# Patient Record
Sex: Female | Born: 1935 | Race: White | Hispanic: No | Smoking: Never smoker
Health system: Southern US, Community
[De-identification: ages and names within clinical notes are randomized; demographics above are authoritative.]

## PROBLEM LIST (undated history)

## (undated) DIAGNOSIS — I10 Essential (primary) hypertension: Secondary | ICD-10-CM

## (undated) DIAGNOSIS — Z87442 Personal history of urinary calculi: Secondary | ICD-10-CM

## (undated) DIAGNOSIS — K219 Gastro-esophageal reflux disease without esophagitis: Secondary | ICD-10-CM

## (undated) DIAGNOSIS — M1990 Unspecified osteoarthritis, unspecified site: Secondary | ICD-10-CM

## (undated) DIAGNOSIS — C801 Malignant (primary) neoplasm, unspecified: Secondary | ICD-10-CM

## (undated) HISTORY — PX: GALLBLADDER SURGERY: SHX652

## (undated) HISTORY — DX: Malignant (primary) neoplasm, unspecified: C80.1

## (undated) HISTORY — DX: Essential (primary) hypertension: I10

## (undated) HISTORY — PX: OTHER SURGICAL HISTORY: SHX169

## (undated) HISTORY — PX: ABDOMINAL HYSTERECTOMY: SHX81

## (undated) HISTORY — PX: HERNIA REPAIR: SHX51

## (undated) HISTORY — PX: CHOLECYSTECTOMY: SHX55

## (undated) HISTORY — DX: Gastro-esophageal reflux disease without esophagitis: K21.9

## (undated) HISTORY — DX: Unspecified osteoarthritis, unspecified site: M19.90

---

## 1988-01-19 HISTORY — PX: FRACTURE SURGERY: SHX138

## 1994-01-18 DIAGNOSIS — C801 Malignant (primary) neoplasm, unspecified: Secondary | ICD-10-CM

## 1994-01-18 HISTORY — DX: Malignant (primary) neoplasm, unspecified: C80.1

## 2004-09-24 ENCOUNTER — Ambulatory Visit: Payer: Self-pay | Admitting: Family Medicine

## 2004-09-30 ENCOUNTER — Ambulatory Visit: Payer: Self-pay | Admitting: Family Medicine

## 2004-10-14 ENCOUNTER — Ambulatory Visit: Payer: Self-pay | Admitting: Family Medicine

## 2005-01-01 ENCOUNTER — Ambulatory Visit: Payer: Self-pay | Admitting: Family Medicine

## 2005-01-04 ENCOUNTER — Ambulatory Visit: Payer: Self-pay | Admitting: Family Medicine

## 2005-03-08 ENCOUNTER — Ambulatory Visit: Payer: Self-pay | Admitting: Cardiology

## 2005-03-23 ENCOUNTER — Ambulatory Visit: Payer: Self-pay | Admitting: Family Medicine

## 2005-06-15 ENCOUNTER — Ambulatory Visit: Payer: Self-pay | Admitting: Family Medicine

## 2005-10-13 ENCOUNTER — Ambulatory Visit: Payer: Self-pay | Admitting: Family Medicine

## 2006-01-26 ENCOUNTER — Ambulatory Visit: Payer: Self-pay | Admitting: Family Medicine

## 2006-02-28 ENCOUNTER — Ambulatory Visit: Payer: Self-pay | Admitting: Family Medicine

## 2006-03-08 ENCOUNTER — Ambulatory Visit (HOSPITAL_COMMUNITY): Admission: RE | Admit: 2006-03-08 | Discharge: 2006-03-08 | Payer: Self-pay | Admitting: Family Medicine

## 2006-03-23 ENCOUNTER — Ambulatory Visit: Payer: Self-pay | Admitting: Family Medicine

## 2011-08-10 HISTORY — PX: OTHER SURGICAL HISTORY: SHX169

## 2011-08-11 ENCOUNTER — Encounter: Payer: Self-pay | Admitting: Cardiology

## 2011-08-26 ENCOUNTER — Encounter: Payer: Self-pay | Admitting: Cardiology

## 2011-08-27 ENCOUNTER — Ambulatory Visit: Payer: Self-pay | Admitting: Cardiology

## 2011-08-27 ENCOUNTER — Telehealth: Payer: Self-pay

## 2011-08-27 ENCOUNTER — Other Ambulatory Visit: Payer: Self-pay | Admitting: Cardiology

## 2011-08-27 ENCOUNTER — Encounter: Payer: Self-pay | Admitting: Cardiology

## 2011-08-27 ENCOUNTER — Encounter: Payer: Self-pay | Admitting: *Deleted

## 2011-08-27 ENCOUNTER — Ambulatory Visit (INDEPENDENT_AMBULATORY_CARE_PROVIDER_SITE_OTHER): Payer: Medicare Other | Admitting: Cardiology

## 2011-08-27 VITALS — BP 153/83 | HR 67 | Ht 65.0 in | Wt 220.0 lb

## 2011-08-27 DIAGNOSIS — R0602 Shortness of breath: Secondary | ICD-10-CM

## 2011-08-27 DIAGNOSIS — R079 Chest pain, unspecified: Secondary | ICD-10-CM

## 2011-08-27 DIAGNOSIS — R072 Precordial pain: Secondary | ICD-10-CM

## 2011-08-27 DIAGNOSIS — K219 Gastro-esophageal reflux disease without esophagitis: Secondary | ICD-10-CM

## 2011-08-27 DIAGNOSIS — I10 Essential (primary) hypertension: Secondary | ICD-10-CM

## 2011-08-27 NOTE — Assessment & Plan Note (Signed)
Blood pressure is elevated today. She reports compliance with her medications. Keep regular followup with Dr. Lysbeth Galas.

## 2011-08-27 NOTE — Telephone Encounter (Signed)
Message copied by Carmelina Paddock on Fri Aug 27, 2011  9:19 AM ------      Message from: Jonelle Sidle      Created: Fri Aug 27, 2011  9:16 AM      Regarding: RE: [name]       We can start with a plain treadmill and go from there.            ----- Message -----         From: Carmelina Paddock         Sent: 08/27/2011   9:11 AM           To: Jonelle Sidle, MD      Subject: [name]                                                  Needs MD review for myoview due to normal EKG.              They are suggesting a plain treadmill.            Case will close Monday.              [phone] option 4      Case # [id number]      Anna Bradford August 12, 2033            Thanks,            Morrie Sheldon

## 2011-08-27 NOTE — Assessment & Plan Note (Signed)
Described above, also associated with shortness of breath with activity. Symptoms have been present intermittently over the last month. There are both typical and atypical features, still concern for progressive angina. Her risk factors include a family history of premature CAD in her mother, also personal history of hypertension. She has not had any ischemic workup in the last 6 years. Her ECG today is normal. Plan will be to pursue an exercise Myoview and inform her of the results. No changes to medications at this point.

## 2011-08-27 NOTE — Telephone Encounter (Signed)
Message copied by Eustace Moore on Fri Aug 27, 2011 10:19 AM ------      Message from: Zachary George T      Created: Fri Aug 27, 2011  9:42 AM      Regarding: FWCeline Ahr       We need to change this order  And notify the patient.      ----- Message -----         From: Carmelina Paddock         Sent: 08/27/2011   9:18 AM           To: Megan Salon      Subject: FW: myoview                                              Can you change to a plain treadmill per Dr. Diona Browner?      I will update phone note as well            Thanks,            Morrie Sheldon            ----- Message -----         From: Jonelle Sidle, MD         Sent: 08/27/2011   9:16 AM           To: Carmelina Paddock      Subject: RE: [name]                                              We can start with a plain treadmill and go from there.            ----- Message -----         From: Carmelina Paddock         Sent: 08/27/2011   9:11 AM           To: Jonelle Sidle, MD      Subject: [name]                                                  Needs MD review for myoview due to normal EKG.              They are suggesting a plain treadmill.            Case will close Monday.              [phone] option 4      Case # [id number]      Monzerat Handler 05-16-2033            Thanks,            Morrie Sheldon

## 2011-08-27 NOTE — Telephone Encounter (Signed)
Rest Stress Myoview set for 08-31-2011 [facility] Checking percert

## 2011-08-27 NOTE — Progress Notes (Signed)
Clinical Summary Anna Bradford is a 78 y.o.female referred for cardiology consultation by Dr. Lysbeth Galas. She reports a history of recurring chest pain since July 15 when she woke up at night. She describes a feeling of tightness in her chest, radiation to the arms and neck, lasted for several minutes. Since then she has had recurrent symptoms to milder degree, also dyspnea on exertion. Still describes herself as being active, she babysits full time, also enjoys working out doors, doing light carpentry work, also plowing with a IT trainer.  Exercise Cardiolite from 2007 showed no diagnostic ST segment changes with inferior attenuation, no definite ischemia, LVEF 72%. She has had no ischemic testing over the last 6 years.  ECG today is normal. Main cardiac risk factors include family history of premature CAD in her mother, also hypertension. Some features of her symptoms seem suggestive of possible progressive reflux or esophageal spasm. She uses TUMS at home.   Allergies  Allergen Reactions  . Codeine Nausea And Vomiting  . Sulfa Antibiotics Nausea And Vomiting    Current Outpatient Prescriptions  Medication Sig Dispense Refill  . albuterol (PROAIR HFA) 108 (90 BASE) MCG/ACT inhaler Inhale 2 puffs into the lungs every 4 (four) hours as needed.      . Ascorbic Acid (VITAMIN C) 1000 MG tablet Take 1,000 mg by mouth daily.      Marland Kitchen Besifloxacin HCl (BESIVANCE) 0.6 % SUSP Place 1 drop into the left eye 2 (two) times daily.      . bimatoprost (LUMIGAN) 0.01 % SOLN Place 1 drop into the left eye at bedtime.      . brimonidine-timolol (COMBIGAN) 0.2-0.5 % ophthalmic solution Place 1 drop into the left eye every 12 (twelve) hours.      . Bromfenac Sodium (PROLENSA) 0.07 % SOLN Place 1 drop into the left eye daily.      . DiphenhydrAMINE HCl (BENADRYL ALLERGY PO) Take by mouth as needed.      Marland Kitchen ibuprofen (ADVIL,MOTRIN) 200 MG tablet Take 200 mg by mouth every 6 (six) hours as needed.      Marland Kitchen  losartan-hydrochlorothiazide (HYZAAR) 100-25 MG per tablet Take 1 tablet by mouth daily.      . Multiple Minerals-Vitamins (CALCIUM-MAGNESIUM-ZINC-D3 PO) Take 1 tablet by mouth daily.      . Multiple Vitamin (MULTIVITAMIN) tablet Take 1 tablet by mouth daily.      . potassium gluconate 595 MG TABS Take 595 mg by mouth daily.      . vitamin E 400 UNIT capsule Take 400 Units by mouth daily.        Past Medical History  Diagnosis Date  . Essential hypertension, benign   . GERD (gastroesophageal reflux disease)     Past Surgical History  Procedure Date  . Gallbladder surgery     Rupture Lower Stomach  . Hysterectomy ---- unknown   . Lapoma tumor   . Right ankle surgery   . Left breast masectomy   . Hernia repair   . Absess off intestine     Family History  Problem Relation Age of Onset  . Cancer Mother   . Coronary artery disease Mother   . Coronary artery disease Brother     Social History [name] reports that she has never smoked. She has never used smokeless tobacco. [name] reports that she does not drink alcohol.  Review of Systems No palpitations or syncope. No reported bleeding problems. Good appetite. Sometimes feels short of breath after eating. No orthopnea  or PND. Varicose veins. Had recent cataract surgery. Otherwise negative.  Physical Examination Filed Vitals:   08/27/11 0810  BP: 153/83  Pulse: 67   Overweight woman in no acute distress. HEENT: Conjunctiva and lids normal, oropharynx clear. Neck: Supple, increased girth, no elevated JVP or carotid bruits, no thyromegaly. Lungs: Clear to auscultation, nonlabored breathing at rest. Cardiac: Regular rate and rhythm, no S3, soft systolic murmur, no pericardial rub. Abdomen: Soft, nontender, protuberant, bowel sounds present, no guarding or rebound. Extremities: No pitting edema, varicose veins, distal pulses 2+. Skin: Warm and dry. Musculoskeletal: No kyphosis. Neuropsychiatric: Alert and oriented x3,  affect grossly appropriate.   ECG Reviewed in EMR.   Problem List and Plan   Precordial pain Described above, also associated with shortness of breath with activity. Symptoms have been present intermittently over the last month. There are both typical and atypical features, still concern for progressive angina. Her risk factors include a family history of premature CAD in her mother, also personal history of hypertension. She has not had any ischemic workup in the last 6 years. Her ECG today is normal. Plan will be to pursue an exercise Myoview and inform her of the results. No changes to medications at this point.  Essential hypertension, benign Blood pressure is elevated today. She reports compliance with her medications. Keep regular followup with Dr. Lysbeth Galas.  GERD (gastroesophageal reflux disease) History of reflux, uses TUMS. If stress testing is normal, may consider empiric trial of PPI for more aggressive acid suppression, in case some of her symptoms are related to esophagitis or spasm.    Jonelle Sidle, M.D., F.A.C.C.

## 2011-08-27 NOTE — Assessment & Plan Note (Signed)
History of reflux, uses TUMS. If stress testing is normal, may consider empiric trial of PPI for more aggressive acid suppression, in case some of her symptoms are related to esophagitis or spasm.

## 2011-08-27 NOTE — Patient Instructions (Addendum)
Your physician has requested that you have en exercise stress myoview. For further information please visit [email or web address]. Please follow instruction sheet, as given. Your physician recommends that you continue on your current medications as directed. Please refer to the Current Medication list given to you today. We will call you with your results. 

## 2011-08-27 NOTE — Telephone Encounter (Signed)
I sent a staff message to Dr. Diona Browner.  [employer] is suggesting a plain treadmill due to her normal EKG.   Waiting to hear back to see what he would like to do.

## 2011-08-27 NOTE — Telephone Encounter (Signed)
@  10:19 am message left on voicemail of patient cell phone that order changed due to insurance request.

## 2011-08-31 DIAGNOSIS — R079 Chest pain, unspecified: Secondary | ICD-10-CM

## 2011-09-03 ENCOUNTER — Telehealth: Payer: Self-pay | Admitting: *Deleted

## 2011-09-03 NOTE — Telephone Encounter (Signed)
Message copied by Eustace Moore on Fri Sep 03, 2011 11:25 AM ------      Message from: MCDOWELL, Illene Bolus      Created: Wed Sep 01, 2011  9:12 PM       Reviewed report. She had very limited exercise tolerance, but no definite ischemic ST change at 85% MPHR. Let's have her try a PPI such as Prilosec or Protonix emperically, but keep a followup visit with me in the next 2-4 weeks. I am concerned that if her symptoms continue, we may need to pursue further cardiac evaluation anyway.

## 2011-09-03 NOTE — Telephone Encounter (Signed)
Left message for patient to call office. Spoke with a female.

## 2011-09-03 NOTE — Telephone Encounter (Signed)
Patient informed and is going to start omeprazole 20 mg daily OTC and f/u on 09/28/11.

## 2011-09-28 ENCOUNTER — Ambulatory Visit: Payer: Medicare Other | Admitting: Cardiology

## 2011-11-01 HISTORY — PX: OTHER SURGICAL HISTORY: SHX169

## 2013-01-10 ENCOUNTER — Encounter: Payer: Self-pay | Admitting: Cardiology

## 2013-01-15 ENCOUNTER — Ambulatory Visit: Payer: Medicare Other | Admitting: Cardiology

## 2013-01-15 ENCOUNTER — Other Ambulatory Visit: Payer: Self-pay | Admitting: *Deleted

## 2013-01-15 ENCOUNTER — Ambulatory Visit (INDEPENDENT_AMBULATORY_CARE_PROVIDER_SITE_OTHER): Payer: Medicare Other | Admitting: Cardiology

## 2013-01-15 VITALS — BP 149/76 | HR 77 | Ht 65.0 in | Wt 221.8 lb

## 2013-01-15 DIAGNOSIS — I10 Essential (primary) hypertension: Secondary | ICD-10-CM

## 2013-01-15 DIAGNOSIS — R0789 Other chest pain: Secondary | ICD-10-CM

## 2013-01-15 DIAGNOSIS — I83893 Varicose veins of bilateral lower extremities with other complications: Secondary | ICD-10-CM

## 2013-01-15 NOTE — Progress Notes (Signed)
Clinical Summary Anna Bradford is a 79 y.o.female previously seen by Dr Diona Browner, this is our first visit together. She was seen for the following medical problems.  1. Chest pain - exercise cardiolite 2007 without definite ischemia, LVEF 72% - exercise stress test 08/2011 patient exercised 1 min 32 seconds, 4.6 METs, no ischemic EKG changes but did have decreased exercise tolerance per report. She states the test was stopped because she reached her target heart rate, not due to fatigue. - feeling of occasional pressure in abdomen, soreness in abdomen. No cardiac like chest pain. Describes DOE walking back and forth to mailbox which is approx 100 feet, stable for 6 months. No orthopnea, no LE edema.   2. HTN - does not check at home - compliant with hyzaar   Past Medical History  Diagnosis Date  . Essential hypertension, benign   . GERD (gastroesophageal reflux disease)      Allergies  Allergen Reactions  . Codeine Nausea And Vomiting  . Sulfa Antibiotics Nausea And Vomiting     Current Outpatient Prescriptions  Medication Sig Dispense Refill  . albuterol (PROAIR HFA) 108 (90 BASE) MCG/ACT inhaler Inhale 2 puffs into the lungs every 4 (four) hours as needed.      . Ascorbic Acid (VITAMIN C) 1000 MG tablet Take 1,000 mg by mouth daily.      Marland Kitchen Besifloxacin HCl (BESIVANCE) 0.6 % SUSP Place 1 drop into the left eye 2 (two) times daily.      . bimatoprost (LUMIGAN) 0.01 % SOLN Place 1 drop into the left eye at bedtime.      . brimonidine-timolol (COMBIGAN) 0.2-0.5 % ophthalmic solution Place 1 drop into the left eye every 12 (twelve) hours.      . Bromfenac Sodium (PROLENSA) 0.07 % SOLN Place 1 drop into the left eye daily.      . DiphenhydrAMINE HCl (BENADRYL ALLERGY PO) Take by mouth as needed.      Marland Kitchen ibuprofen (ADVIL,MOTRIN) 200 MG tablet Take 200 mg by mouth every 6 (six) hours as needed.      Marland Kitchen losartan-hydrochlorothiazide (HYZAAR) 100-25 MG per tablet Take 1 tablet by mouth  daily.      . Multiple Minerals-Vitamins (CALCIUM-MAGNESIUM-ZINC-D3 PO) Take 1 tablet by mouth daily.      . Multiple Vitamin (MULTIVITAMIN) tablet Take 1 tablet by mouth daily.      Marland Kitchen omeprazole (PRILOSEC) 20 MG capsule Take 20 mg by mouth daily.      . potassium gluconate 595 MG TABS Take 595 mg by mouth daily.      . vitamin E 400 UNIT capsule Take 400 Units by mouth daily.       No current facility-administered medications for this visit.     Past Surgical History  Procedure Laterality Date  . Gallbladder surgery      Rupture Lower Stomach  . Hysterectomy ---- unknown    . Lapoma tumor    . Right ankle surgery    . Left breast masectomy    . Hernia repair    . Absess off intestine       Allergies  Allergen Reactions  . Codeine Nausea And Vomiting  . Sulfa Antibiotics Nausea And Vomiting      Family History  Problem Relation Age of Onset  . Cancer Mother   . Coronary artery disease Mother   . Coronary artery disease Brother      Social History Ms. Camargo reports that she has never smoked.  She has never used smokeless tobacco. Ms. Parillo reports that she does not drink alcohol.   Review of Systems CONSTITUTIONAL: No weight loss, fever, chills, weakness or fatigue.  HEENT: Eyes: No visual loss, blurred vision, double vision or yellow sclerae.No hearing loss, sneezing, congestion, runny nose or sore throat.  SKIN: No rash or itching.  CARDIOVASCULAR: per HPI RESPIRATORY: No shortness of breath, cough or sputum.  GASTROINTESTINAL: occas abdominal pain GENITOURINARY: No burning on urination, no polyuria NEUROLOGICAL: No headache, dizziness, syncope, paralysis, ataxia, numbness or tingling in the extremities. No change in bowel or bladder control.  MUSCULOSKELETAL: No muscle, back pain, joint pain or stiffness.  LYMPHATICS: No enlarged nodes. No history of splenectomy.  PSYCHIATRIC: No history of depression or anxiety.  ENDOCRINOLOGIC: No reports of sweating, cold or  heat intolerance. No polyuria or polydipsia.  Marland Kitchen   Physical Examination p 77 bp 145/70 Wt 221 lbs BMI 37 Gen: resting comfortably, no acute distress HEENT: no scleral icterus, pupils equal round and reactive, no palptable cervical adenopathy,  CV: RRR, no m/r/g, no JVD, no carotid bruits Resp: Clear to auscultation bilaterally GI: abdomen is soft, non-tender, non-distended, normal bowel sounds, no hepatosplenomegaly MSK: extremities are warm, no edema.  Skin: warm, no rash Neuro:  no focal deficits Psych: appropriate affect   Diagnostic Studies 08/2011 GXT: 1 min 32 sec, stage 1, 4.6 METs, no ischemic changes    Assessment and Plan  1. Atypical chest pain - negative exercise stress test recently. Symptoms more consistent with GI etiology - continue cardiac risk factor modification  2. HTN - blood pressure at reasonable target today give her age and recent guidelines for bp control in the elderly, she was counseled on further lifestyle modification including sodium limitation and she was given information on the DASH diet. - follow up bp at future visits.    Follow up 1 year   Antoine Poche, M.D., F.A.C.C.

## 2013-01-15 NOTE — Patient Instructions (Signed)
Your physician recommends that you schedule a follow-up appointment in: 1 year with Dr. Wyline Mood. You should receive a letter in the mail in 10 months. If you do not receive this letter by October 2015 call our office to schedule this appointment.   Your physician recommends that you continue on your current medications as directed. Please refer to the Current Medication list given to you today.  Your physician has recommended you try the DASH Diet.

## 2013-03-06 ENCOUNTER — Encounter: Payer: Medicare Other | Admitting: Vascular Surgery

## 2013-03-06 ENCOUNTER — Encounter (HOSPITAL_COMMUNITY): Payer: Medicare Other

## 2013-03-16 ENCOUNTER — Encounter: Payer: Self-pay | Admitting: Vascular Surgery

## 2013-03-19 ENCOUNTER — Ambulatory Visit (INDEPENDENT_AMBULATORY_CARE_PROVIDER_SITE_OTHER): Payer: Medicare Other | Admitting: Vascular Surgery

## 2013-03-19 ENCOUNTER — Ambulatory Visit (HOSPITAL_COMMUNITY)
Admission: RE | Admit: 2013-03-19 | Discharge: 2013-03-19 | Disposition: A | Discharge status: Home / Self Care | Payer: Medicare Other | Source: Ambulatory Visit | Attending: Vascular Surgery | Admitting: Vascular Surgery

## 2013-03-19 ENCOUNTER — Encounter: Payer: Self-pay | Admitting: Vascular Surgery

## 2013-03-19 VITALS — BP 153/84 | HR 92 | Resp 18 | Ht 65.0 in | Wt 225.1 lb

## 2013-03-19 DIAGNOSIS — I83893 Varicose veins of bilateral lower extremities with other complications: Secondary | ICD-10-CM | POA: Insufficient documentation

## 2013-03-19 NOTE — Progress Notes (Signed)
Subjective:     Patient ID: Anna Bradford, female   DOB: [birth year], 80 y.o.   MRN: [id number]  HPI this 80 year old female who is evaluated for possible venous insufficiency of the right leg. She has been having increasing discomfort from the knee to the ankle particularly in the pretibial region over the last several months. She has some prominent veins. She has no history of DVT, thrombophlebitis, stasis ulcers, bleeding, or chronic swelling. She does wear "support hose.'s". She states that the pain is worse while lying or at night that is while ambulating or standing.  Past Medical History  Diagnosis Date  . Essential hypertension, benign   . GERD (gastroesophageal reflux disease)   . Cancer 1996    left breast    History  Substance Use Topics  . Smoking status: Never Smoker   . Smokeless tobacco: Never Used  . Alcohol Use: No    Family History  Problem Relation Age of Onset  . Cancer Mother   . Coronary artery disease Mother   . Coronary artery disease Brother     Allergies  Allergen Reactions  . Codeine Nausea And Vomiting  . Sulfa Antibiotics Nausea And Vomiting    Current outpatient prescriptions:acetaminophen (TYLENOL) 650 MG CR tablet, Take 650 mg by mouth every 8 (eight) hours as needed for pain., Disp: , Rfl: ;  albuterol (PROAIR HFA) 108 (90 BASE) MCG/ACT inhaler, Inhale 2 puffs into the lungs every 4 (four) hours as needed., Disp: , Rfl: ;  Ascorbic Acid (VITAMIN C) 1000 MG tablet, Take 1,000 mg by mouth daily., Disp: , Rfl: ;  aspirin 81 MG tablet, Take 81 mg by mouth daily., Disp: , Rfl:  Cholecalciferol (VITAMIN D-3) 1000 UNITS CAPS, Take 2,000 Units by mouth daily., Disp: , Rfl: ;  DiphenhydrAMINE HCl (BENADRYL ALLERGY PO), Take by mouth as needed., Disp: , Rfl: ;  Krill Oil 300 MG CAPS, Take 300 mg by mouth daily., Disp: , Rfl: ;  losartan-hydrochlorothiazide (HYZAAR) 100-25 MG per tablet, Take 1 tablet by mouth daily., Disp: , Rfl:  Multiple Minerals-Vitamins  (CALCIUM-MAGNESIUM-ZINC-D3 PO), Take 1 tablet by mouth daily., Disp: , Rfl: ;  potassium gluconate 595 MG TABS, Take 595 mg by mouth daily., Disp: , Rfl: ;  Multiple Vitamin (MULTIVITAMIN) tablet, Take 1 tablet by mouth daily., Disp: , Rfl: ;  zinc gluconate 50 MG tablet, Take 50 mg by mouth daily., Disp: , Rfl:   BP 153/84  Pulse 92  Resp 18  Ht 5\' 5"  (1.651 m)  Wt 225 lb 1.6 oz (102.105 kg)  BMI 37.46 kg/m2  Body mass index is 37.46 kg/(m^2).           Review of Systems denies chest pain, hemoptysis, PND, orthopnea, claudication. Does complain of dyspnea on exertion but denies wheezing. Does complain of bilateral neck discomfort. Other systems negative and a complete review of systems other than  reflux esophagitis    Objective:   Physical Exam BP 153/84  Pulse 92  Resp 18  Ht 5\' 5"  (1.651 m)  Wt 225 lb 1.6 oz (102.105 kg)  BMI 37.46 kg/m2  Gen.-alert and oriented x3 in no apparent distress HEENT normal for age Lungs no rhonchi or wheezing Cardiovascular regular rhythm no murmurs carotid pulses 3+ palpable no bruits audible Abdomen soft nontender no palpable masses-obese  Musculoskeletal free of  major deformities Skin clear -no rashes Neurologic normal Lower extremities 3+ femoral and dorsalis pedis pulses palpable bilaterally with no edema Right leg with prominent  veins in the pretibial region but no edema or hyperpigmentation or bulging varicosities noted. No spider findings same.  Today I ordered venous duplex exam of the right leg which are reviewed and interpreted. There is no DVT. There is reflux in the right great saphenous vein and there is an incompetent perforator in the mid thigh. The vein was visualized by me with the sono site and does not appear of large caliber at this point in time-despite the fact that there is reflux.       Assessment:     Right leg pain-etiology unknown   Mild reflux right great saphenous system with borderline-sized vein-no  DVT    Plan:     1 short-leg elastic compression stockings 20-30 mm gradient #2 do not think that venous insufficiency is severe enough to be causing her symptoms at the present time-recommend conservative management

## 2013-04-03 ENCOUNTER — Encounter (HOSPITAL_COMMUNITY): Payer: Medicare Other

## 2013-04-03 ENCOUNTER — Encounter: Payer: Medicare Other | Admitting: Vascular Surgery

## 2014-04-17 ENCOUNTER — Telehealth: Payer: Self-pay | Admitting: Cardiology

## 2014-04-17 NOTE — Telephone Encounter (Signed)
Numerous attempts to contact patient with recall letters with no success.   [name] [[id number]] 01/15/2013 9:21 AM New [10] [System] 09/18/2013 11:00 PM Notification Sent [20] [name] [[id number]] 02/15/2014 11:45 AM Notification Sent [20] [name] [[id number]] 02/20/2014 8:52 AM Notification Sent [20] [name] [[id number]] 03/20/2014 3:18 PM Notification Sent [20]

## 2014-09-17 ENCOUNTER — Other Ambulatory Visit (HOSPITAL_COMMUNITY): Payer: Self-pay | Admitting: Adult Health Nurse Practitioner

## 2014-09-17 ENCOUNTER — Ambulatory Visit (HOSPITAL_COMMUNITY)
Admission: RE | Admit: 2014-09-17 | Discharge: 2014-09-17 | Disposition: A | Discharge status: Home / Self Care | Payer: Medicare Other | Source: Ambulatory Visit | Attending: Adult Health Nurse Practitioner | Admitting: Adult Health Nurse Practitioner

## 2014-09-17 DIAGNOSIS — M4183 Other forms of scoliosis, cervicothoracic region: Secondary | ICD-10-CM | POA: Insufficient documentation

## 2014-09-17 DIAGNOSIS — M5031 Other cervical disc degeneration,  high cervical region: Secondary | ICD-10-CM | POA: Insufficient documentation

## 2014-09-17 DIAGNOSIS — M542 Cervicalgia: Secondary | ICD-10-CM

## 2014-12-17 ENCOUNTER — Encounter (INDEPENDENT_AMBULATORY_CARE_PROVIDER_SITE_OTHER): Payer: Self-pay | Admitting: *Deleted

## 2015-01-23 ENCOUNTER — Encounter (INDEPENDENT_AMBULATORY_CARE_PROVIDER_SITE_OTHER): Payer: Self-pay | Admitting: Internal Medicine

## 2015-01-23 ENCOUNTER — Ambulatory Visit (INDEPENDENT_AMBULATORY_CARE_PROVIDER_SITE_OTHER): Payer: Medicare Other | Admitting: Internal Medicine

## 2015-01-23 ENCOUNTER — Encounter (INDEPENDENT_AMBULATORY_CARE_PROVIDER_SITE_OTHER): Payer: Self-pay | Admitting: *Deleted

## 2015-01-23 ENCOUNTER — Other Ambulatory Visit (INDEPENDENT_AMBULATORY_CARE_PROVIDER_SITE_OTHER): Payer: Self-pay | Admitting: Internal Medicine

## 2015-01-23 VITALS — BP 156/90 | HR 76 | Temp 97.8°F | Ht 65.0 in | Wt 225.4 lb

## 2015-01-23 DIAGNOSIS — Z1211 Encounter for screening for malignant neoplasm of colon: Secondary | ICD-10-CM

## 2015-01-23 NOTE — Patient Instructions (Signed)
Screening colonoscopy 

## 2015-01-23 NOTE — Progress Notes (Signed)
   Subjective:    Patient ID: Anna Bradford, female    DOB: 12-13-1933, 82 y.o.   MRN: [id number]  HPI Referred by Dr. Edrick Oh for a colonoscopy. She had a colonoscopy years ago at [facility] greater than 10 years. She says she had polyps.  Her appetite is good. No weight loss. No dysphagia. There is no abdominal pain.  No acid reflux. She says a couple of days ago her back and lower abdomen/back hurt and she took a laxative and pain resolved. She usually has a BM daily,sometimes twice. Occasionally has seen blood over the years but rarely.    12/10/2014 WBC 6.7, H and H 14.1 and 40.2, MCV 90, Platelet ct 267, total bili 0.7, ALP 76, AST 36, ALT    Review of Systems Past Medical History  Diagnosis Date  . Essential hypertension, benign   . GERD (gastroesophageal reflux disease)   . Cancer [facility]) 1996    left breast    Past Surgical History  Procedure Laterality Date  . Gallbladder surgery      Rupture Lower Stomach  . Hysterectomy ---- unknown      1980  . Lapoma tumor    . Right ankle surgery    . Left breast masectomy      1986  . Hernia repair    . Absess off intestine    . Cataract surgery Left 08-10-2011  . Cataract surgery Right 11-01-2011  . Cholecystectomy      1972    Allergies  Allergen Reactions  . Macrolides And Ketolides     Bleeding thru bowels  . Codeine Nausea And Vomiting  . Sulfa Antibiotics Nausea And Vomiting    Current Outpatient Prescriptions on File Prior to Visit  Medication Sig Dispense Refill  . Ascorbic Acid (VITAMIN C) 1000 MG tablet Take 1,000 mg by mouth daily. Reported on 01/23/2015    . aspirin 81 MG tablet Take 81 mg by mouth daily.    . Cholecalciferol (VITAMIN D-3) 1000 UNITS CAPS Take 2,000 Units by mouth daily.    . DiphenhydrAMINE HCl (BENADRYL ALLERGY PO) Take by mouth as needed.    Javier Docker Oil 300 MG CAPS Take 300 mg by mouth daily.    Marland Kitchen losartan-hydrochlorothiazide (HYZAAR) 100-25 MG per tablet Take 1 tablet by mouth  daily.    . Multiple Minerals-Vitamins (CALCIUM-MAGNESIUM-ZINC-D3 PO) Take 1 tablet by mouth daily.    . Multiple Vitamin (MULTIVITAMIN) tablet Take 1 tablet by mouth daily.    . potassium gluconate 595 MG TABS Take 595 mg by mouth daily.    Marland Kitchen zinc gluconate 50 MG tablet Take 50 mg by mouth daily.     No current facility-administered medications on file prior to visit.        Objective:   Physical Exam Blood pressure 156/90, pulse 76, temperature 97.8 F (36.6 C), height 5\' 5"  (1.651 m), weight 225 lb 6.4 oz (102.241 kg). Alert and oriented. Skin warm and dry. Oral mucosa is moist.   . Sclera anicteric, conjunctivae is pink. Thyroid not enlarged. No cervical lymphadenopathy. Lungs clear. Heart regular rate and rhythm.  Abdomen is soft. Bowel sounds are positive. No hepatomegaly. No abdominal masses felt. No tenderness.  No edema to lower extremities.          Assessment & Plan:  Screening colonoscopy. Last colonoscopy greater than 10 years ago.  Colonic neoplasm, polyp needs to be ruled out.

## 2015-02-06 ENCOUNTER — Encounter (HOSPITAL_COMMUNITY): Payer: Self-pay

## 2015-02-06 ENCOUNTER — Encounter (HOSPITAL_COMMUNITY)
Admission: RE | Disposition: A | Discharge status: Home / Self Care | Payer: Self-pay | Source: Ambulatory Visit | Attending: Internal Medicine

## 2015-02-06 ENCOUNTER — Ambulatory Visit (HOSPITAL_COMMUNITY)
Admission: RE | Admit: 2015-02-06 | Discharge: 2015-02-06 | Disposition: A | Discharge status: Home / Self Care | Payer: Medicare Other | Source: Ambulatory Visit | Attending: Internal Medicine | Admitting: Internal Medicine

## 2015-02-06 DIAGNOSIS — I10 Essential (primary) hypertension: Secondary | ICD-10-CM | POA: Diagnosis not present

## 2015-02-06 DIAGNOSIS — Z79899 Other long term (current) drug therapy: Secondary | ICD-10-CM | POA: Insufficient documentation

## 2015-02-06 DIAGNOSIS — D123 Benign neoplasm of transverse colon: Secondary | ICD-10-CM | POA: Diagnosis not present

## 2015-02-06 DIAGNOSIS — K219 Gastro-esophageal reflux disease without esophagitis: Secondary | ICD-10-CM | POA: Diagnosis not present

## 2015-02-06 DIAGNOSIS — Z90710 Acquired absence of both cervix and uterus: Secondary | ICD-10-CM | POA: Insufficient documentation

## 2015-02-06 DIAGNOSIS — Z881 Allergy status to other antibiotic agents status: Secondary | ICD-10-CM | POA: Insufficient documentation

## 2015-02-06 DIAGNOSIS — Z885 Allergy status to narcotic agent status: Secondary | ICD-10-CM | POA: Insufficient documentation

## 2015-02-06 DIAGNOSIS — Z853 Personal history of malignant neoplasm of breast: Secondary | ICD-10-CM | POA: Insufficient documentation

## 2015-02-06 DIAGNOSIS — Z9012 Acquired absence of left breast and nipple: Secondary | ICD-10-CM | POA: Diagnosis not present

## 2015-02-06 DIAGNOSIS — Z882 Allergy status to sulfonamides status: Secondary | ICD-10-CM | POA: Diagnosis not present

## 2015-02-06 DIAGNOSIS — Z7982 Long term (current) use of aspirin: Secondary | ICD-10-CM | POA: Insufficient documentation

## 2015-02-06 DIAGNOSIS — Z1211 Encounter for screening for malignant neoplasm of colon: Secondary | ICD-10-CM | POA: Diagnosis not present

## 2015-02-06 DIAGNOSIS — Z9049 Acquired absence of other specified parts of digestive tract: Secondary | ICD-10-CM | POA: Insufficient documentation

## 2015-02-06 DIAGNOSIS — K5730 Diverticulosis of large intestine without perforation or abscess without bleeding: Secondary | ICD-10-CM | POA: Diagnosis not present

## 2015-02-06 DIAGNOSIS — K635 Polyp of colon: Secondary | ICD-10-CM | POA: Diagnosis not present

## 2015-02-06 DIAGNOSIS — D120 Benign neoplasm of cecum: Secondary | ICD-10-CM | POA: Diagnosis not present

## 2015-02-06 HISTORY — PX: COLONOSCOPY: SHX5424

## 2015-02-06 SURGERY — COLONOSCOPY
Anesthesia: Moderate Sedation

## 2015-02-06 MED ORDER — SODIUM CHLORIDE 0.9 % IV SOLN
INTRAVENOUS | Status: DC
Start: 2015-02-06 — End: 2015-02-06
  Administered 2015-02-06: 09:00:00 via INTRAVENOUS

## 2015-02-06 MED ORDER — MEPERIDINE HCL 50 MG/ML IJ SOLN
INTRAMUSCULAR | Status: DC | PRN
  Administered 2015-02-06 (×2): 25 mg via INTRAVENOUS

## 2015-02-06 MED ORDER — MIDAZOLAM HCL 5 MG/5ML IJ SOLN
INTRAMUSCULAR | Status: DC | PRN
  Administered 2015-02-06 (×3): 1 mg via INTRAVENOUS
  Administered 2015-02-06: 2 mg via INTRAVENOUS

## 2015-02-06 MED ORDER — MEPERIDINE HCL 50 MG/ML IJ SOLN
INTRAMUSCULAR | Status: AC
  Filled 2015-02-06: qty 1

## 2015-02-06 MED ORDER — STERILE WATER FOR IRRIGATION IR SOLN
Status: DC | PRN
  Administered 2015-02-06: 10:00:00

## 2015-02-06 MED ORDER — MIDAZOLAM HCL 5 MG/5ML IJ SOLN
INTRAMUSCULAR | Status: AC
  Filled 2015-02-06: qty 10

## 2015-02-06 NOTE — H&P (Signed)
Anna Bradford is an 82 y.o. female.   Chief Complaint: Patient is here for colonoscopy. HPI: Patient is 22 -year-old Caucasian female was history of colonic adenomas and is here for surveillance colonoscopy. Her last exam was over 10 years ago and she decided to wait until now. She has intermittent hematochezia usually once every 3 months. She denies diarrhea constipation or abdominal pain. Family history is negative for CRC.Marland Kitchen  Past Medical History  Diagnosis Date  . Essential hypertension, benign   . GERD (gastroesophageal reflux disease)   . Cancer [facility]) 1996    left breast    Past Surgical History  Procedure Laterality Date  . Gallbladder surgery      Rupture Lower Stomach  . Hysterectomy ---- unknown      1980  . Lapoma tumor    . Right ankle surgery    . Left breast masectomy      1986  . Hernia repair    . Absess off intestine    . Cataract surgery Left 08-10-2011  . Cataract surgery Right 11-01-2011  . Cholecystectomy      1972    Family History  Problem Relation Age of Onset  . Cancer Mother   . Coronary artery disease Mother    Social History:  reports that she has never smoked. She has never used smokeless tobacco. She reports that she does not drink alcohol or use illicit drugs.  Allergies:  Allergies  Allergen Reactions  . Macrolides And Ketolides     Bleeding thru bowels  . Codeine Nausea And Vomiting  . Sulfa Antibiotics Nausea And Vomiting    Medications Prior to Admission  Medication Sig Dispense Refill  . amLODipine (NORVASC) 5 MG tablet Take 5 mg by mouth.    . Ascorbic Acid (VITAMIN C) 1000 MG tablet Take 1,000 mg by mouth daily. Reported on 01/23/2015    . aspirin 81 MG tablet Take 81 mg by mouth daily.    . Cholecalciferol (VITAMIN D-3) 1000 UNITS CAPS Take 2,000 Units by mouth daily.    . DiphenhydrAMINE HCl (BENADRYL ALLERGY PO) Take by mouth as needed.    Marland Kitchen ibuprofen (ADVIL,MOTRIN) 200 MG tablet Take 200 mg by mouth. One in am and one in pm     . Krill Oil 300 MG CAPS Take 300 mg by mouth daily.    Marland Kitchen losartan-hydrochlorothiazide (HYZAAR) 100-25 MG per tablet Take 1 tablet by mouth daily.    . Multiple Minerals-Vitamins (CALCIUM-MAGNESIUM-ZINC-D3 PO) Take 1 tablet by mouth daily.    . Multiple Vitamin (MULTIVITAMIN) tablet Take 1 tablet by mouth daily.    . potassium gluconate 595 MG TABS Take 595 mg by mouth daily.    Marland Kitchen zinc gluconate 50 MG tablet Take 50 mg by mouth daily.      No results found for this or any previous visit (from the past 48 hour(s)). No results found.  ROS  Blood pressure 151/84, pulse 86, temperature 98.5 F (36.9 C), temperature source Oral, resp. rate 18, SpO2 96 %. Physical Exam  Constitutional: She appears well-developed and well-nourished.  HENT:  Mouth/Throat: Oropharynx is clear and moist.  Eyes: Conjunctivae are normal. No scleral icterus.  Neck: No thyromegaly present.  Cardiovascular: Normal rate, regular rhythm and normal heart sounds.   No murmur heard. Respiratory: Effort normal and breath sounds normal.  GI: Soft. She exhibits no distension and no mass. There is no tenderness.  Small subcutaneous lipoma at right mid abdomen lateral to cholecystectomy scar  Musculoskeletal: She exhibits no edema.  Lymphadenopathy:    She has no cervical adenopathy.  Neurological: She is alert.  Skin: Skin is warm and dry.     Assessment/Plan History of colonic adenomas. Surveillance colonoscopy.  REHMAN,NAJEEB U 02/06/2015, 10:06 AM

## 2015-02-06 NOTE — Discharge Instructions (Signed)
Resume usual medications and high fiber diet. No driving for 24 hours. Physician will call with biopsy results.    High-Fiber Diet Fiber, also called dietary fiber, is a type of carbohydrate found in fruits, vegetables, whole grains, and beans. A high-fiber diet can have many health benefits. Your health care provider may recommend a high-fiber diet to help:  Prevent constipation. Fiber can make your bowel movements more regular.  Lower your cholesterol.  Relieve hemorrhoids, uncomplicated diverticulosis, or irritable bowel syndrome.  Prevent overeating as part of a weight-loss plan.  Prevent heart disease, type 2 diabetes, and certain cancers. WHAT IS MY PLAN? The recommended daily intake of fiber includes:  38 grams for men under age 59.  62 grams for men over age 49.  36 grams for women under age 85.  41 grams for women over age 23. You can get the recommended daily intake of dietary fiber by eating a variety of fruits, vegetables, grains, and beans. Your health care provider may also recommend a fiber supplement if it is not possible to get enough fiber through your diet. WHAT DO I NEED TO KNOW ABOUT A HIGH-FIBER DIET?  Fiber supplements have not been widely studied for their effectiveness, so it is better to get fiber through food sources.  Always check the fiber content on thenutrition facts label of any prepackaged food. Look for foods that contain at least 5 grams of fiber per serving.  Ask your dietitian if you have questions about specific foods that are related to your condition, especially if those foods are not listed in the following section.  Increase your daily fiber consumption gradually. Increasing your intake of dietary fiber too quickly may cause bloating, cramping, or gas.  Drink plenty of water. Water helps you to digest fiber. WHAT FOODS CAN I EAT? Grains Whole-grain breads. Multigrain cereal. Oats and oatmeal. Brown rice. Barley. Bulgur wheat.  [place]. Bran muffins. Popcorn. Rye wafer crackers. Vegetables Sweet potatoes. Spinach. Kale. Artichokes. Cabbage. Broccoli. Green peas. Carrots. Squash. Fruits Berries. Pears. Apples. Oranges. Avocados. Prunes and raisins. Dried figs. Meats and Other Protein Sources Navy, kidney, pinto, and soy beans. Split peas. Lentils. Nuts and seeds. Dairy Fiber-fortified yogurt. Beverages Fiber-fortified soy milk. Fiber-fortified orange juice. Other Fiber bars. The items listed above may not be a complete list of recommended foods or beverages. Contact your dietitian for more options. WHAT FOODS ARE NOT RECOMMENDED? Grains White bread. Pasta made with refined flour. White rice. Vegetables Fried potatoes. Canned vegetables. Well-cooked vegetables.  Fruits Fruit juice. Cooked, strained fruit. Meats and Other Protein Sources Fatty cuts of meat. Fried Sales executive or fried fish. Dairy Milk. Yogurt. Cream cheese. Sour cream. Beverages Soft drinks. Other Cakes and pastries. Butter and oils. The items listed above may not be a complete list of foods and beverages to avoid. Contact your dietitian for more information. WHAT ARE SOME TIPS FOR INCLUDING HIGH-FIBER FOODS IN MY DIET?  Eat a wide variety of high-fiber foods.  Make sure that half of all grains consumed each day are whole grains.  Replace breads and cereals made from refined flour or white flour with whole-grain breads and cereals.  Replace white rice with brown rice, bulgur wheat, or millet.  Start the day with a breakfast that is high in fiber, such as a cereal that contains at least 5 grams of fiber per serving.  Use beans in place of meat in soups, salads, or pasta.  Eat high-fiber snacks, such as berries, raw vegetables, nuts, or popcorn.  This information is not intended to replace advice given to you by your health care provider. Make sure you discuss any questions you have with your health care provider.   Document Released:  01/04/2005 Document Revised: 01/25/2014 Document Reviewed: 06/19/2013 Elsevier Interactive Patient Education 2016 [employer]. Colonoscopy, Care After These instructions give you information on caring for yourself after your procedure. Your doctor may also give you more specific instructions. Call your doctor if you have any problems or questions after your procedure. HOME CARE  Do not drive for 24 hours.  Do not sign important papers or use machinery for 24 hours.  You may shower.  You may go back to your usual activities, but go slower for the first 24 hours.  Take rest breaks often during the first 24 hours.  Walk around or use warm packs on your belly (abdomen) if you have belly cramping or gas.  Drink enough fluids to keep your pee (urine) clear or pale yellow.  Resume your normal diet. Avoid heavy or fried foods.  Avoid drinking alcohol for 24 hours or as told by your doctor.  Only take medicines as told by your doctor. If a tissue sample (biopsy) was taken during the procedure:   Do not take aspirin or blood thinners for 7 days, or as told by your doctor.  Do not drink alcohol for 7 days, or as told by your doctor.  Eat soft foods for the first 24 hours. GET HELP IF: You still have a small amount of blood in your poop (stool) 2-3 days after the procedure. GET HELP RIGHT AWAY IF:  You have more than a small amount of blood in your poop.  You see clumps of tissue (blood clots) in your poop.  Your belly is puffy (swollen).  You feel sick to your stomach (nauseous) or throw up (vomit).  You have a fever.  You have belly pain that gets worse and medicine does not help. MAKE SURE YOU:  Understand these instructions.  Will watch your condition.  Will get help right away if you are not doing well or get worse.   This information is not intended to replace advice given to you by your health care provider. Make sure you discuss any questions you have with your  health care provider.   Document Released: 02/06/2010 Document Revised: 01/09/2013 Document Reviewed: 09/11/2012 Elsevier Interactive Patient Education [employer].

## 2015-02-06 NOTE — Op Note (Addendum)
COLONOSCOPY PROCEDURE REPORT  PATIENT:  Anna Bradford  MR#:  [id number] Birthdate:  1933-11-24, 82 y.o., female Endoscopist:  Dr. Rogene Houston, MD Referred By:  Dr. Sherrie Mustache, MD Procedure Date: 02/06/2015  Procedure:   Colonoscopy  Indications: Patient is 82 year old Caucasian female was history of colonic polyps and is here for surveillance colonoscopy.  Informed Consent:  The procedure and risks were reviewed with the patient and informed consent was obtained.  Medications:  Demerol 50 mg IV Versed 5 mg IV  First dose administered at 10:15 AM Last dose administered at 10:23 AM Scope out 10:51 AM  Description of procedure:  After a digital rectal exam was performed, that colonoscope was advanced from the anus through the rectum and colon to the area of the cecum, ileocecal valve and appendiceal orifice. The cecum was deeply intubated. These structures were well-seen and photographed for the record. From the level of the cecum and ileocecal valve, the scope was slowly and cautiously withdrawn. The mucosal surfaces were carefully surveyed utilizing scope tip to flexion to facilitate fold flattening as needed. The scope was pulled down into the rectum where a thorough exam including retroflexion was performed.  Findings:   Prep excellent. 6 mm polyp removed from hepatic flexure with combination of cold snare and biopsy. 5 mm polyp removed from distal sigmoid colon with combination of cold snare and biopsy. Small diverticula at sigmoid colon. Rectal mucosa and anorectal junction.    Therapeutic/Diagnostic Maneuvers Performed:  See above  Complications:  None  EBL:  Minimal  Cecal Withdrawal Time:  16 minutes  Impression:  Examination performed to cecum. 6 mm polyp removed from hepatic flexure as above. 5 mm polyp removed from distal sigmoid colon as above. Otha of these polyps were submitted together.  Recommendations:  Standard instructions given. High fiber  diet. I will contact patient with biopsy results and further recommendations.  REHMAN,NAJEEB U  02/06/2015 10:58 AM  CC: Dr. Sherrie Mustache, MD & Dr. Rayne Du ref. provider found

## 2015-02-11 ENCOUNTER — Encounter (HOSPITAL_COMMUNITY): Payer: Self-pay | Admitting: Internal Medicine

## 2015-10-01 DIAGNOSIS — M810 Age-related osteoporosis without current pathological fracture: Secondary | ICD-10-CM | POA: Insufficient documentation

## 2015-12-31 NOTE — Congregational Nurse Program (Unsigned)
Congregational Nurse Program Note  Date of Encounter: 12/31/2015  Past Medical History: Past Medical History:  Diagnosis Date  . Cancer [facility]) 1996   left breast  . Essential hypertension, benign   . GERD (gastroesophageal reflux disease)     Encounter Details:  BP 150/70  Non-Smoker.  Takes BP meds as ordered. Under dental care for broken tooth.  Theron Arista, RN [phone]

## 2016-03-10 NOTE — Congregational Nurse Program (Signed)
Congregational Nurse Program Note  Date of Encounter: 03/10/2016  Past Medical History: Past Medical History:  Diagnosis Date  . Cancer [facility]) 1996   left breast  . Essential hypertension, benign   . GERD (gastroesophageal reflux disease)     Encounter Details:     CNP Questionnaire - 03/05/16 1050      Patient Demographics   Is this a new or existing patient? Existing   Patient is considered a/an Not Applicable   Race Caucasian/White     Patient Assistance   Location of Patient Assistance [place]   Patient's financial/insurance status Medicare   Uninsured Patient ([place]/Care Connects) No   Patient referred to apply for the following financial assistance Not Applicable   Food insecurities addressed Provided food supplies   Transportation assistance No   Assistance securing medications No   Educational health offerings Medications;Nutrition;Safety     Encounter Details   Primary purpose of visit Education/Health Concerns   Was an Emergency Department visit averted? Not Applicable   Does patient have a medical provider? Yes   Patient referred to Not Applicable   Was a mental health screening completed? (GAINS tool) No   Does patient have dental issues? No   Does patient have vision issues? Yes   Was a vision referral made? No   Does your patient have an abnormal blood pressure today? No   Since previous encounter, have you referred patient for abnormal blood pressure that resulted in a new diagnosis or medication change? No   Does your patient have an abnormal blood glucose today? No   Since previous encounter, have you referred patient for abnormal blood glucose that resulted in a new diagnosis or medication change? No   Was there a life-saving intervention made? No    03/05/16 -10:55 C/O (L) leg pain  Went to Dr.received Medication. Got a good report on every thing else. Didn't want Vital signs taken, just wanted the nurse to know she went to the doctor.   Marko Plume  [phone]

## 2016-05-25 ENCOUNTER — Ambulatory Visit (INDEPENDENT_AMBULATORY_CARE_PROVIDER_SITE_OTHER): Payer: Medicare Other

## 2016-05-25 ENCOUNTER — Ambulatory Visit (INDEPENDENT_AMBULATORY_CARE_PROVIDER_SITE_OTHER): Payer: Medicare Other | Admitting: Orthopaedic Surgery

## 2016-05-25 ENCOUNTER — Encounter: Payer: Self-pay | Admitting: Orthopaedic Surgery

## 2016-05-25 VITALS — BP 190/89 | HR 74 | Temp 97.7°F | Ht 65.0 in | Wt 221.0 lb

## 2016-05-25 DIAGNOSIS — G8929 Other chronic pain: Secondary | ICD-10-CM

## 2016-05-25 DIAGNOSIS — M25562 Pain in left knee: Secondary | ICD-10-CM | POA: Diagnosis not present

## 2016-05-25 NOTE — Progress Notes (Signed)
Subjective:    Patient ID: Anna Bradford, female    DOB: 1933/04/30, 83 y.o.   MRN: [id number]  HPI She has had increasing knee pain on the left over the last three months.  She has begun using a walker because the pain has been so bad.  She has swelling and popping and giving way of the knee.  She has tried BioFreeze, Advil, heat, rest, ice, rubs with only slight help.  She has no trauma,no redness.  She has taken prednisone for allergies recently and noted that the knee was improved while she was on the medicine.     Review of Systems  HENT: Negative for congestion.   Respiratory: Negative for cough and shortness of breath.   Cardiovascular: Negative for chest pain and leg swelling.  Endocrine: Positive for cold intolerance.  Musculoskeletal: Positive for arthralgias, back pain, gait problem and joint swelling.  Allergic/Immunologic: Positive for environmental allergies.   Past Medical History:  Diagnosis Date  . Arthritis   . Cancer ([place]) 1996   left breast  . Complication of anesthesia   . Essential hypertension, benign   . GERD (gastroesophageal reflux disease)     Past Surgical History:  Procedure Laterality Date  . Absess Off Intestine    . cataract surgery Left 08-10-2011  . cataract surgery Right 11-01-2011  . CHOLECYSTECTOMY     1972  . COLONOSCOPY N/A 02/06/2015   Procedure: COLONOSCOPY;  Surgeon: Rogene Houston, MD;  Location: [facility];  Service: Endoscopy;  Laterality: N/A;  1015  . GALLBLADDER SURGERY     Rupture Lower Stomach  . HERNIA REPAIR    . Hysterectomy ---- unknown     1980  . [place] Tumor    . Left Breast Masectomy     1986  . Right Ankle Surgery      Current Outpatient Prescriptions on File Prior to Visit  Medication Sig Dispense Refill  . Ascorbic Acid (VITAMIN C) 1000 MG tablet Take 1,000 mg by mouth daily. Reported on 01/23/2015    . aspirin 81 MG tablet Take 81 mg by mouth daily.    . Cholecalciferol (VITAMIN D-3) 1000 UNITS CAPS Take  2,000 Units by mouth daily.    Javier Docker Oil 300 MG CAPS Take 300 mg by mouth daily.    Marland Kitchen losartan-hydrochlorothiazide (HYZAAR) 100-25 MG per tablet Take 1 tablet by mouth daily.    . Multiple Minerals-Vitamins (CALCIUM-MAGNESIUM-ZINC-D3 PO) Take 1 tablet by mouth daily.    . Multiple Vitamin (MULTIVITAMIN) tablet Take 1 tablet by mouth daily.    . potassium gluconate 595 MG TABS Take 595 mg by mouth daily.    Marland Kitchen zinc gluconate 50 MG tablet Take 50 mg by mouth daily.    Marland Kitchen amLODipine (NORVASC) 5 MG tablet Take 5 mg by mouth.    . DiphenhydrAMINE HCl (BENADRYL ALLERGY PO) Take by mouth as needed.    Marland Kitchen ibuprofen (ADVIL,MOTRIN) 200 MG tablet Take 200 mg by mouth. One in am and one in pm     No current facility-administered medications on file prior to visit.     Social History   Social History  . Marital status: Widowed    Spouse name: N/A  . Number of children: 2  . Years of education: N/A   Occupational History  . Not on file.   Social History Main Topics  . Smoking status: Never Smoker  . Smokeless tobacco: Never Used  . Alcohol use No  . Drug  use: No  . Sexual activity: Not on file   Other Topics Concern  . Not on file   Social History Narrative  . No narrative on file    Family History  Problem Relation Age of Onset  . Cancer Mother   . Coronary artery disease Mother   . Heart disease Sister     BP (!) 190/89   Pulse 74   Temp 97.7 F (36.5 C)   Ht 5\' 5"  (1.651 m)   Wt 221 lb (100.2 kg)   BMI 36.78 kg/m      Objective:   Physical Exam  Constitutional: She is oriented to person, place, and time. She appears well-developed and well-nourished.  HENT:  Head: Normocephalic and atraumatic.  Eyes: Conjunctivae and EOM are normal. Pupils are equal, round, and reactive to light.  Neck: Normal range of motion. Neck supple.  Cardiovascular: Normal rate, regular rhythm and intact distal pulses.   Pulmonary/Chest: Effort normal.  Abdominal: Soft.  Musculoskeletal:  She exhibits tenderness (Left knee with crepitus, ROM 0 to 95 with pain, effusion present, limps to the left, uses walker, knee stable, medial joint line pain, some varicosities of the lower leg, NV intact.  Right knee ROM 0 to 110 with crepitus and effusion.).  Neurological: She is alert and oriented to person, place, and time. She displays normal reflexes. No cranial nerve deficit. She exhibits normal muscle tone. Coordination normal.  Skin: Skin is warm and dry.  Psychiatric: She has a normal mood and affect. Her behavior is normal. Judgment and thought content normal.  Vitals reviewed.         Assessment & Plan:   Encounter Diagnosis  Name Primary?  . Chronic pain of left knee Yes   PROCEDURE NOTE:  The patient requests injections of the left knee , verbal consent was obtained.  The left knee was prepped appropriately after time out was performed.   Sterile technique was observed and injection of 1 cc of Depo-Medrol 40 mg with several cc's of plain xylocaine. Anesthesia was provided by ethyl chloride and a 20-gauge needle was used to inject the knee area. The injection was tolerated well.  A band aid dressing was applied.  The patient was advised to apply ice later today and tomorrow to the injection sight as needed.  I have told her to take Aleve one bid pc.  Samples given.  Return in three weeks.  She may need MRI done.  Call if any problem.  Precautions discussed.  Electronically Signed Sanjuana Kava, MD 5/8/20189:31 AM

## 2016-05-26 ENCOUNTER — Ambulatory Visit: Payer: Medicare Other | Admitting: Orthopaedic Surgery

## 2016-05-28 ENCOUNTER — Telehealth: Payer: Self-pay | Admitting: Orthopaedic Surgery

## 2016-05-28 NOTE — Telephone Encounter (Signed)
This varies and is on an individual basis, patient is aware and states switching back to ibuprofen has seemed to help her

## 2016-05-28 NOTE — Telephone Encounter (Signed)
Patient called asking how long does it take for the shots that she got on Tuesday to take effect. She stated that she got  Cortizone and Prednisone shots.  Please advise

## 2016-06-16 ENCOUNTER — Ambulatory Visit: Payer: Medicare Other | Admitting: Orthopaedic Surgery

## 2016-07-07 ENCOUNTER — Ambulatory Visit (INDEPENDENT_AMBULATORY_CARE_PROVIDER_SITE_OTHER): Payer: Medicare Other | Admitting: Orthopaedic Surgery

## 2016-07-07 ENCOUNTER — Ambulatory Visit: Payer: Medicare Other

## 2016-07-07 ENCOUNTER — Ambulatory Visit (INDEPENDENT_AMBULATORY_CARE_PROVIDER_SITE_OTHER): Payer: Medicare Other

## 2016-07-07 ENCOUNTER — Encounter: Payer: Self-pay | Admitting: Orthopaedic Surgery

## 2016-07-07 VITALS — BP 130/84 | HR 73 | Ht 65.0 in | Wt 220.0 lb

## 2016-07-07 DIAGNOSIS — M25512 Pain in left shoulder: Secondary | ICD-10-CM

## 2016-07-07 NOTE — Progress Notes (Signed)
Patient Anna Bradford, female DOB:02/18/1933, 83 y.o. YBO:[id number]  Chief Complaint  Patient presents with  . New Problem    Left shoulder pain    HPI  Anna Bradford is a 83 y.o. female who has developed painful left shoulder over the last three to four weeks.  She was seen at Dr. Murrell Redden office 06-25-16 for this.  She had injection of Toradol and given prednisone dose pack.  She is slightly better but still hurts.  She has also tried ice, heat and rubs with no help.  She has no numbness.  She has no trauma or redness.  She has pain with any motion over head and laying on it at night.  HPI  Body mass index is 36.61 kg/m.  ROS  Review of Systems  HENT: Negative for congestion.   Respiratory: Negative for cough and shortness of breath.   Cardiovascular: Negative for chest pain and leg swelling.  Endocrine: Positive for cold intolerance.  Musculoskeletal: Positive for arthralgias, back pain, gait problem and joint swelling.  Allergic/Immunologic: Positive for environmental allergies.    Past Medical History:  Diagnosis Date  . Arthritis   . Cancer ([place]) 1996   left breast  . Complication of anesthesia   . Essential hypertension, benign   . GERD (gastroesophageal reflux disease)     Past Surgical History:  Procedure Laterality Date  . Absess Off Intestine    . cataract surgery Left 08-10-2011  . cataract surgery Right 11-01-2011  . CHOLECYSTECTOMY     1972  . COLONOSCOPY N/A 02/06/2015   Procedure: COLONOSCOPY;  Surgeon: Rogene Houston, MD;  Location: [facility];  Service: Endoscopy;  Laterality: N/A;  1015  . GALLBLADDER SURGERY     Rupture Lower Stomach  . HERNIA REPAIR    . Hysterectomy ---- unknown     1980  . [place] Tumor    . Left Breast Masectomy     1986  . Right Ankle Surgery      Family History  Problem Relation Age of Onset  . Cancer Mother   . Coronary artery disease Mother   . Heart disease Sister     Social History Social History   Substance Use Topics  . Smoking status: Never Smoker  . Smokeless tobacco: Never Used  . Alcohol use No    Allergies  Allergen Reactions  . Macrolides And Ketolides     Bleeding thru bowels  . Codeine Nausea And Vomiting  . Sulfa Antibiotics Nausea And Vomiting    Current Outpatient Prescriptions  Medication Sig Dispense Refill  . amLODipine (NORVASC) 5 MG tablet Take 5 mg by mouth.    . Ascorbic Acid (VITAMIN C) 1000 MG tablet Take 1,000 mg by mouth daily. Reported on 01/23/2015    . aspirin 81 MG tablet Take 81 mg by mouth daily.    . Cholecalciferol (VITAMIN D-3) 1000 UNITS CAPS Take 2,000 Units by mouth daily.    . DiphenhydrAMINE HCl (BENADRYL ALLERGY PO) Take by mouth as needed.    Marland Kitchen ibuprofen (ADVIL,MOTRIN) 200 MG tablet Take 200 mg by mouth. One in am and one in pm    . Krill Oil 300 MG CAPS Take 300 mg by mouth daily.    Marland Kitchen losartan-hydrochlorothiazide (HYZAAR) 100-25 MG per tablet Take 1 tablet by mouth daily.    . Multiple Minerals-Vitamins (CALCIUM-MAGNESIUM-ZINC-D3 PO) Take 1 tablet by mouth daily.    . Multiple Vitamin (MULTIVITAMIN) tablet Take 1 tablet by mouth daily.    Marland Kitchen  potassium gluconate 595 MG TABS Take 595 mg by mouth daily.    . traMADol (ULTRAM) 50 MG tablet Take by mouth every 6 (six) hours as needed.    . zinc gluconate 50 MG tablet Take 50 mg by mouth daily.     No current facility-administered medications for this visit.      Physical Exam  Blood pressure 130/84, pulse 73, height 5\' 5"  (1.651 m), weight 220 lb (99.8 kg).  Constitutional: overall normal hygiene, normal nutrition, well developed, normal grooming, normal body habitus. Assistive device:none  Musculoskeletal: gait and station Limp none, muscle tone and strength are normal, no tremors or atrophy is present.  .  Neurological: coordination overall normal.  Deep tendon reflex/nerve stretch intact.  Sensation normal.  Cranial nerves II-XII intact.   Skin:   Normal overall no scars,  lesions, ulcers or rashes. No psoriasis.  Psychiatric: Alert and oriented x 3.  Recent memory intact, remote memory unclear.  Normal mood and affect. Well groomed.  Good eye contact.  Cardiovascular: overall no swelling, no varicosities, no edema bilaterally, normal temperatures of the legs and arms, no clubbing, cyanosis and good capillary refill.  Lymphatic: palpation is normal.  Examination of left Upper Extremity is done.  Inspection:   Overall:  Elbow non-tender without crepitus or defects, forearm non-tender without crepitus or defects, wrist non-tender without crepitus or defects, hand non-tender.    Shoulder: with glenohumeral joint tenderness, without effusion.   Upper arm: without swelling and tenderness   Range of motion:   Overall:  Full range of motion of the elbow, full range of motion of wrist and full range of motion in fingers.   Shoulder:  left  120 degrees forward flexion; 90 degrees abduction; 30 degrees internal rotation, 30 degrees external rotation, 15 degrees extension, 40 degrees adduction.   Stability:   Overall:  Shoulder, elbow and wrist stable   Strength and Tone:   Overall full shoulder muscles strength, full upper arm strength and normal upper arm bulk and tone.   The patient has been educated about the nature of the problem(s) and counseled on treatment options.  The patient appeared to understand what I have discussed and is in agreement with it.  Encounter Diagnosis  Name Primary?  . Pain in joint of left shoulder Yes   X-rays were done of the left shoulder, reported separately.  PROCEDURE NOTE:  The patient request injection, verbal consent was obtained.  The left shoulder was prepped appropriately after time out was performed.   Sterile technique was observed and injection of 1 cc of Depo-Medrol 40 mg with several cc's of plain xylocaine. Anesthesia was provided by ethyl chloride and a 20-gauge needle was used to inject the shoulder area. A  posterior approach was used.  The injection was tolerated well.  A band aid dressing was applied.  The patient was advised to apply ice later today and tomorrow to the injection sight as needed.    PLAN Call if any problems.  Precautions discussed.  Continue current medications.   Return to clinic 2 weeks   Shoulder exercises given for her to do at home.  She declined formal PT.  Electronically Signed Sanjuana Kava, MD 6/20/20189:44 AM

## 2016-07-07 NOTE — Patient Instructions (Signed)

## 2016-07-27 ENCOUNTER — Ambulatory Visit (INDEPENDENT_AMBULATORY_CARE_PROVIDER_SITE_OTHER): Payer: Medicare Other | Admitting: Orthopaedic Surgery

## 2016-07-27 ENCOUNTER — Encounter: Payer: Self-pay | Admitting: Orthopaedic Surgery

## 2016-07-27 ENCOUNTER — Ambulatory Visit (INDEPENDENT_AMBULATORY_CARE_PROVIDER_SITE_OTHER): Payer: Medicare Other

## 2016-07-27 VITALS — BP 180/77 | HR 77 | Temp 96.9°F | Ht 65.0 in | Wt 221.0 lb

## 2016-07-27 DIAGNOSIS — M25551 Pain in right hip: Secondary | ICD-10-CM

## 2016-07-27 DIAGNOSIS — M7061 Trochanteric bursitis, right hip: Secondary | ICD-10-CM | POA: Diagnosis not present

## 2016-07-27 DIAGNOSIS — M25562 Pain in left knee: Secondary | ICD-10-CM | POA: Diagnosis not present

## 2016-07-27 DIAGNOSIS — G8929 Other chronic pain: Secondary | ICD-10-CM | POA: Diagnosis not present

## 2016-07-27 NOTE — Progress Notes (Signed)
Patient Anna Bradford, female DOB:11/17/1933, 83 y.o. TKW:[id number]  Chief Complaint  Patient presents with  . Follow-up    Left shoulder   . New Problem    Right Hip    HPI  Anna Bradford is a 83 y.o. female who has left shoulder pain. She is much improved after the injection to the left shoulder last time.  She has no paresthesias and better motion.  Her right hip is hurting more laterally.  She has no trauma.  She has pain with no paresthesias.  She has no redness. HPI  Body mass index is 36.78 kg/m.  ROS  Review of Systems  HENT: Negative for congestion.   Respiratory: Negative for cough and shortness of breath.   Cardiovascular: Negative for chest pain and leg swelling.  Endocrine: Positive for cold intolerance.  Musculoskeletal: Positive for arthralgias, back pain, gait problem and joint swelling.  Allergic/Immunologic: Positive for environmental allergies.    Past Medical History:  Diagnosis Date  . Arthritis   . Cancer ([place]) 1996   left breast  . Complication of anesthesia   . Essential hypertension, benign   . GERD (gastroesophageal reflux disease)     Past Surgical History:  Procedure Laterality Date  . Absess Off Intestine    . cataract surgery Left 08-10-2011  . cataract surgery Right 11-01-2011  . CHOLECYSTECTOMY     1972  . COLONOSCOPY N/A 02/06/2015   Procedure: COLONOSCOPY;  Surgeon: Rogene Houston, MD;  [facility];  Service: Endoscopy;  Laterality: N/A;  1015  . GALLBLADDER SURGERY     Rupture Lower Stomach  . HERNIA REPAIR    . Hysterectomy ---- unknown     1980  . [place] Tumor    . Left Breast Masectomy     1986  . Right Ankle Surgery      Family History  Problem Relation Age of Onset  . Cancer Mother   . Coronary artery disease Mother   . Heart disease Sister     Social History Social History  Substance Use Topics  . Smoking status: Never Smoker  . Smokeless tobacco: Never Used  . Alcohol use No    Allergies   Allergen Reactions  . Macrolides And Ketolides     Bleeding thru bowels  . Codeine Nausea And Vomiting  . Sulfa Antibiotics Nausea And Vomiting    Current Outpatient Prescriptions  Medication Sig Dispense Refill  . amLODipine (NORVASC) 5 MG tablet Take 5 mg by mouth.    . Ascorbic Acid (VITAMIN C) 1000 MG tablet Take 1,000 mg by mouth daily. Reported on 01/23/2015    . aspirin 81 MG tablet Take 81 mg by mouth daily.    . Cholecalciferol (VITAMIN D-3) 1000 UNITS CAPS Take 2,000 Units by mouth daily.    . DiphenhydrAMINE HCl (BENADRYL ALLERGY PO) Take by mouth as needed.    Marland Kitchen ibuprofen (ADVIL,MOTRIN) 200 MG tablet Take 200 mg by mouth. One in am and one in pm    . Krill Oil 300 MG CAPS Take 300 mg by mouth daily.    Marland Kitchen losartan-hydrochlorothiazide (HYZAAR) 100-25 MG per tablet Take 1 tablet by mouth daily.    . Multiple Minerals-Vitamins (CALCIUM-MAGNESIUM-ZINC-D3 PO) Take 1 tablet by mouth daily.    . Multiple Vitamin (MULTIVITAMIN) tablet Take 1 tablet by mouth daily.    . potassium gluconate 595 MG TABS Take 595 mg by mouth daily.    . traMADol (ULTRAM) 50 MG tablet Take by  mouth every 6 (six) hours as needed.    . zinc gluconate 50 MG tablet Take 50 mg by mouth daily.     No current facility-administered medications for this visit.      Physical Exam  Blood pressure (!) 180/77, pulse 77, temperature (!) 96.9 F (36.1 C), height 5\' 5"  (1.651 m), weight 221 lb (100.2 kg).  Constitutional: overall normal hygiene, normal nutrition, well developed, normal grooming, normal body habitus. Assistive device:none  Musculoskeletal: gait and station Limp right, muscle tone and strength are normal, no tremors or atrophy is present.  .  Neurological: coordination overall normal.  Deep tendon reflex/nerve stretch intact.  Sensation normal.  Cranial nerves II-XII intact.   Skin:   Normal overall no scars, lesions, ulcers or rashes. No psoriasis.  Psychiatric: Alert and oriented x 3.  Recent  memory intact, remote memory unclear.  Normal mood and affect. Well groomed.  Good eye contact.  Cardiovascular: overall no swelling, no varicosities, no edema bilaterally, normal temperatures of the legs and arms, no clubbing, cyanosis and good capillary refill.  Lymphatic: palpation is normal.  The left shoulder has near full ROM and little pain.  NV intact.  Muscle tone and strength normal.  Grips normal.  The right hip has some slight decreased motion.  Limp right.  She has pain over the trochanteric area of the hip.  NV intact.  X-rays were done of the right hip, reported separately.  The patient has been educated about the nature of the problem(s) and counseled on treatment options.  The patient appeared to understand what I have discussed and is in agreement with it.  Encounter Diagnoses  Name Primary?  . Right hip pain Yes  . Chronic pain of left knee   . Greater trochanteric bursitis of right hip    PROCEDURE NOTE:  The patient request injection, verbal consent was obtained.  The right trochanteric area of the hip was prepped appropriately after time out was performed.   Sterile technique was observed and injection of 1 cc of Depo-Medrol 40 mg with several cc's of plain xylocaine. Anesthesia was provided by ethyl chloride and a 20-gauge needle was used to inject the hip area. The injection was tolerated well.  A band aid dressing was applied.  The patient was advised to apply ice later today and tomorrow to the injection sight as needed.  PLAN Call if any problems.  Precautions discussed.  Continue current medications.   Return to clinic 1 month   Electronically Signed Sanjuana Kava, MD 7/10/201810:00 AM

## 2016-08-24 ENCOUNTER — Ambulatory Visit: Payer: Medicare Other | Admitting: Orthopaedic Surgery

## 2016-10-22 LAB — BASIC METABOLIC PANEL: GLUCOSE: 119

## 2017-03-17 ENCOUNTER — Ambulatory Visit: Payer: Medicare Other | Admitting: Orthopaedic Surgery

## 2017-03-17 ENCOUNTER — Encounter: Payer: Self-pay | Admitting: Orthopaedic Surgery

## 2017-03-17 VITALS — BP 163/87 | HR 76 | Temp 97.5°F | Ht 65.0 in | Wt 221.0 lb

## 2017-03-17 DIAGNOSIS — M7061 Trochanteric bursitis, right hip: Secondary | ICD-10-CM

## 2017-03-17 NOTE — Progress Notes (Signed)
PROCEDURE NOTE:  The patient request injection, verbal consent was obtained.  The right trochanteric area of the hip was prepped appropriately after time out was performed.   Sterile technique was observed and injection of 1 cc of Depo-Medrol 40 mg with several cc's of plain xylocaine. Anesthesia was provided by ethyl chloride and a 20-gauge needle was used to inject the hip area. The injection was tolerated well.  A band aid dressing was applied.  The patient was advised to apply ice later today and tomorrow to the injection sight as needed.  She has had bursitis of the right hip last July and did very well from the injection.  Return in three weeks.  If better, call and cancel.  Call if any problem.  Precautions discussed.   Electronically Signed Sanjuana Kava, MD 2/28/20199:06 AM

## 2017-04-06 ENCOUNTER — Ambulatory Visit: Payer: Medicare Other | Admitting: Orthopedic Surgery

## 2017-05-02 ENCOUNTER — Other Ambulatory Visit (HOSPITAL_COMMUNITY): Payer: Self-pay | Admitting: Adult Health Nurse Practitioner

## 2017-05-02 DIAGNOSIS — R1011 Right upper quadrant pain: Secondary | ICD-10-CM

## 2017-05-06 ENCOUNTER — Ambulatory Visit (HOSPITAL_COMMUNITY)
Admission: RE | Admit: 2017-05-06 | Discharge: 2017-05-06 | Disposition: A | Discharge status: Home / Self Care | Payer: Medicare Other | Source: Ambulatory Visit | Attending: Adult Health Nurse Practitioner | Admitting: Adult Health Nurse Practitioner

## 2017-05-06 DIAGNOSIS — I77811 Abdominal aortic ectasia: Secondary | ICD-10-CM | POA: Diagnosis not present

## 2017-05-06 DIAGNOSIS — N132 Hydronephrosis with renal and ureteral calculous obstruction: Secondary | ICD-10-CM | POA: Diagnosis not present

## 2017-05-06 DIAGNOSIS — K869 Disease of pancreas, unspecified: Secondary | ICD-10-CM | POA: Insufficient documentation

## 2017-05-06 DIAGNOSIS — R1011 Right upper quadrant pain: Secondary | ICD-10-CM

## 2017-05-06 DIAGNOSIS — Z9049 Acquired absence of other specified parts of digestive tract: Secondary | ICD-10-CM | POA: Diagnosis not present

## 2017-05-09 ENCOUNTER — Other Ambulatory Visit (HOSPITAL_COMMUNITY): Payer: Self-pay | Admitting: Adult Health Nurse Practitioner

## 2017-05-09 DIAGNOSIS — N1330 Unspecified hydronephrosis: Secondary | ICD-10-CM

## 2017-05-13 ENCOUNTER — Ambulatory Visit (HOSPITAL_COMMUNITY)
Admission: RE | Admit: 2017-05-13 | Discharge: 2017-05-13 | Disposition: A | Discharge status: Home / Self Care | Payer: Medicare Other | Source: Ambulatory Visit | Attending: Adult Health Nurse Practitioner | Admitting: Adult Health Nurse Practitioner

## 2017-05-13 DIAGNOSIS — Z87442 Personal history of urinary calculi: Secondary | ICD-10-CM | POA: Diagnosis not present

## 2017-05-13 DIAGNOSIS — N1330 Unspecified hydronephrosis: Secondary | ICD-10-CM | POA: Diagnosis present

## 2017-05-13 DIAGNOSIS — Q6102 Congenital multiple renal cysts: Secondary | ICD-10-CM | POA: Insufficient documentation

## 2017-05-31 ENCOUNTER — Encounter: Payer: Self-pay | Admitting: General Surgery

## 2017-05-31 ENCOUNTER — Ambulatory Visit: Payer: Medicare Other | Admitting: General Surgery

## 2017-05-31 VITALS — BP 186/89 | HR 71 | Temp 97.5°F | Resp 22 | Wt 228.0 lb

## 2017-05-31 DIAGNOSIS — R109 Unspecified abdominal pain: Secondary | ICD-10-CM | POA: Diagnosis not present

## 2017-05-31 NOTE — Patient Instructions (Signed)
Abdominal Pain, Adult Many things can cause belly (abdominal) pain. Most times, belly pain is not dangerous. Many cases of belly pain can be watched and treated at home. Sometimes belly pain is serious, though. Your doctor will try to find the cause of your belly pain. Follow these instructions at home:  Take over-the-counter and prescription medicines only as told by your doctor. Do not take medicines that help you poop (laxatives) unless told to by your doctor.  Drink enough fluid to keep your pee (urine) clear or pale yellow.  Watch your belly pain for any changes.  Keep all follow-up visits as told by your doctor. This is important. Contact a doctor if:  Your belly pain changes or gets worse.  You are not hungry, or you lose weight without trying.  You are having trouble pooping (constipated) or have watery poop (diarrhea) for more than 2-3 days.  You have pain when you pee or poop.  Your belly pain wakes you up at night.  Your pain gets worse with meals, after eating, or with certain foods.  You are throwing up and cannot keep anything down.  You have a fever. Get help right away if:  Your pain does not go away as soon as your doctor says it should.  You cannot stop throwing up.  Your pain is only in areas of your belly, such as the right side or the left lower part of the belly.  You have bloody or black poop, or poop that looks like tar.  You have very bad pain, cramping, or bloating in your belly.  You have signs of not having enough fluid or water in your body (dehydration), such as: ? Dark pee, very little pee, or no pee. ? Cracked lips. ? Dry mouth. ? Sunken eyes. ? Sleepiness. ? Weakness. This information is not intended to replace advice given to you by your health care provider. Make sure you discuss any questions you have with your health care provider. Document Released: 06/23/2007 Document Revised: 07/25/2015 Document Reviewed: 06/18/2015 Elsevier  Interactive Patient Education  2018 [place]. Muscle Strain A muscle strain (pulled muscle) happens when a muscle is stretched beyond normal length. It happens when a sudden, violent force stretches your muscle too far. Usually, a few of the fibers in your muscle are torn. Muscle strain is common in athletes. Recovery usually takes 1-2 weeks. Complete healing takes 5-6 weeks. Follow these instructions at home:  Follow the PRICE method of treatment to help your injury get better. Do this the first 2-3 days after the injury: ? Protect. Protect the muscle to keep it from getting injured again. ? Rest. Limit your activity and rest the injured body part. ? Ice. Put ice in a plastic bag. Place a towel between your skin and the bag. Then, apply the ice and leave it on from 15-20 minutes each hour. After the third day, switch to moist heat packs. ? Compression. Use a splint or elastic bandage on the injured area for comfort. Do not put it on too tightly. ? Elevate. Keep the injured body part above the level of your heart.  Only take medicine as told by your doctor.  Warm up before doing exercise to prevent future muscle strains. Contact a doctor if:  You have more pain or puffiness (swelling) in the injured area.  You feel numbness, tingling, or notice a loss of strength in the injured area. This information is not intended to replace advice given to you by  your health care provider. Make sure you discuss any questions you have with your health care provider. Document Released: 10/14/2007 Document Revised: 06/12/2015 Document Reviewed: 08/03/2012 Elsevier Interactive Patient Education  2017 [employer].

## 2017-05-31 NOTE — Progress Notes (Signed)
[facility] History and Physical  Reason for Referral: Hernia?  Referring Physician:  Dr. Edrick Oh   Chief Complaint    Hernia      Anna Bradford is a 84 y.o. female.  HPI: Anna Bradford is an 84 yo who reports having chronic right sided abdominal pain, flank pain and right back pain. She has had prior surgeries including an open cholecystectomy and hernia repair in the past and notes hard areas around these incision sites. She also has had a lipoma removed from her back per her report, and has pain in an area overlying the rib in the right posterior back.    She saw Dr. Edrick Oh 03/2017 and felt that she might have a hernia based on her exam, and was sent to me.  In the mean time, the patient has had an [place] of her abdomen that demonstrated some concern for hydronephrosis, and then ultimately a CT of the a/p that demonstrated renal sinus cyst but no hydronephrosis, no signs of any herniation or defect of the abdominal wall or other abdominal pathology.    She continues to have this pain.  She denies any nausea/vomiting, and has regular BMs.   Past Medical History:  Diagnosis Date  . Arthritis   . Cancer ([place]) 1996   left breast  . Complication of anesthesia   . Essential hypertension, benign   . GERD (gastroesophageal reflux disease)     Past Surgical History:  Procedure Laterality Date  . Absess Off Intestine    . cataract surgery Left 08-10-2011  . cataract surgery Right 11-01-2011  . CHOLECYSTECTOMY     1972  . COLONOSCOPY N/A 02/06/2015   Procedure: COLONOSCOPY;  Surgeon: Rogene Houston, MD;  [facility];  Service: Endoscopy;  Laterality: N/A;  1015  . GALLBLADDER SURGERY     Rupture Lower Stomach  . HERNIA REPAIR    . Hysterectomy ---- unknown     1980  . [place] Tumor    . Left Breast Masectomy     1986  . Right Ankle Surgery      Family History  Problem Relation Age of Onset  . Cancer Mother   . Coronary artery disease Mother   . Heart  disease Sister     Social History   Tobacco Use  . Smoking status: Never Smoker  . Smokeless tobacco: Never Used  Substance Use Topics  . Alcohol use: No  . Drug use: No    Medications: I have reviewed the patient's current medications. Allergies as of 05/31/2017      Reactions   Macrolides And Ketolides    Bleeding thru bowels   Codeine Nausea And Vomiting   Sulfa Antibiotics Nausea And Vomiting      Medication List        Accurate as of 05/31/17 10:52 AM. Always use your most recent med list.          amLODipine 5 MG tablet Commonly known as:  NORVASC Take 5 mg by mouth.   aspirin 81 MG tablet Take 81 mg by mouth daily.   BENADRYL ALLERGY PO Take by mouth as needed.   CALCIUM-MAGNESIUM-ZINC-D3 PO Take 1 tablet by mouth daily.   ibuprofen 200 MG tablet Commonly known as:  ADVIL,MOTRIN Take 200 mg by mouth. One in am and one in pm   Krill Oil 300 MG Caps Take 300 mg by mouth daily.   losartan-hydrochlorothiazide 100-25 MG tablet Commonly known as:  HYZAAR Take 1  tablet by mouth daily.   multivitamin tablet Take 1 tablet by mouth daily.   potassium gluconate 595 (99 K) MG Tabs tablet Take 595 mg by mouth daily.   traMADol 50 MG tablet Commonly known as:  ULTRAM Take by mouth every 6 (six) hours as needed.   vitamin C 1000 MG tablet Take 1,000 mg by mouth daily. Reported on 01/23/2015   Vitamin D-3 1000 units Caps Take 2,000 Units by mouth daily.   zinc gluconate 50 MG tablet Take 50 mg by mouth daily.        ROS:  A comprehensive review of systems was negative except for: Respiratory: positive for wheezing Cardiovascular: positive for varicose veins Gastrointestinal: positive for abdominal pain Musculoskeletal: positive for back pain  Blood pressure (!) 186/89, pulse 71, temperature (!) 97.5 F (36.4 C), temperature source Temporal, resp. rate (!) 22, weight 228 lb (103.4 kg). Physical Exam  Constitutional: She is oriented to person,  place, and time. She appears well-developed and well-nourished.  HENT:  Head: Normocephalic.  Eyes: Pupils are equal, round, and reactive to light.  Neck: Normal range of motion.  Cardiovascular: Normal rate.  Pulmonary/Chest: Effort normal.  Right posterior back at bra line over rib, pain with palpation, no mass appreciated   Abdominal: Soft. She exhibits no distension and no mass. There is tenderness. No hernia.  RUQ incision and incision lower; induration and scarring, no hernia appreciated, no umbilical hernia appreciated  Musculoskeletal: Normal range of motion.  Neurological: She is alert and oriented to person, place, and time.  Skin: Skin is warm and dry.  Psychiatric: She has a normal mood and affect. Her behavior is normal. Judgment and thought content normal.  Vitals reviewed.   Results: personally reviewed imaging- no hernia or defect of abdominal wall noted, no lipoma seen in the subcutaneous tissue anterior or posteriorly overlying rib/ pain point in question   [place] 04/2017 IMPRESSION: 1. Mild to moderate bilateral hydronephrosis. 2. Nonobstructing LEFT UPPER pole calculus measuring approximately 9 millimeters. 3. Consider further evaluation with CT of the abdomen and pelvis without contrast to evaluate cause of hydronephrosis. 4. Mildly ectatic pancreatic duct which can be seen in the setting of chronic pancreatitis. 5. Cholecystectomy. 6. Mildly echogenic liver without other features of hepatic steatosis. 7. 2.8 centimeter abdominal aorta. Ectatic abdominal aorta at risk for aneurysm development. Recommend followup by ultrasound in 5 years. This recommendation follows ACR consensus guidelines: White Paper of the ACR Incidental Findings Committee II on Vascular Findings. J Am Coll Radiol 2013; 10:789-794.  CT a/p 04/2017 IMPRESSION: 1. No acute findings within the abdomen or pelvis. 2. Bilateral hydronephrosis was reported on the prior ultrasound. On CT, there are  bilateral renal sinus cysts, but no convincing hydronephrosis. No ureteral dilation or stones.  Assessment & Plan:  Anna Bradford is a 84 y.o. female with chronic right upper quadrant/ flank/ back pain.  She has prior scars with some induration but no clinical or imaging signs of hernia.  She was upset by this but understood that there was scar and no hernia.  I reassured her.  She does not need any surgical intervention for this pain and likely this is from activity and age.  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    -Information about muscle strain / abdominal pain given to the patient, recommended possible some  ice/ heat to the area versus rubs, she is already taking some ibuprofen   -No surgical intervention indicated   All questions were answered to the satisfaction of the patient.    Virl Cagey 05/31/2017, 10:52 AM

## 2017-08-03 ENCOUNTER — Ambulatory Visit (HOSPITAL_COMMUNITY)
Admission: RE | Admit: 2017-08-03 | Discharge: 2017-08-03 | Disposition: A | Discharge status: Home / Self Care | Payer: Medicare Other | Source: Ambulatory Visit | Attending: Family Medicine | Admitting: Family Medicine

## 2017-08-03 ENCOUNTER — Other Ambulatory Visit (HOSPITAL_COMMUNITY): Payer: Self-pay | Admitting: Family Medicine

## 2017-08-03 DIAGNOSIS — M79604 Pain in right leg: Secondary | ICD-10-CM | POA: Insufficient documentation

## 2017-08-24 ENCOUNTER — Encounter: Payer: Self-pay | Admitting: Orthopaedic Surgery

## 2017-08-24 ENCOUNTER — Ambulatory Visit: Payer: Medicare Other | Admitting: Orthopaedic Surgery

## 2017-08-24 DIAGNOSIS — M7061 Trochanteric bursitis, right hip: Secondary | ICD-10-CM

## 2017-08-24 DIAGNOSIS — M25551 Pain in right hip: Secondary | ICD-10-CM

## 2017-08-24 NOTE — Progress Notes (Signed)
PROCEDURE NOTE:  The patient request injection, verbal consent was obtained.  The right trochanteric area of the hip was prepped appropriately after time out was performed.   Sterile technique was observed and injection of 1 cc of Depo-Medrol 40 mg with several cc's of plain xylocaine. Anesthesia was provided by ethyl chloride and a 20-gauge needle was used to inject the hip area. The injection was tolerated well.  A band aid dressing was applied.  The patient was advised to apply ice later today and tomorrow to the injection sight as needed.  Call if any problem.  Precautions discussed.   Electronically Signed Sanjuana Kava, MD 8/7/20199:37 AM

## 2017-12-02 LAB — GLUCOSE, POCT (MANUAL RESULT ENTRY): POC GLUCOSE: 114 mg/dL — AB (ref 70–99)

## 2018-01-19 ENCOUNTER — Encounter: Payer: Self-pay | Admitting: General Surgery

## 2018-01-19 ENCOUNTER — Ambulatory Visit: Payer: Medicare Other | Admitting: General Surgery

## 2018-01-19 VITALS — BP 194/93 | HR 87 | Temp 97.7°F | Resp 22 | Wt 228.6 lb

## 2018-01-19 DIAGNOSIS — R222 Localized swelling, mass and lump, trunk: Secondary | ICD-10-CM

## 2018-01-19 DIAGNOSIS — R1900 Intra-abdominal and pelvic swelling, mass and lump, unspecified site: Secondary | ICD-10-CM | POA: Diagnosis not present

## 2018-01-19 NOTE — Patient Instructions (Signed)
Lipoma Removal Lipoma removal is a surgical procedure to remove a noncancerous (benign) tumor that is made up of fat cells (lipoma). Most lipomas are small and painless and do not require treatment. They can form in many areas of the body but are most common under the skin of the back, shoulders, arms, and thighs. You may need lipoma removal if you have a lipoma that is large, growing, or causing discomfort. Lipoma removal may also be done for cosmetic reasons. Tell a health care provider about:  Any allergies you have.  All medicines you are taking, including vitamins, herbs, eye drops, creams, and over-the-counter medicines.  Any problems you or family members have had with anesthetic medicines.  Any blood disorders you have.  Any surgeries you have had.  Any medical conditions you have.  Whether you are pregnant or may be pregnant. What are the risks? Generally, this is a safe procedure. However, problems may occur, including:  Infection.  Bleeding.  Allergic reactions to medicines.  Damage to nerves or blood vessels near the lipoma.  Scarring. Medicines  Ask your health care provider about: ? Changing or stopping your regular medicines. This is especially important if you are taking diabetes medicines or blood thinners. ? Taking medicines such as aspirin and ibuprofen. These medicines can thin your blood. Do not take these medicines before your procedure if your health care provider instructs you not to.  You may be given antibiotic medicine to help prevent infection. General instructions  Ask your health care provider how your surgical site will be marked or identified.  You will have a physical exam. Your health care provider will check the size of the lipoma and whether it can be moved easily.  You may have imaging tests, such as: ? X-rays. ? CT scan. ? MRI.  Plan to have someone take you home from the hospital or clinic. What happens during the  procedure?  To reduce your risk of infection: ? Your health care team will wash or sanitize their hands. ? Your skin will be washed with soap.  You will be given one or more of the following: ? A medicine to help you relax (sedative). ? A medicine to numb the area (local anesthetic). ? A medicine to make you fall asleep (general anesthetic). ? A medicine that is injected into an area of your body to numb everything below the injection site (regional anesthetic).  An incision will be made over the lipoma or very near the lipoma. The incision may be made in a natural skin line or crease.  Tissues, nerves, and blood vessels near the lipoma will be moved out of the way.  The lipoma and the capsule that surrounds it will be separated from the surrounding tissues.  The lipoma will be removed.  The incision may be closed with stitches (sutures).  A bandage (dressing) will be placed over the incision. What happens after the procedure?  Do not drive for 24 hours if you received a sedative.  Your blood pressure, heart rate, breathing rate, and blood oxygen level will be monitored until the medicines you were given have worn off. This information is not intended to replace advice given to you by your health care provider. Make sure you discuss any questions you have with your health care provider. Document Released: 03/20/2015 Document Revised: 06/12/2015 Document Reviewed: 03/20/2015 Elsevier Interactive Patient Education  2019 [employer].

## 2018-01-19 NOTE — Progress Notes (Signed)
[facility] History and Physical  Reason for Referral: ? Lipoma   CIELA MAHAJAN is a 85 y.o. female.  HPI: Ms. Frayne is an 85 yo who has a history of HTN, GERD, history of an open cholecystectomy, open appendectomy, and hernia repair and chronic pain on there right side who initially was sent to me due to concern for possible hernia versus lipoma on her abdomen. She had a CT scan prior to seeing me and no hernia was noted and there was no obvious lipoma on the CT scan. She comes back in due to continued point tenderness in these specific areas where she "feels knots." She says she is constantly in pain at these areas and says that it is constant and sharp in nature. It limits her mobility and her self care per her report.   She otherwise is well and has no other issues. She still cooks for the homeless. She says that she is to her breaking point and cannot live with this pain any more.    Past Medical History:  Diagnosis Date  . Arthritis   . Cancer ([place]) 1996   left breast  . Complication of anesthesia   . Essential hypertension, benign   . GERD (gastroesophageal reflux disease)     Past Surgical History:  Procedure Laterality Date  . Absess Off Intestine    . cataract surgery Left 08-10-2011  . cataract surgery Right 11-01-2011  . CHOLECYSTECTOMY     1972  . COLONOSCOPY N/A 02/06/2015   Procedure: COLONOSCOPY;  Surgeon: Rogene Houston, MD;  Location: [facility];  Service: Endoscopy;  Laterality: N/A;  1015  . GALLBLADDER SURGERY     Rupture Lower Stomach  . HERNIA REPAIR    . Hysterectomy ---- unknown     1980  . [place] Tumor    . Left Breast Masectomy     1986  . Right Ankle Surgery      Family History  Problem Relation Age of Onset  . Cancer Mother   . Coronary artery disease Mother   . Heart disease Sister     Social History   Tobacco Use  . Smoking status: Never Smoker  . Smokeless tobacco: Never Used  Substance Use Topics  . Alcohol use:  No  . Drug use: No    Medications: I have reviewed the patient's current medications. Allergies as of 01/19/2018      Reactions   Macrolides And Ketolides    Bleeding thru bowels   Codeine Nausea And Vomiting   Sulfa Antibiotics Nausea And Vomiting      Medication List       Accurate as of January 19, 2018  3:29 PM. Always use your most recent med list.        amLODipine 5 MG tablet Commonly known as:  NORVASC Take 5 mg by mouth.   aspirin 81 MG tablet Take 81 mg by mouth daily.   BENADRYL ALLERGY PO Take by mouth as needed.   CALCIUM-MAGNESIUM-ZINC-D3 PO Take 1 tablet by mouth daily.   ibuprofen 200 MG tablet Commonly known as:  ADVIL,MOTRIN Take 200 mg by mouth. One in am and one in pm   Krill Oil 300 MG Caps Take 300 mg by mouth daily.   losartan-hydrochlorothiazide 100-25 MG tablet Commonly known as:  HYZAAR Take 1 tablet by mouth daily.   multivitamin tablet Take 1 tablet by mouth daily.   potassium gluconate 595 (99 K) MG Tabs tablet Take 595  mg by mouth daily.   vitamin C 1000 MG tablet Take 1,000 mg by mouth daily. Reported on 01/23/2015   Vitamin D-3 25 MCG (1000 UT) Caps Take 2,000 Units by mouth daily.   zinc gluconate 50 MG tablet Take 50 mg by mouth daily.        ROS:  A comprehensive review of systems was negative except for: Musculoskeletal: positive for point pain at the knot on her right lower back, right back at the rib level and right abdomen at her prior cholecystectomy scar  Blood pressure (!) 194/93, pulse 87, temperature 97.7 F (36.5 C), resp. rate (!) 22, weight 228 lb 9.6 oz (103.7 kg). Physical Exam Vitals signs reviewed.  Constitutional:      Appearance: Normal appearance.  HENT:     Head: Normocephalic and atraumatic.     Nose: Nose normal.     Mouth/Throat:     Mouth: Mucous membranes are moist.  Eyes:     Extraocular Movements: Extraocular movements intact.     Pupils: Pupils are equal, round, and reactive to  light.  Neck:     Musculoskeletal: Normal range of motion.  Cardiovascular:     Rate and Rhythm: Normal rate and regular rhythm.  Pulmonary:     Effort: Pulmonary effort is normal.     Breath sounds: Normal breath sounds.  Abdominal:     General: There is no distension.     Palpations: Abdomen is soft. There is mass.     Tenderness: There is abdominal tenderness.     Hernia: No hernia is present.       Comments: Area of induration at her scar, no hernia   Musculoskeletal:       Arms:     Comments: Area at the right rib that is a mobile superficial mass about 2cm, area in the right lower abdomen, mobile mass about 2cm   Neurological:     Mental Status: She is alert.     Results: Personally reviewed CT again- 04/2017 The area at the right rib in correspondence with her complaints, there could be a small lipoma in that area.  I do not appreciate anything lower on the right back or the right abdomen that looks like a lipoma on the CT scan or a cyst   Assessment & Plan:  WINSLOW VERRILL is a 85 y.o. female with palpable mass area on her abdomen and back. She says these cause her pain. The one area at her right rib is possibly a lipoma after reviewing the CT for a second time. I still do not appreciate a lipoma on the abdomen, but this could be scar tissue causing the mass and pain from this scar.  We have discussed the option of repeating a CT scan with markers on the skin to look at this area better. We have also discussed the option of surgical exploration and possible removal of the lipoma/ scar if present. She wants to proceed with surgery. She understands that this may not find anything and that this may not help her pain. She also understands that this could cause her more pain from further scarring. At this time, she wants to try something to make her quality of life better.   -Excision of mass from abdomen and back (will mark areas the day of surgery and explore from there)    All  questions were answered to the satisfaction of the patient.  The risk and benefits of excision of the masses  were discussed including but not limited to bleeding, infection, recurrence, no change in pain/ symptoms or worsening pain.  After careful consideration, MARILLA BODDY has decided to proceed.    Virl Cagey 01/19/2018, 3:29 PM

## 2018-01-20 NOTE — H&P (Signed)
[facility] History and Physical  Reason for Referral: ? Lipoma  Anna Bradford is a 85 y.o. female.  HPI: Anna Bradford is an 85 yo who has a history of HTN, GERD, history of an open cholecystectomy, open appendectomy, and hernia repair and chronic pain on there right side who initially was sent to me due to concern for possible hernia versus lipoma on her abdomen. She had a CT scan prior to seeing me and no hernia was noted and there was no obvious lipoma on the CT scan. She comes back in due to continued point tenderness in these specific areas where she "feels knots." She says she is constantly in pain at these areas and says that it is constant and sharp in nature. It limits her mobility and her self care per her report.   She otherwise is well and has no other issues. She still cooks for the homeless. She says that she is to her breaking point and cannot live with this pain any more.        Past Medical History:  Diagnosis Date  . Arthritis   . Cancer ([place]) 1996   left breast  . Complication of anesthesia   . Essential hypertension, benign   . GERD (gastroesophageal reflux disease)          Past Surgical History:  Procedure Laterality Date  . Absess Off Intestine    . cataract surgery Left 08-10-2011  . cataract surgery Right 11-01-2011  . CHOLECYSTECTOMY     1972  . COLONOSCOPY N/A 02/06/2015   Procedure: COLONOSCOPY;  Surgeon: Rogene Houston, MD;  Location: [facility];  Service: Endoscopy;  Laterality: N/A;  1015  . GALLBLADDER SURGERY     Rupture Lower Stomach  . HERNIA REPAIR    . Hysterectomy ---- unknown     1980  . [place] Tumor    . Left Breast Masectomy     1986  . Right Ankle Surgery           Family History  Problem Relation Age of Onset  . Cancer Mother   . Coronary artery disease Mother   . Heart disease Sister     Social History       Tobacco Use  . Smoking status: Never Smoker  . Smokeless  tobacco: Never Used  Substance Use Topics  . Alcohol use: No  . Drug use: No    Medications: I have reviewed the patient's current medications.      Allergies as of 01/19/2018      Reactions   Macrolides And Ketolides    Bleeding thru bowels   Codeine Nausea And Vomiting   Sulfa Antibiotics Nausea And Vomiting         Medication List       Accurate as of January 19, 2018  3:29 PM. Always use your most recent med list.        amLODipine 5 MG tablet Commonly known as:  NORVASC Take 5 mg by mouth.   aspirin 81 MG tablet Take 81 mg by mouth daily.   BENADRYL ALLERGY PO Take by mouth as needed.   CALCIUM-MAGNESIUM-ZINC-D3 PO Take 1 tablet by mouth daily.   ibuprofen 200 MG tablet Commonly known as:  ADVIL,MOTRIN Take 200 mg by mouth. One in am and one in pm   Krill Oil 300 MG Caps Take 300 mg by mouth daily.   losartan-hydrochlorothiazide 100-25 MG tablet Commonly known as:  HYZAAR Take 1 tablet  by mouth daily.   multivitamin tablet Take 1 tablet by mouth daily.   potassium gluconate 595 (99 K) MG Tabs tablet Take 595 mg by mouth daily.   vitamin C 1000 MG tablet Take 1,000 mg by mouth daily. Reported on 01/23/2015   Vitamin D-3 25 MCG (1000 UT) Caps Take 2,000 Units by mouth daily.   zinc gluconate 50 MG tablet Take 50 mg by mouth daily.        ROS:  A comprehensive review of systems was negative except for: Musculoskeletal: positive for point pain at the knot on her right lower back, right back at the rib level and right abdomen at her prior cholecystectomy scar  Blood pressure (!) 194/93, pulse 87, temperature 97.7 F (36.5 C), resp. rate (!) 22, weight 228 lb 9.6 oz (103.7 kg). Physical Exam Vitals signs reviewed.  Constitutional:      Appearance: Normal appearance.  HENT:     Head: Normocephalic and atraumatic.     Nose: Nose normal.     Mouth/Throat:     Mouth: Mucous membranes are moist.  Eyes:      Extraocular Movements: Extraocular movements intact.     Pupils: Pupils are equal, round, and reactive to light.  Neck:     Musculoskeletal: Normal range of motion.  Cardiovascular:     Rate and Rhythm: Normal rate and regular rhythm.  Pulmonary:     Effort: Pulmonary effort is normal.     Breath sounds: Normal breath sounds.  Abdominal:     General: There is no distension.     Palpations: Abdomen is soft. There is mass.     Tenderness: There is abdominal tenderness.     Hernia: No hernia is present.       Comments: Area of induration at her scar, no hernia   Musculoskeletal:       Arms:     Comments: Area at the right rib that is a mobile superficial mass about 2cm, area in the right lower abdomen, mobile mass about 2cm   Neurological:     Mental Status: She is alert.     Results: Personally reviewed CT again- 04/2017 The area at the right rib in correspondence with her complaints, there could be a small lipoma in that area.  I do not appreciate anything lower on the right back or the right abdomen that looks like a lipoma on the CT scan or a cyst   Assessment & Plan:  Anna Bradford is a 85 y.o. female with palpable mass area on her abdomen and back. She says these cause her pain. The one area at her right rib is possibly a lipoma after reviewing the CT for a second time. I still do not appreciate a lipoma on the abdomen, but this could be scar tissue causing the mass and pain from this scar.  We have discussed the option of repeating a CT scan with markers on the skin to look at this area better. We have also discussed the option of surgical exploration and possible removal of the lipoma/ scar if present. She wants to proceed with surgery. She understands that this may not find anything and that this may not help her pain. She also understands that this could cause her more pain from further scarring. At this time, she wants to try something to make her quality of life better.    -Excision of mass from abdomen and back (will mark areas the day of surgery and explore  from there)    All questions were answered to the satisfaction of the patient.  The risk and benefits of excision of the masses were discussed including but not limited to bleeding, infection, recurrence, no change in pain/ symptoms or worsening pain.  After careful consideration, Anna Bradford has decided to proceed.    Virl Cagey 01/19/2018, 3:29 PM

## 2018-01-24 NOTE — Patient Instructions (Signed)
Anna Bradford  01/24/2018     @PREFPERIOPPHARMACY @   Your procedure is scheduled on  01/30/2018 .  Report to [facility] at  900   A.M.  Call this number if you have problems the morning of surgery:  [phone]   Remember:  Do not eat or drink after midnight.                      Take these medicines the morning of surgery with A SIP OF WATER  Amlodipine, robaxin.    Do not wear jewelry, make-up or nail polish.  Do not wear lotions, powders, or perfumes, or deodorant.  Do not shave 48 hours prior to surgery.  Men may shave face and neck.  Do not bring valuables to the hospital.  [facility] is not responsible for any belongings or valuables.  Contacts, dentures or bridgework may not be worn into surgery.  Leave your suitcase in the car.  After surgery it may be brought to your room.  For patients admitted to the hospital, discharge time will be determined by your treatment team.  Patients discharged the day of surgery will not be allowed to drive home.   Name and phone number of your driver:   family Special instructions:  None  Please read over the following fact sheets that you were given. Anesthesia Post-op Instructions and Care and Recovery After Surgery       Lipoma Removal Lipoma removal is a surgical procedure to remove a noncancerous (benign) tumor that is made up of fat cells (lipoma). Most lipomas are small and painless and do not require treatment. They can form in many areas of the body but are most common under the skin of the back, shoulders, arms, and thighs. You may need lipoma removal if you have a lipoma that is large, growing, or causing discomfort. Lipoma removal may also be done for cosmetic reasons. Tell a health care provider about:  Any allergies you have.  All medicines you are taking, including vitamins, herbs, eye drops, creams, and over-the-counter medicines.  Any problems you or family members have had with anesthetic  medicines.  Any blood disorders you have.  Any surgeries you have had.  Any medical conditions you have.  Whether you are pregnant or may be pregnant. What are the risks? Generally, this is a safe procedure. However, problems may occur, including:  Infection.  Bleeding.  Allergic reactions to medicines.  Damage to nerves or blood vessels near the lipoma.  Scarring. What happens before the procedure? Staying hydrated Follow instructions from your health care provider about hydration, which may include:  Up to 2 hours before the procedure - you may continue to drink clear liquids, such as water, clear fruit juice, black coffee, and plain tea. Eating and drinking restrictions Follow instructions from your health care provider about eating and drinking, which may include:  8 hours before the procedure - stop eating heavy meals or foods such as meat, fried foods, or fatty foods.  6 hours before the procedure - stop eating light meals or foods, such as toast or cereal.  6 hours before the procedure - stop drinking milk or drinks that contain milk.  2 hours before the procedure - stop drinking clear liquids. Medicines  Ask your health care provider about: ? Changing or stopping your regular medicines. This is especially important if you are taking diabetes medicines or blood thinners. ?  Taking medicines such as aspirin and ibuprofen. These medicines can thin your blood. Do not take these medicines before your procedure if your health care provider instructs you not to.  You may be given antibiotic medicine to help prevent infection. General instructions  Ask your health care provider how your surgical site will be marked or identified.  You will have a physical exam. Your health care provider will check the size of the lipoma and whether it can be moved easily.  You may have imaging tests, such as: ? X-rays. ? CT scan. ? MRI.  Plan to have someone take you home from the  hospital or clinic. What happens during the procedure?  To reduce your risk of infection: ? Your health care team will wash or sanitize their hands. ? Your skin will be washed with soap.  You will be given one or more of the following: ? A medicine to help you relax (sedative). ? A medicine to numb the area (local anesthetic). ? A medicine to make you fall asleep (general anesthetic). ? A medicine that is injected into an area of your body to numb everything below the injection site (regional anesthetic).  An incision will be made over the lipoma or very near the lipoma. The incision may be made in a natural skin line or crease.  Tissues, nerves, and blood vessels near the lipoma will be moved out of the way.  The lipoma and the capsule that surrounds it will be separated from the surrounding tissues.  The lipoma will be removed.  The incision may be closed with stitches (sutures).  A bandage (dressing) will be placed over the incision. What happens after the procedure?  Do not drive for 24 hours if you received a sedative.  Your blood pressure, heart rate, breathing rate, and blood oxygen level will be monitored until the medicines you were given have worn off. This information is not intended to replace advice given to you by your health care provider. Make sure you discuss any questions you have with your health care provider. Document Released: 03/20/2015 Document Revised: 06/12/2015 Document Reviewed: 03/20/2015 Elsevier Interactive Patient Education  2019 [employer]  Lipoma Removal, Care After Refer to this sheet in the next few weeks. These instructions provide you with information about caring for yourself after your procedure. Your health care provider may also give you more specific instructions. Your treatment has been planned according to current medical practices, but problems sometimes occur. Call your health care provider if you have any problems or questions  after your procedure. What can I expect after the procedure? After the procedure, it is common to have:  Mild pain.  Swelling.  Bruising. Follow these instructions at home:  Bathing  Do not take baths, swim, or use a hot tub until your health care provider approves. Ask your health care provider if you can take showers. You may only be allowed to take sponge baths for bathing.  Keep your bandage (dressing) dry until your health care provider says it can be removed. Incision care   Follow instructions from your health care provider about how to take care of your incision. Make sure you: ? Wash your hands with soap and water before you change your bandage (dressing). If soap and water are not available, use hand sanitizer. ? Change your dressing as told by your health care provider. ? Leave stitches (sutures), skin glue, or adhesive strips in place. These skin closures may need to stay in place  for 2 weeks or longer. If adhesive strip edges start to loosen and curl up, you may trim the loose edges. Do not remove adhesive strips completely unless your health care provider tells you to do that.  Check your incision area every day for signs of infection. Check for: ? More redness, swelling, or pain. ? Fluid or blood. ? Warmth. ? Pus or a bad smell. Driving  Do not drive or operate heavy machinery while taking prescription pain medicine.  Do not drive for 24 hours if you received a medicine to help you relax (sedative) during your procedure.  Ask your health care provider when it is safe for you to drive. General instructions  Take over-the-counter and prescription medicines only as told by your health care provider.  Do not use any tobacco products, such as cigarettes, chewing tobacco, and e-cigarettes. These can delay healing. If you need help quitting, ask your health care provider.  Return to your normal activities as told by your health care provider. Ask your health care  provider what activities are safe for you.  Keep all follow-up visits as told by your health care provider. This is important. Contact a health care provider if:  You have more redness, swelling, or pain around your incision.  You have fluid or blood coming from your incision.  Your incision feels warm to the touch.  You have pus or a bad smell coming from your incision.  You have pain that does not get better with medicine. Get help right away if:  You have chills or a fever.  You have severe pain. This information is not intended to replace advice given to you by your health care provider. Make sure you discuss any questions you have with your health care provider. Document Released: 03/20/2015 Document Revised: 06/17/2015 Document Reviewed: 03/20/2015 Elsevier Interactive Patient Education  2019 [place] Anesthesia, Adult General anesthesia is the use of medicines to make a person "go to sleep" (unconscious) for a medical procedure. General anesthesia must be used for certain procedures, and is often recommended for procedures that:  Last a long time.  Require you to be still or in an unusual position.  Are major and can cause blood loss. The medicines used for general anesthesia are called general anesthetics. As well as making you unconscious for a certain amount of time, these medicines:  Prevent pain.  Control your blood pressure.  Relax your muscles. Tell a health care provider about:  Any allergies you have.  All medicines you are taking, including vitamins, herbs, eye drops, creams, and over-the-counter medicines.  Any problems you or family members have had with anesthetic medicines.  Types of anesthetics you have had in the past.  Any blood disorders you have.  Any surgeries you have had.  Any medical conditions you have.  Any recent upper respiratory, chest, or ear infections.  Any history of: ? Heart or lung conditions, such as  heart failure, sleep apnea, asthma, or chronic obstructive pulmonary disease (COPD). ? Armed forces logistics/support/administrative officer. ? Depression or anxiety.  Any tobacco or drug use, including marijuana or alcohol use.  Whether you are pregnant or may be pregnant. What are the risks? Generally, this is a safe procedure. However, problems may occur, including:  Allergic reaction.  Lung and heart problems.  Inhaling food or liquid from the stomach into the lungs (aspiration).  Nerve injury.  Dental injury.  Air in the bloodstream, which can lead to stroke.  Extreme agitation or confusion (  delirium) when you wake up from the anesthetic.  Waking up during your procedure and being unable to move. This is rare. These problems are more likely to develop if you are having a major surgery or if you have an advanced or serious medical condition. You can prevent some of these complications by answering all of your health care provider's questions thoroughly and by following all instructions before your procedure. General anesthesia can cause side effects, including:  Nausea or vomiting.  A sore throat from the breathing tube.  Hoarseness.  Wheezing or coughing.  Shaking chills.  Tiredness.  Body aches.  Anxiety.  Sleepiness or drowsiness.  Confusion or agitation. What happens before the procedure? Staying hydrated Follow instructions from your health care provider about hydration, which may include:  Up to 2 hours before the procedure - you may continue to drink clear liquids, such as water, clear fruit juice, black coffee, and plain tea.  Eating and drinking restrictions Follow instructions from your health care provider about eating and drinking, which may include:  8 hours before the procedure - stop eating heavy meals or foods such as meat, fried foods, or fatty foods.  6 hours before the procedure - stop eating light meals or foods, such as toast or cereal.  6 hours before the procedure -  stop drinking milk or drinks that contain milk.  2 hours before the procedure - stop drinking clear liquids. Medicines Ask your health care provider about:  Changing or stopping your regular medicines. This is especially important if you are taking diabetes medicines or blood thinners.  Taking medicines such as aspirin and ibuprofen. These medicines can thin your blood. Do not take these medicines unless your health care provider tells you to take them.  Taking over-the-counter medicines, vitamins, herbs, and supplements. Do not take these during the week before your procedure unless your health care provider approves them. General instructions  Starting 3-6 weeks before the procedure, do not use any products that contain nicotine or tobacco, such as cigarettes and e-cigarettes. If you need help quitting, ask your health care provider.  If you brush your teeth on the morning of the procedure, make sure to spit out all of the toothpaste.  Tell your health care provider if you become ill or develop a cold, cough, or fever.  If instructed by your health care provider, bring your sleep apnea device with you on the day of your surgery (if applicable).  Ask your health care provider if you will be going home the same day, the following day, or after a longer hospital stay. ? Plan to have someone take you home from the hospital or clinic. ? Plan to have a responsible adult care for you for at least 24 hours after you leave the hospital or clinic. This is important. What happens during the procedure?   You will be given anesthetics through both of the following: ? A mask placed over your nose and mouth. ? An IV in one of your veins.  You may receive a medicine to help you relax (sedative).  After you are unconscious, a breathing tube may be inserted down your throat to help you breathe. This will be removed before you wake up.  An anesthesia specialist will stay with you throughout your  procedure. He or she will: ? Keep you comfortable and safe by continuing to give you medicines and adjusting the amount of medicine that you get. ? Monitor your blood pressure, pulse, and oxygen levels  to make sure that the anesthetics do not cause any problems. The procedure may vary among health care providers and hospitals. What happens after the procedure?  Your blood pressure, temperature, heart rate, breathing rate, and blood oxygen level will be monitored until the medicines you were given have worn off.  You will wake up in a recovery area. You may wake up slowly.  If you feel anxious or agitated, you may be given medicine to help you calm down.  If you will be going home the same day, your health care provider may check to make sure you can walk, drink, and urinate.  Your health care provider will treat any pain or side effects you have before you go home.  Do not drive for 24 hours if you were given a sedative. Summary  General anesthesia is used to keep you still and prevent pain during a procedure.  It is important to tell your health care provider about your medical history and any surgeries you have had, and previous experience with anesthesia.  Follow your health care provider's instructions about when to stop eating, drinking, or taking certain medicines before your procedure.  Plan to have someone take you home from the hospital or clinic. This information is not intended to replace advice given to you by your health care provider. Make sure you discuss any questions you have with your health care provider. Document Released: 04/13/2007 Document Revised: 05/24/2017 Document Reviewed: 08/20/2016 Elsevier Interactive Patient Education  2019 [place] Anesthesia, Adult, Care After This sheet gives you information about how to care for yourself after your procedure. Your health care provider may also give you more specific instructions. If you have problems or  questions, contact your health care provider. What can I expect after the procedure? After the procedure, the following side effects are common:  Pain or discomfort at the IV site.  Nausea.  Vomiting.  Sore throat.  Trouble concentrating.  Feeling cold or chills.  Weak or tired.  Sleepiness and fatigue.  Soreness and body aches. These side effects can affect parts of the body that were not involved in surgery. Follow these instructions at home:  For at least 24 hours after the procedure:  Have a responsible adult stay with you. It is important to have someone help care for you until you are awake and alert.  Rest as needed.  Do not: ? Participate in activities in which you could fall or become injured. ? Drive. ? Use heavy machinery. ? Drink alcohol. ? Take sleeping pills or medicines that cause drowsiness. ? Make important decisions or sign legal documents. ? Take care of children on your own. Eating and drinking  Follow any instructions from your health care provider about eating or drinking restrictions.  When you feel hungry, start by eating small amounts of foods that are soft and easy to digest (bland), such as toast. Gradually return to your regular diet.  Drink enough fluid to keep your urine pale yellow.  If you vomit, rehydrate by drinking water, juice, or clear broth. General instructions  If you have sleep apnea, surgery and certain medicines can increase your risk for breathing problems. Follow instructions from your health care provider about wearing your sleep device: ? Anytime you are sleeping, including during daytime naps. ? While taking prescription pain medicines, sleeping medicines, or medicines that make you drowsy.  Return to your normal activities as told by your health care provider. Ask your health care provider what activities  are safe for you.  Take over-the-counter and prescription medicines only as told by your health care  provider.  If you smoke, do not smoke without supervision.  Keep all follow-up visits as told by your health care provider. This is important. Contact a health care provider if:  You have nausea or vomiting that does not get better with medicine.  You cannot eat or drink without vomiting.  You have pain that does not get better with medicine.  You are unable to pass urine.  You develop a skin rash.  You have a fever.  You have redness around your IV site that gets worse. Get help right away if:  You have difficulty breathing.  You have chest pain.  You have blood in your urine or stool, or you vomit blood. Summary  After the procedure, it is common to have a sore throat or nausea. It is also common to feel tired.  Have a responsible adult stay with you for the first 24 hours after general anesthesia. It is important to have someone help care for you until you are awake and alert.  When you feel hungry, start by eating small amounts of foods that are soft and easy to digest (bland), such as toast. Gradually return to your regular diet.  Drink enough fluid to keep your urine pale yellow.  Return to your normal activities as told by your health care provider. Ask your health care provider what activities are safe for you. This information is not intended to replace advice given to you by your health care provider. Make sure you discuss any questions you have with your health care provider. Document Released: 04/12/2000 Document Revised: 08/20/2016 Document Reviewed: 08/20/2016 Elsevier Interactive Patient Education  2019 [employer].

## 2018-01-25 ENCOUNTER — Encounter (HOSPITAL_COMMUNITY)
Admission: RE | Admit: 2018-01-25 | Discharge: 2018-01-25 | Disposition: A | Discharge status: Home / Self Care | Payer: Medicare Other | Source: Ambulatory Visit | Attending: General Surgery | Admitting: General Surgery

## 2018-01-25 ENCOUNTER — Encounter (HOSPITAL_COMMUNITY): Payer: Self-pay

## 2018-01-25 ENCOUNTER — Other Ambulatory Visit: Payer: Self-pay

## 2018-01-25 DIAGNOSIS — Z01818 Encounter for other preprocedural examination: Secondary | ICD-10-CM | POA: Insufficient documentation

## 2018-01-25 LAB — CBC
HCT: 42.5 % (ref 36.0–46.0)
Hemoglobin: 13.8 g/dL (ref 12.0–15.0)
MCH: 30.1 pg (ref 26.0–34.0)
MCHC: 32.5 g/dL (ref 30.0–36.0)
MCV: 92.8 fL (ref 80.0–100.0)
NRBC: 0 % (ref 0.0–0.2)
Platelets: 244 10*3/uL (ref 150–400)
RBC: 4.58 MIL/uL (ref 3.87–5.11)
RDW: 13.2 % (ref 11.5–15.5)
WBC: 7.8 10*3/uL (ref 4.0–10.5)

## 2018-01-25 LAB — BASIC METABOLIC PANEL
Anion gap: 8 (ref 5–15)
BUN: 19 mg/dL (ref 8–23)
CO2: 29 mmol/L (ref 22–32)
Calcium: 8.7 mg/dL — ABNORMAL LOW (ref 8.9–10.3)
Chloride: 101 mmol/L (ref 98–111)
Creatinine, Ser: 0.74 mg/dL (ref 0.44–1.00)
GFR calc non Af Amer: >60 mL/min (ref >60)
Glucose, Bld: 94 mg/dL (ref 70–99)
Potassium: 3.4 mmol/L — ABNORMAL LOW (ref 3.5–5.1)
Sodium: 138 mmol/L (ref 135–145)

## 2018-01-30 ENCOUNTER — Ambulatory Visit (HOSPITAL_COMMUNITY): Payer: Medicare Other | Admitting: Anesthesiology

## 2018-01-30 ENCOUNTER — Other Ambulatory Visit: Payer: Self-pay

## 2018-01-30 ENCOUNTER — Encounter (HOSPITAL_COMMUNITY)
Admission: RE | Disposition: A | Discharge status: Home / Self Care | Payer: Self-pay | Source: Home / Self Care | Attending: General Surgery

## 2018-01-30 ENCOUNTER — Ambulatory Visit (HOSPITAL_COMMUNITY)
Admission: RE | Admit: 2018-01-30 | Discharge: 2018-01-30 | Disposition: A | Discharge status: Home / Self Care | Payer: Medicare Other | Attending: General Surgery | Admitting: General Surgery

## 2018-01-30 ENCOUNTER — Encounter (HOSPITAL_COMMUNITY): Payer: Self-pay | Admitting: *Deleted

## 2018-01-30 DIAGNOSIS — Z888 Allergy status to other drugs, medicaments and biological substances status: Secondary | ICD-10-CM | POA: Diagnosis not present

## 2018-01-30 DIAGNOSIS — K219 Gastro-esophageal reflux disease without esophagitis: Secondary | ICD-10-CM | POA: Insufficient documentation

## 2018-01-30 DIAGNOSIS — M1990 Unspecified osteoarthritis, unspecified site: Secondary | ICD-10-CM | POA: Insufficient documentation

## 2018-01-30 DIAGNOSIS — Z9841 Cataract extraction status, right eye: Secondary | ICD-10-CM | POA: Insufficient documentation

## 2018-01-30 DIAGNOSIS — I10 Essential (primary) hypertension: Secondary | ICD-10-CM | POA: Diagnosis not present

## 2018-01-30 DIAGNOSIS — Z809 Family history of malignant neoplasm, unspecified: Secondary | ICD-10-CM | POA: Insufficient documentation

## 2018-01-30 DIAGNOSIS — R222 Localized swelling, mass and lump, trunk: Secondary | ICD-10-CM | POA: Diagnosis present

## 2018-01-30 DIAGNOSIS — Z885 Allergy status to narcotic agent status: Secondary | ICD-10-CM | POA: Diagnosis not present

## 2018-01-30 DIAGNOSIS — Z9049 Acquired absence of other specified parts of digestive tract: Secondary | ICD-10-CM | POA: Insufficient documentation

## 2018-01-30 DIAGNOSIS — Z882 Allergy status to sulfonamides status: Secondary | ICD-10-CM | POA: Insufficient documentation

## 2018-01-30 DIAGNOSIS — Z8249 Family history of ischemic heart disease and other diseases of the circulatory system: Secondary | ICD-10-CM | POA: Insufficient documentation

## 2018-01-30 DIAGNOSIS — Z9842 Cataract extraction status, left eye: Secondary | ICD-10-CM | POA: Insufficient documentation

## 2018-01-30 DIAGNOSIS — Z7982 Long term (current) use of aspirin: Secondary | ICD-10-CM | POA: Insufficient documentation

## 2018-01-30 DIAGNOSIS — Z791 Long term (current) use of non-steroidal anti-inflammatories (NSAID): Secondary | ICD-10-CM | POA: Insufficient documentation

## 2018-01-30 DIAGNOSIS — D171 Benign lipomatous neoplasm of skin and subcutaneous tissue of trunk: Secondary | ICD-10-CM | POA: Diagnosis present

## 2018-01-30 DIAGNOSIS — Z79899 Other long term (current) drug therapy: Secondary | ICD-10-CM | POA: Diagnosis not present

## 2018-01-30 DIAGNOSIS — R1900 Intra-abdominal and pelvic swelling, mass and lump, unspecified site: Secondary | ICD-10-CM | POA: Diagnosis not present

## 2018-01-30 HISTORY — PX: MASS EXCISION: SHX2000

## 2018-01-30 SURGERY — EXCISION MASS
Anesthesia: General

## 2018-01-30 MED ORDER — LACTATED RINGERS IV SOLN
INTRAVENOUS | Status: DC
  Administered 2018-01-30: 10:00:00 via INTRAVENOUS

## 2018-01-30 MED ORDER — GLYCOPYRROLATE 0.2 MG/ML IJ SOLN
INTRAMUSCULAR | Status: DC | PRN
  Administered 2018-01-30: 0.2 mg via INTRAVENOUS

## 2018-01-30 MED ORDER — SUCCINYLCHOLINE CHLORIDE 200 MG/10ML IV SOSY
PREFILLED_SYRINGE | INTRAVENOUS | Status: AC
  Filled 2018-01-30: qty 10

## 2018-01-30 MED ORDER — KETOROLAC TROMETHAMINE 15 MG/ML IJ SOLN
INTRAMUSCULAR | Status: AC
  Filled 2018-01-30: qty 1

## 2018-01-30 MED ORDER — BUPIVACAINE HCL (PF) 0.5 % IJ SOLN
INTRAMUSCULAR | Status: AC
  Filled 2018-01-30: qty 30

## 2018-01-30 MED ORDER — TRAMADOL HCL 50 MG PO TABS: 50.0000 mg | ORAL_TABLET | Freq: Four times a day (QID) | ORAL | 0 refills | Status: DC | PRN

## 2018-01-30 MED ORDER — BUPIVACAINE HCL (PF) 0.5 % IJ SOLN
INTRAMUSCULAR | Status: DC | PRN
  Administered 2018-01-30: 20 mL

## 2018-01-30 MED ORDER — PHENYLEPHRINE 40 MCG/ML (10ML) SYRINGE FOR IV PUSH (FOR BLOOD PRESSURE SUPPORT)
PREFILLED_SYRINGE | INTRAVENOUS | Status: AC
  Filled 2018-01-30: qty 10

## 2018-01-30 MED ORDER — PROPOFOL 10 MG/ML IV BOLUS
INTRAVENOUS | Status: DC | PRN
  Administered 2018-01-30: 20 mg via INTRAVENOUS
  Administered 2018-01-30: 120 mg via INTRAVENOUS

## 2018-01-30 MED ORDER — ROCURONIUM BROMIDE 10 MG/ML (PF) SYRINGE
PREFILLED_SYRINGE | INTRAVENOUS | Status: AC
  Filled 2018-01-30: qty 10

## 2018-01-30 MED ORDER — PHENYLEPHRINE HCL 10 MG/ML IJ SOLN
INTRAMUSCULAR | Status: DC | PRN
  Administered 2018-01-30 (×3): 80 ug via INTRAVENOUS
  Administered 2018-01-30: 40 ug via INTRAVENOUS

## 2018-01-30 MED ORDER — PROPOFOL 10 MG/ML IV BOLUS
INTRAVENOUS | Status: AC
  Filled 2018-01-30: qty 20

## 2018-01-30 MED ORDER — GLYCOPYRROLATE PF 0.2 MG/ML IJ SOSY
PREFILLED_SYRINGE | INTRAMUSCULAR | Status: AC
  Filled 2018-01-30: qty 1

## 2018-01-30 MED ORDER — EPHEDRINE SULFATE 50 MG/ML IJ SOLN
INTRAMUSCULAR | Status: DC | PRN
  Administered 2018-01-30: 5 mg via INTRAVENOUS
  Administered 2018-01-30: 10 mg via INTRAVENOUS
  Administered 2018-01-30: 5 mg via INTRAVENOUS

## 2018-01-30 MED ORDER — DOCUSATE SODIUM 100 MG PO CAPS: 100.0000 mg | ORAL_CAPSULE | Freq: Two times a day (BID) | ORAL | 2 refills | Status: DC

## 2018-01-30 MED ORDER — EPHEDRINE 5 MG/ML INJ
INTRAVENOUS | Status: AC
  Filled 2018-01-30: qty 10

## 2018-01-30 MED ORDER — PROMETHAZINE HCL 25 MG/ML IJ SOLN: 6.2500 mg | INTRAMUSCULAR | Status: DC | PRN

## 2018-01-30 MED ORDER — CHLORHEXIDINE GLUCONATE CLOTH 2 % EX PADS: 6.0000 | MEDICATED_PAD | Freq: Once | CUTANEOUS | Status: DC

## 2018-01-30 MED ORDER — HYDROCODONE-ACETAMINOPHEN 7.5-325 MG PO TABS
ORAL_TABLET | ORAL | Status: AC
  Filled 2018-01-30: qty 1

## 2018-01-30 MED ORDER — ARTIFICIAL TEARS OPHTHALMIC OINT
TOPICAL_OINTMENT | OPHTHALMIC | Status: AC
  Filled 2018-01-30: qty 3.5

## 2018-01-30 MED ORDER — 0.9 % SODIUM CHLORIDE (POUR BTL) OPTIME
TOPICAL | Status: DC | PRN
  Administered 2018-01-30: 1000 mL

## 2018-01-30 MED ORDER — HYDROCODONE-ACETAMINOPHEN 7.5-325 MG PO TABS
1.0000 | ORAL_TABLET | Freq: Four times a day (QID) | ORAL | Status: AC | PRN
  Administered 2018-01-30: 1 via ORAL

## 2018-01-30 MED ORDER — CEFAZOLIN SODIUM-DEXTROSE 2-4 GM/100ML-% IV SOLN
INTRAVENOUS | Status: AC
  Filled 2018-01-30: qty 100

## 2018-01-30 MED ORDER — FENTANYL CITRATE (PF) 100 MCG/2ML IJ SOLN
INTRAMUSCULAR | Status: AC
  Filled 2018-01-30: qty 2

## 2018-01-30 MED ORDER — FLUCONAZOLE 150 MG PO TABS: 150.0000 mg | ORAL_TABLET | Freq: Every day | ORAL | 0 refills | Status: AC

## 2018-01-30 MED ORDER — KETOROLAC TROMETHAMINE 15 MG/ML IJ SOLN
15.0000 mg | Freq: Once | INTRAMUSCULAR | Status: AC
  Administered 2018-01-30: 15 mg via INTRAVENOUS

## 2018-01-30 MED ORDER — CEFAZOLIN SODIUM-DEXTROSE 2-4 GM/100ML-% IV SOLN
2.0000 g | INTRAVENOUS | Status: AC
  Administered 2018-01-30: 2 g via INTRAVENOUS

## 2018-01-30 MED ORDER — MIDAZOLAM HCL 2 MG/2ML IJ SOLN: 0.5000 mg | Freq: Once | INTRAMUSCULAR | Status: DC | PRN

## 2018-01-30 MED ORDER — FENTANYL CITRATE (PF) 100 MCG/2ML IJ SOLN
INTRAMUSCULAR | Status: DC | PRN
  Administered 2018-01-30: 25 ug via INTRAVENOUS

## 2018-01-30 SURGICAL SUPPLY — 46 items
ADH SKN CLS APL DERMABOND .7 (GAUZE/BANDAGES/DRESSINGS)
BLADE HEX COATED 2.75 (ELECTRODE) ×2 IMPLANT
CHLORAPREP W/TINT 10.5 ML (MISCELLANEOUS) ×1 IMPLANT
CHLORAPREP W/TINT 26ML (MISCELLANEOUS) ×2 IMPLANT
CLOTH BEACON ORANGE TIMEOUT ST (SAFETY) ×3 IMPLANT
COVER LIGHT HANDLE STERIS (MISCELLANEOUS) ×6 IMPLANT
COVER WAND RF STERILE (DRAPES) ×2 IMPLANT
DECANTER SPIKE VIAL GLASS SM (MISCELLANEOUS) ×3 IMPLANT
DERMABOND ADVANCED (GAUZE/BANDAGES/DRESSINGS)
DERMABOND ADVANCED .7 DNX12 (GAUZE/BANDAGES/DRESSINGS) IMPLANT
DRAPE EENT ADH APERT 31X51 STR (DRAPES) IMPLANT
DRAPE HALF SHEET 40X57 (DRAPES) ×4 IMPLANT
DRAPE UTILITY W/TAPE 26X15 (DRAPES) ×2 IMPLANT
DRSG TEGADERM 2-3/8X2-3/4 SM (GAUZE/BANDAGES/DRESSINGS) ×4 IMPLANT
DRSG TEGADERM 4X4.75 (GAUZE/BANDAGES/DRESSINGS) ×4 IMPLANT
ELECT NDL TIP 2.8 STRL (NEEDLE) IMPLANT
ELECT NEEDLE TIP 2.8 STRL (NEEDLE) IMPLANT
ELECT REM PT RETURN 9FT ADLT (ELECTROSURGICAL) ×3
ELECTRODE REM PT RTRN 9FT ADLT (ELECTROSURGICAL) ×1 IMPLANT
GLOVE BIO SURGEON STRL SZ 6.5 (GLOVE) ×2 IMPLANT
GLOVE BIO SURGEONS STRL SZ 6.5 (GLOVE) ×1
GLOVE BIOGEL M 7.0 STRL (GLOVE) ×2 IMPLANT
GLOVE BIOGEL PI IND STRL 6.5 (GLOVE) ×1 IMPLANT
GLOVE BIOGEL PI IND STRL 7.0 (GLOVE) ×1 IMPLANT
GLOVE BIOGEL PI INDICATOR 6.5 (GLOVE) ×2
GLOVE BIOGEL PI INDICATOR 7.0 (GLOVE) ×2
GOWN STRL REUS W/ TWL XL LVL3 (GOWN DISPOSABLE) ×1 IMPLANT
GOWN STRL REUS W/TWL LRG LVL3 (GOWN DISPOSABLE) ×3 IMPLANT
GOWN STRL REUS W/TWL XL LVL3 (GOWN DISPOSABLE) ×3
KIT TURNOVER KIT A (KITS) ×3 IMPLANT
MANIFOLD NEPTUNE II (INSTRUMENTS) ×3 IMPLANT
NDL HYPO 25X1 1.5 SAFETY (NEEDLE) ×1 IMPLANT
NEEDLE HYPO 25X1 1.5 SAFETY (NEEDLE) ×3 IMPLANT
NS IRRIG 1000ML POUR BTL (IV SOLUTION) ×3 IMPLANT
PACK MINOR (CUSTOM PROCEDURE TRAY) IMPLANT
PAD ARMBOARD 7.5X6 YLW CONV (MISCELLANEOUS) ×3 IMPLANT
SET BASIN LINEN APH (SET/KITS/TRAYS/PACK) ×3 IMPLANT
SPONGE GAUZE 2X2 8PLY STER LF (GAUZE/BANDAGES/DRESSINGS) ×6
SPONGE GAUZE 2X2 8PLY STRL LF (GAUZE/BANDAGES/DRESSINGS) ×6 IMPLANT
STAPLER VISISTAT (STAPLE) ×2 IMPLANT
SUT ETHILON 3 0 FSL (SUTURE) IMPLANT
SUT MNCRL AB 4-0 PS2 18 (SUTURE) ×3 IMPLANT
SUT PROLENE 4 0 PS 2 18 (SUTURE) IMPLANT
SUT VIC AB 3-0 SH 27 (SUTURE) ×6
SUT VIC AB 3-0 SH 27X BRD (SUTURE) IMPLANT
SYR CONTROL 10ML LL (SYRINGE) ×3 IMPLANT

## 2018-01-30 NOTE — Anesthesia Procedure Notes (Signed)
Procedure Name: Intubation Performed by: Vista Deck, CRNA Pre-anesthesia Checklist: Patient identified, Patient being monitored, Timeout performed, Emergency Drugs available and Suction available Patient Re-evaluated:Patient Re-evaluated prior to induction Oxygen Delivery Method: Circle System Utilized Preoxygenation: Pre-oxygenation with 100% oxygen Induction Type: IV induction Ventilation: Mask ventilation without difficulty Laryngoscope Size: Miller and 2 Grade View: Grade I Tube type: Oral Tube size: 7.0 mm Number of attempts: 1 Airway Equipment and Method: stylet Placement Confirmation: ETT inserted through vocal cords under direct vision,  positive ETCO2 and breath sounds checked- equal and bilateral Secured at: 22 cm Tube secured with: Tape Dental Injury: Teeth and Oropharynx as per pre-operative assessment

## 2018-01-30 NOTE — Anesthesia Postprocedure Evaluation (Signed)
Anesthesia Post Note  Patient: Anna Bradford  Procedure(s) Performed: EXCISION MASS 3CM ON BACK AND 3CM ON ABDOMEN (N/A )  Patient location during evaluation: PACU Anesthesia Type: General Level of consciousness: awake, awake and alert and oriented Pain management: pain level controlled Vital Signs Assessment: post-procedure vital signs reviewed and stable Respiratory status: spontaneous breathing, nonlabored ventilation and respiratory function stable Cardiovascular status: blood pressure returned to baseline Postop Assessment: no apparent nausea or vomiting Anesthetic complications: no     Last Vitals:  Vitals:   01/30/18 1230 01/30/18 1245  BP: 118/77 (!) 117/97  Pulse: 83 76  Resp: 12 16  Temp:    SpO2: 97%     Last Pain:  Vitals:   01/30/18 1230  TempSrc:   PainSc: 6                  Vandora Jaskulski J

## 2018-01-30 NOTE — Transfer of Care (Signed)
Immediate Anesthesia Transfer of Care Note  Patient: Anna Bradford  Procedure(s) Performed: EXCISION MASS 3CM ON BACK AND 3CM ON ABDOMEN (N/A )  Patient Location: PACU  Anesthesia Type:General  Level of Consciousness: awake and patient cooperative  Airway & Oxygen Therapy: Patient Spontanous Breathing and Patient connected to face mask oxygen  Post-op Assessment: Report given to RN, Post -op Vital signs reviewed and stable and Patient moving all extremities  Post vital signs: Reviewed and stable  Last Vitals:  Vitals Value Taken Time  BP    Temp    Pulse    Resp    SpO2      Last Pain:  Vitals:   01/30/18 0921  TempSrc: Oral  PainSc: 4       Patients Stated Pain Goal: 6 (16/10/96 0454)  Complications: No apparent anesthesia complications

## 2018-01-30 NOTE — Anesthesia Preprocedure Evaluation (Signed)
Anesthesia Evaluation  Patient identified by MRN, date of birth, ID band Patient awake    Reviewed: Allergy & Precautions, NPO status , Patient's Chart, lab work & pertinent test results  Airway Mallampati: II  TM Distance: >3 FB Neck ROM: Full    Dental no notable dental hx.    Pulmonary neg pulmonary ROS,    Pulmonary exam normal breath sounds clear to auscultation       Cardiovascular Exercise Tolerance: Poor hypertension, Pt. on medications negative cardio ROS Normal cardiovascular examII Rhythm:Regular Rate:Normal     Neuro/Psych negative neurological ROS  negative psych ROS   GI/Hepatic Neg liver ROS, GERD  Medicated and Controlled,  Endo/Other  negative endocrine ROS  Renal/GU negative Renal ROS  negative genitourinary   Musculoskeletal  (+) Arthritis ,   Abdominal   Peds negative pediatric ROS (+)  Hematology negative hematology ROS (+)   Anesthesia Other Findings   Reproductive/Obstetrics negative OB ROS                             Anesthesia Physical Anesthesia Plan  ASA: III  Anesthesia Plan: General   Post-op Pain Management:    Induction: Intravenous  PONV Risk Score and Plan:   Airway Management Planned: LMA  Additional Equipment:   Intra-op Plan:   Post-operative Plan:   Informed Consent: I have reviewed the patients History and Physical, chart, labs and discussed the procedure including the risks, benefits and alternatives for the proposed anesthesia with the patient or authorized representative who has indicated his/her understanding and acceptance.   Dental advisory given  Plan Discussed with: CRNA  Anesthesia Plan Comments:         Anesthesia Quick Evaluation

## 2018-01-30 NOTE — Discharge Instructions (Signed)
Discharge Instructions:  Diet/ Activity: Diet as tolerated.  Activity as tolerated. Do not do any moving/ stretching/ pulling/ reaching that pulls on your incisions.   Medication: Take tylenol and ibuprofen as needed for pain control, alternating every 4-6 hours.  Example:  Tylenol 1000mg  @ 6am, 12noon, 6pm, 63midnight (Do not exceed 4000mg  of tylenol a day). Ibuprofen 800mg  @ 9am, 3pm, 9pm, 3am (Do not exceed 3600mg  of ibuprofen a day).  Take Tramadol for breakthrough pain every 6 hours as needed.  Take Colace for constipation related to narcotic pain medication. If you do not have a bowel movement in 2 days, take Miralax over the counter.  Drink plenty of water to also prevent constipation.   Contact Information: If you have questions or concerns, please call our office, [phone], Monday- Thursday 8AM-5PM and Friday 8AM-12Noon. If it is after hours or on the weekend, please call [facility], [phone], and ask to speak to the surgeon on call for Dr. Constance Haw at [facility].   Lipoma Removal, Care After Refer to this sheet in the next few weeks. These instructions provide you with information about caring for yourself after your procedure. Your health care provider may also give you more specific instructions. Your treatment has been planned according to current medical practices, but problems sometimes occur. Call your health care provider if you have any problems or questions after your procedure. What can I expect after the procedure? After the procedure, it is common to have:  Mild pain.  Swelling.  Bruising. Follow these instructions at home: Bathing  Do not take baths, swim, or use a hot tub until your health care provider approves. You may shower.   Keep your bandage (dressing) dry until Wednesday, remove this.    You can replace with bandaids as desired.  Incision care   Follow instructions from your health care provider about how to take care of your  incision. Make sure you: ? Wash your hands with soap and water before you change your bandage (dressing). If soap and water are not available, use hand sanitizer. ? Remove the dressing on Wednesday and replace with bandaids as desired.  ? The staples will remain in place until seen in the office.   Check your incision area every day for signs of infection. Check for: ? More redness, swelling, or pain. ? Fluid or blood. ? Warmth. ? Pus or a bad smell. Driving  Do not drive or operate heavy machinery while taking prescription pain medicine.  Do not drive for 24 hours if you received a medicine to help you relax (sedative) during your procedure.  Ask your health care provider when it is safe for you to drive. General instructions  Take over-the-counter and prescription medicines only as told by your health care provider.  Do not use any tobacco products, such as cigarettes, chewing tobacco, and e-cigarettes. These can delay healing. If you need help quitting, ask your health care provider.  Return to your normal activities as told by your health care provider. Ask your health care provider what activities are safe for you.  Keep all follow-up visits as told by your health care provider. This is important. Contact a health care provider if:  You have more redness, swelling, or pain around your incision.  You have fluid or blood coming from your incision.  Your incision feels warm to the touch.  You have pus or a bad smell coming from your incision.  You have pain that does not get better  with medicine. Get help right away if:  You have chills or a fever.  You have severe pain. This information is not intended to replace advice given to you by your health care provider. Make sure you discuss any questions you have with your health care provider. Document Released: 03/20/2015 Document Revised: 06/17/2015 Document Reviewed: 03/20/2015 Elsevier Interactive Patient Education  2019  [employer]   Vaginal Yeast infection, Adult  Vaginal yeast infection is a condition that causes vaginal discharge as well as soreness, swelling, and redness (inflammation) of the vagina. This is a common condition. Some women get this infection frequently. What are the causes? This condition is caused by a change in the normal balance of the yeast (candida) and bacteria that live in the vagina. This change causes an overgrowth of yeast, which causes the inflammation. What increases the risk? The condition is more likely to develop in women who:  Take antibiotic medicines.  Have diabetes.  Take birth control pills.  Are pregnant.  Douche often.  Have a weak body defense system (immune system).  Have been taking steroid medicines for a long time.  Frequently wear tight clothing. What are the signs or symptoms? Symptoms of this condition include:  White, thick, creamy vaginal discharge.  Swelling, itching, redness, and irritation of the vagina. The lips of the vagina (vulva) may be affected as well.  Pain or a burning feeling while urinating.  Pain during sex. How is this diagnosed? This condition is diagnosed based on:  Your medical history.  A physical exam.  A pelvic exam. Your health care provider will examine a sample of your vaginal discharge under a microscope. Your health care provider may send this sample for testing to confirm the diagnosis. How is this treated? This condition is treated with medicine. Medicines may be over-the-counter or prescription. You may be told to use one or more of the following:  Medicine that is taken by mouth (orally).  Medicine that is applied as a cream (topically).  Medicine that is inserted directly into the vagina (suppository). Follow these instructions at home:  Lifestyle  Do not have sex until your health care provider approves. Tell your sex partner that you have a yeast infection. That person should go to his or  her health care provider and ask if they should also be treated.  Do not wear tight clothes, such as pantyhose or tight pants.  Wear breathable cotton underwear. General instructions  Take or apply over-the-counter and prescription medicines only as told by your health care provider.  Eat more yogurt. This may help to keep your yeast infection from returning.  Do not use tampons until your health care provider approves.  Try taking a sitz bath to help with discomfort. This is a warm water bath that is taken while you are sitting down. The water should only come up to your hips and should cover your buttocks. Do this 3-4 times per day or as told by your health care provider.  Do not douche.  If you have diabetes, keep your blood sugar levels under control.  Keep all follow-up visits as told by your health care provider. This is important. Contact a health care provider if:  You have a fever.  Your symptoms go away and then return.  Your symptoms do not get better with treatment.  Your symptoms get worse.  You have new symptoms.  You develop blisters in or around your vagina.  You have blood coming from your vagina and  it is not your menstrual period.  You develop pain in your abdomen. Summary  Vaginal yeast infection is a condition that causes discharge as well as soreness, swelling, and redness (inflammation) of the vagina.  This condition is treated with medicine. Medicines may be over-the-counter or prescription.  Take or apply over-the-counter and prescription medicines only as told by your health care provider.  Do not douche. Do not have sex or use tampons until your health care provider approves.  Contact a health care provider if your symptoms do not get better with treatment or your symptoms go away and then return. This information is not intended to replace advice given to you by your health care provider. Make sure you discuss any questions you have with  your health care provider. Document Released: 10/14/2004 Document Revised: 05/23/2017 Document Reviewed: 05/23/2017 Elsevier Interactive Patient Education  2019 [place] Anesthesia, Adult, Care After This sheet gives you information about how to care for yourself after your procedure. Your health care provider may also give you more specific instructions. If you have problems or questions, contact your health care provider. What can I expect after the procedure? After the procedure, the following side effects are common:  Pain or discomfort at the IV site.  Nausea.  Vomiting.  Sore throat.  Trouble concentrating.  Feeling cold or chills.  Weak or tired.  Sleepiness and fatigue.  Soreness and body aches. These side effects can affect parts of the body that were not involved in surgery. Follow these instructions at home:  For at least 24 hours after the procedure:  Have a responsible adult stay with you. It is important to have someone help care for you until you are awake and alert.  Rest as needed.  Do not: ? Participate in activities in which you could fall or become injured. ? Drive. ? Use heavy machinery. ? Drink alcohol. ? Take sleeping pills or medicines that cause drowsiness. ? Make important decisions or sign legal documents. ? Take care of children on your own. Eating and drinking  Follow any instructions from your health care provider about eating or drinking restrictions.  When you feel hungry, start by eating small amounts of foods that are soft and easy to digest (bland), such as toast. Gradually return to your regular diet.  Drink enough fluid to keep your urine pale yellow.  If you vomit, rehydrate by drinking water, juice, or clear broth. General instructions  If you have sleep apnea, surgery and certain medicines can increase your risk for breathing problems. Follow instructions from your health care provider about wearing your  sleep device: ? Anytime you are sleeping, including during daytime naps. ? While taking prescription pain medicines, sleeping medicines, or medicines that make you drowsy.  Return to your normal activities as told by your health care provider. Ask your health care provider what activities are safe for you.  Take over-the-counter and prescription medicines only as told by your health care provider.  If you smoke, do not smoke without supervision.  Keep all follow-up visits as told by your health care provider. This is important. Contact a health care provider if:  You have nausea or vomiting that does not get better with medicine.  You cannot eat or drink without vomiting.  You have pain that does not get better with medicine.  You are unable to pass urine.  You develop a skin rash.  You have a fever.  You have redness around  your IV site that gets worse. Get help right away if:  You have difficulty breathing.  You have chest pain.  You have blood in your urine or stool, or you vomit blood. Summary  After the procedure, it is common to have a sore throat or nausea. It is also common to feel tired.  Have a responsible adult stay with you for the first 24 hours after general anesthesia. It is important to have someone help care for you until you are awake and alert.  When you feel hungry, start by eating small amounts of foods that are soft and easy to digest (bland), such as toast. Gradually return to your regular diet.  Drink enough fluid to keep your urine pale yellow.  Return to your normal activities as told by your health care provider. Ask your health care provider what activities are safe for you. This information is not intended to replace advice given to you by your health care provider. Make sure you discuss any questions you have with your health care provider. Document Released: 04/12/2000 Document Revised: 08/20/2016 Document Reviewed: 08/20/2016 Elsevier  Interactive Patient Education  2019 [employer].

## 2018-01-30 NOTE — Progress Notes (Signed)
[facility]  Patient with yeast infection that has been treated with nystatin but not improving. Received antibiotics today. Will give diflucan for complicated yeast infection as outpatient given persistent symptoms and antibiotics for surgery.  Sent in to pharmacy, 150mg  X 3 days.   Curlene Labrum, MD [facility] [address] [place], [place] [address] [phone] (office)

## 2018-01-30 NOTE — Interval H&P Note (Signed)
History and Physical Interval Note:  01/30/2018 10:26 AM  Anna Bradford  has presented today for surgery, with the diagnosis of soft tissue mass on back and abdomen  The various methods of treatment have been discussed with the patient and family. After consideration of risks, benefits and other options for treatment, the patient has consented to  Procedure(s): EXCISION MASS 3CM ON BACK AND 3CM ON ABDOMEN (N/A) as a surgical intervention .  The patient's history has been reviewed, patient examined, no change in status, stable for surgery.  I have reviewed the patient's chart and labs.  Questions were answered to the patient's satisfaction.    Marked patient with her palpating the areas and me palpating the areas. Only marked areas where I felt a mass like lesion or induration.  She had other areas that we did not mark as no obviously abnormal mass was noted.  Discussed again with her that this is to possibly remove lipoma but I cannot guarantee that I will find a lipoma at every site we marked or that this will help her pain.  She understands and still wants to proceed.   She has some complaints of a yeast infection. She has been using creams without improvement. Will prescribe diflucan post op.   Virl Cagey

## 2018-01-30 NOTE — Op Note (Signed)
[facility] Operative Note  01/30/18   Preoperative Diagnosis:  Back and abdominal masses    Postoperative Diagnosis: Multiple lipomas removed from back, abdomen > 5cm in total and right hip 3 cm in total    Procedure(s) Performed: Excision of multiple lipomas from back and abdomen > 5cm total, and right hip 3 cm in total    Surgeon: Lanell Matar. Constance Haw, MD   Assistants: No qualified resident was available    Anesthesia: General endotracheal   Anesthesiologist: General anesthesia    Specimens:  Abdominal lipomas, Right hip lipomas, Right back lipomas    Estimated Blood Loss: Minimal   Blood Replacement: None    Complications: None   Wound Class: Clean    Operative Indications: Anna Bradford is a 85 yo with complaints of multiple mass like areas under her skin that cause her pain. She has been seen in my office twice, and I have reviewed imaging and have found possibly some lipomas in the areas she is complaining about. Her biggest complaint was pain and we discussed that this could not help her pain and that excision may not find anything given that nothing discrete is seen on the CT in most of the locations. We discussed that lipomas could be in the area, but that excision of these may not help her pain. She wanted to do something and was at the point where she was willing to take the risk of surgery to get any relief.  We discussed the risk including but not limited to bleeding, infection, recurrence of the lipoma, continued or worsening pain, and she opted to proceed. I marked areas for excision that she and I palpated prior to surgery.   Findings: Multiple lipomatous masses in the subcutaneous tissue removed    Procedure: The patient was taken to the operating room and placed supine. General endotracheal anesthesia was induced. Intravenous antibiotics were administered per protocol.  She was then transitioned to the left lateral decubitus position to expose the right  abdomen, right hip and right back.  All pressure points were padded. The area was prepped and draped in the standard fashion. The palpable areas had been marked in preoperative holding.  I made an incision over the areas on the back, abdomen, and hip and used a combination of blunt dissection and manual pressure to evacute multiple lipomatous masses.  Each area was sent separately to pathology (abdomen, back, and hip).  The cavities were infiltrated with local anesthetic, and hemostasis was confirmed. The deep dermal layer was closed with interrupted 3-0 Vicryl and the skin was closed with staples.  A 2x2 gauze and a tegaderm was placed over the incisions.   Final inspection revealed acceptable hemostasis. All counts were correct at the end of the case. The patient was awakened from anesthesia and extubate without complication.  The patient went to the PACU in stable condition.   Anna Labrum, MD [facility] [address] [place],  [address] [phone] (office)

## 2018-01-31 ENCOUNTER — Encounter (HOSPITAL_COMMUNITY): Payer: Self-pay | Admitting: General Surgery

## 2018-01-31 NOTE — Addendum Note (Signed)
Addendum  created 01/31/18 0814 by Vista Deck, CRNA   Intraprocedure Flowsheets edited

## 2018-02-07 ENCOUNTER — Telehealth: Payer: Self-pay | Admitting: Emergency Medicine

## 2018-02-07 NOTE — Telephone Encounter (Signed)
Patient called and stated she is broke out from the tape that was placed around the surgical site. Patient wanted to know if she can put neosporn on the area. I notified the patient to keep the area dry and clean and not to place anything on it. Patient stated she understood and that she wouldn't. That she would keep the area dry and clean.

## 2018-02-14 ENCOUNTER — Ambulatory Visit (INDEPENDENT_AMBULATORY_CARE_PROVIDER_SITE_OTHER): Payer: Self-pay | Admitting: General Surgery

## 2018-02-14 ENCOUNTER — Encounter: Payer: Self-pay | Admitting: General Surgery

## 2018-02-14 VITALS — BP 192/77 | HR 81 | Temp 98.0°F | Resp 20 | Wt 223.6 lb

## 2018-02-14 DIAGNOSIS — R222 Localized swelling, mass and lump, trunk: Secondary | ICD-10-CM

## 2018-02-14 DIAGNOSIS — R1900 Intra-abdominal and pelvic swelling, mass and lump, unspecified site: Secondary | ICD-10-CM

## 2018-02-14 NOTE — Progress Notes (Signed)
[facility] Note   HPI:  85 y.o. Female presents to clinic for post-op follow-up evaluation after lipoma excision. Patient reports she is doing well.  Review of Systems:  No fever or chills All other review of systems: otherwise negative   Pathology: Diagnosis 1. Soft tissue, lipoma, right hip - MATURE FIBROADIPOSE TISSUE, CLINICALLY LIPOMA. 2. Soft tissue, lipoma, right back - MATURE FIBROADIPOSE TISSUE, CLINICALLY LIPOMA. 3. Soft tissue, lipoma, abdomen - MATURE FIBROADIPOSE TISSUE, CLINICALLY LIPOMA.  Vital Signs:  BP (!) 192/77 (BP Location: Left Arm, Patient Position: Sitting, Cuff Size: Large)   Pulse 81   Temp 98 F (36.7 C) (Temporal)   Resp 20   Wt 223 lb 9.6 oz (101.4 kg)   BMI 37.21 kg/m    Physical Exam:  Physical Exam Vitals signs reviewed.  Cardiovascular:     Rate and Rhythm: Normal rate.  Pulmonary:     Effort: Pulmonary effort is normal.  Abdominal:     General: There is no distension.     Palpations: Abdomen is soft.     Tenderness: There is no abdominal tenderness.  Musculoskeletal:     Comments: Healing sites with staples removed  Skin:    General: Skin is warm and dry.     Assessment:  85 y.o. yo Female s/p excision of lipomas. Doing well.  Plan:  Diet and activity as tolerated. Shower per routine. Strips will fall off in a few days.  Follow up PRN   All of the above recommendations were discussed with the patient and patient's family, and all of patient's and family's questions were answered to her expressed satisfaction.  Curlene Labrum, MD [facility] [address] [place], [place] [address] [phone] (office)

## 2018-02-14 NOTE — Patient Instructions (Signed)
Diet and activity as tolerated. Shower per routine. Strips will fall off in a few days.

## 2018-02-23 ENCOUNTER — Ambulatory Visit: Payer: Medicare Other | Admitting: Orthopaedic Surgery

## 2018-02-28 ENCOUNTER — Ambulatory Visit (INDEPENDENT_AMBULATORY_CARE_PROVIDER_SITE_OTHER): Payer: Medicare Other

## 2018-02-28 ENCOUNTER — Ambulatory Visit: Payer: Medicare Other | Admitting: Orthopaedic Surgery

## 2018-02-28 ENCOUNTER — Encounter: Payer: Self-pay | Admitting: Orthopaedic Surgery

## 2018-02-28 VITALS — BP 192/86 | HR 69 | Ht 65.0 in | Wt 223.0 lb

## 2018-02-28 DIAGNOSIS — G8929 Other chronic pain: Secondary | ICD-10-CM | POA: Diagnosis not present

## 2018-02-28 DIAGNOSIS — M7061 Trochanteric bursitis, right hip: Secondary | ICD-10-CM

## 2018-02-28 DIAGNOSIS — M25562 Pain in left knee: Secondary | ICD-10-CM

## 2018-02-28 NOTE — Progress Notes (Signed)
Patient UX:Anna Bradford, female DOB:[birth year], 85 y.o. DDU:[id number]  Chief Complaint  Patient presents with  . Hip Pain    right / wants injection   . Knee Pain    left    HPI  Anna Bradford is a 85 y.o. female who has recurrence of pain in the right hip trochanteric area. She says it began about a month ago and has gotten worse.  She has another problem:  Left knee pain with swelling, popping and giving way.  This has been going on about six weeks and not getting any better. She has tried advil with some help. She has used heat also.   Body mass index is 37.11 kg/m.  ROS  Review of Systems  HENT: Negative for congestion.   Respiratory: Negative for cough and shortness of breath.   Cardiovascular: Negative for chest pain and leg swelling.  Endocrine: Positive for cold intolerance.  Musculoskeletal: Positive for arthralgias, back pain, gait problem and joint swelling.  Allergic/Immunologic: Positive for environmental allergies.    All other systems reviewed and are negative.  The following is a summary of the past history medically, past history surgically, known current medicines, social history and family history.  This information is gathered electronically by the computer from prior information and documentation.  I review this each visit and have found including this information at this point in the chart is beneficial and informative.    Past Medical History:  Diagnosis Date  . Arthritis   . Cancer [facility]) 1996   left breast  . Essential hypertension, benign   . GERD (gastroesophageal reflux disease)     Past Surgical History:  Procedure Laterality Date  . Absess Off Intestine    . cataract surgery Left 08-10-2011  . cataract surgery Right 11-01-2011  . CHOLECYSTECTOMY     1972  . COLONOSCOPY N/A 02/06/2015   Procedure: COLONOSCOPY;  Surgeon: Rogene Houston, MD;  Location: [facility];  Service: Endoscopy;  Laterality: N/A;  1015  . GALLBLADDER SURGERY      Rupture Lower Stomach  . HERNIA REPAIR    . Hysterectomy ---- unknown     1980  . [place] Tumor    . Left Breast Masectomy     1986  . MASS EXCISION N/A 01/30/2018   Procedure: EXCISION MASS 3CM ON BACK AND 3CM ON ABDOMEN;  Surgeon: Virl Cagey, MD;  Location: [facility] ORS;  Service: General;  Laterality: N/A;  . Right Ankle Surgery      Family History  Problem Relation Age of Onset  . Cancer Mother   . Coronary artery disease Mother   . Heart disease Sister     Social History Social History   Tobacco Use  . Smoking status: Never Smoker  . Smokeless tobacco: Never Used  Substance Use Topics  . Alcohol use: No  . Drug use: No    Allergies  Allergen Reactions  . Macrolides And Ketolides     Bleeding thru bowels  . Codeine Nausea And Vomiting  . Sulfa Antibiotics Nausea And Vomiting    Current Outpatient Medications  Medication Sig Dispense Refill  . Ascorbic Acid (VITAMIN C) 1000 MG tablet Take 1,000 mg by mouth daily.     Marland Kitchen aspirin 81 MG tablet Take 81 mg by mouth daily.    . Cholecalciferol (VITAMIN D-3) 1000 UNITS CAPS Take 2,000 Units by mouth daily.    . diphenhydrAMINE (BENADRYL ALLERGY) 25 MG tablet Take 25 mg by mouth daily  as needed (allergies).     . docusate sodium (COLACE) 100 MG capsule Take 1 capsule (100 mg total) by mouth 2 (two) times daily. 60 capsule 2  . ibuprofen (ADVIL,MOTRIN) 200 MG tablet Take 800 mg by mouth 2 (two) times daily.     Marland Kitchen losartan-hydrochlorothiazide (HYZAAR) 100-25 MG per tablet Take 1 tablet by mouth daily.    . methocarbamol (ROBAXIN) 500 MG tablet Take 500 mg by mouth every 8 (eight) hours as needed for muscle spasms.    . Multiple Minerals-Vitamins (CALCIUM-MAGNESIUM-ZINC-D3 PO) Take 1 tablet by mouth daily.    . Multiple Vitamin (MULTIVITAMIN) tablet Take 1 tablet by mouth daily.    Marland Kitchen nystatin (MYCOSTATIN/NYSTOP) powder Apply 1 g topically daily.    Marland Kitchen nystatin cream (MYCOSTATIN) Apply 1 application topically daily.    Vladimir Faster Glycol-Propyl Glycol (SYSTANE OP) Place 1 drop into both eyes daily as needed (dry eyes).    . potassium gluconate 595 MG TABS Take 595 mg by mouth daily.    . traMADol (ULTRAM) 50 MG tablet Take 1 tablet (50 mg total) by mouth every 6 (six) hours as needed for severe pain. 20 tablet 0  . zinc gluconate 50 MG tablet Take 50 mg by mouth daily.    Marland Kitchen amLODipine (NORVASC) 5 MG tablet Take 5 mg by mouth daily.      No current facility-administered medications for this visit.      Physical Exam  Blood pressure (!) 192/86, pulse 69, height 5\' 5"  (1.651 m), weight 223 lb (101.2 kg).  Constitutional: overall normal hygiene, normal nutrition, well developed, normal grooming, normal body habitus. Assistive device:none  Musculoskeletal: gait and station Limp left, muscle tone and strength are normal, no tremors or atrophy is present.  .  Neurological: coordination overall normal.  Deep tendon reflex/nerve stretch intact.  Sensation normal.  Cranial nerves II-XII intact.   Skin:   Normal overall no scars, lesions, ulcers or rashes. No psoriasis.  Psychiatric: Alert and oriented x 3.  Recent memory intact, remote memory unclear.  Normal mood and affect. Well groomed.  Good eye contact.  Cardiovascular: overall no swelling, no varicosities, no edema bilaterally, normal temperatures of the legs and arms, no clubbing, cyanosis and good capillary refill.  Lymphatic: palpation is normal.  Right hip is tender laterally with no redness or swelling. ROM of the hip is full.  The left lower extremity is examined:  Inspection:  Thigh:  Non-tender and no defects  Knee has swelling 1+ effusion.                        Joint tenderness is present                        Patient is tender over the medial joint line  Lower Leg:  Has normal appearance and no tenderness or defects  Ankle:  Non-tender and no defects  Foot:  Non-tender and no defects Range of Motion:  Knee:  Range of motion is:  0-105                        Crepitus is  present  Ankle:  Range of motion is normal. Strength and Tone:  The left lower extremity has normal strength and tone. Stability:  Knee:  The knee is stable.  Ankle:  The ankle is stable.   All other systems reviewed and are negative  X-rays were done of the left knee, reported separately. DJD is present.  The patient has been educated about the nature of the problem(s) and counseled on treatment options.  The patient appeared to understand what I have discussed and is in agreement with it.  Encounter Diagnoses  Name Primary?  . Chronic pain of left knee Yes  . Greater trochanteric bursitis of right hip    PROCEDURE NOTE:  The patient requests injections of the left knee , verbal consent was obtained.  The left knee was prepped appropriately after time out was performed.   Sterile technique was observed and injection of 1 cc of Depo-Medrol 40 mg with several cc's of plain xylocaine. Anesthesia was provided by ethyl chloride and a 20-gauge needle was used to inject the knee area. The injection was tolerated well.  A band aid dressing was applied.  The patient was advised to apply ice later today and tomorrow to the injection sight as needed.  PROCEDURE NOTE:  The patient request injection, verbal consent was obtained.  The right trochanteric area of the hip was prepped appropriately after time out was performed.   Sterile technique was observed and injection of 1 cc of Depo-Medrol 40 mg with several cc's of plain xylocaine. Anesthesia was provided by ethyl chloride and a 20-gauge needle was used to inject the hip area. The injection was tolerated well.  A band aid dressing was applied.  The patient was advised to apply ice later today and tomorrow to the injection sight as needed.   PLAN Call if any problems.  Precautions discussed.  Continue current medications.   Return to clinic 3 weeks   Electronically Signed Sanjuana Kava, MD 2/11/202011:01 AM

## 2018-03-21 ENCOUNTER — Ambulatory Visit: Payer: Medicare Other | Admitting: Orthopaedic Surgery

## 2018-07-28 ENCOUNTER — Ambulatory Visit: Payer: Medicare Other | Admitting: Family Medicine

## 2018-09-07 ENCOUNTER — Other Ambulatory Visit: Payer: Self-pay

## 2018-09-11 ENCOUNTER — Other Ambulatory Visit: Payer: Self-pay

## 2018-09-11 ENCOUNTER — Ambulatory Visit (INDEPENDENT_AMBULATORY_CARE_PROVIDER_SITE_OTHER): Payer: Medicare Other | Admitting: Family Medicine

## 2018-09-11 ENCOUNTER — Encounter: Payer: Self-pay | Admitting: Family Medicine

## 2018-09-11 VITALS — BP 166/83 | HR 81 | Temp 98.2°F | Ht 65.0 in | Wt 223.0 lb

## 2018-09-11 DIAGNOSIS — M159 Polyosteoarthritis, unspecified: Secondary | ICD-10-CM

## 2018-09-11 DIAGNOSIS — H00015 Hordeolum externum left lower eyelid: Secondary | ICD-10-CM | POA: Diagnosis not present

## 2018-09-11 DIAGNOSIS — B372 Candidiasis of skin and nail: Secondary | ICD-10-CM

## 2018-09-11 DIAGNOSIS — M150 Primary generalized (osteo)arthritis: Secondary | ICD-10-CM | POA: Diagnosis not present

## 2018-09-11 DIAGNOSIS — M1990 Unspecified osteoarthritis, unspecified site: Secondary | ICD-10-CM | POA: Insufficient documentation

## 2018-09-11 DIAGNOSIS — K219 Gastro-esophageal reflux disease without esophagitis: Secondary | ICD-10-CM | POA: Diagnosis not present

## 2018-09-11 DIAGNOSIS — I10 Essential (primary) hypertension: Secondary | ICD-10-CM | POA: Diagnosis not present

## 2018-09-11 MED ORDER — CIPROFLOXACIN HCL 0.3 % OP SOLN: 1.0000 [drp] | OPHTHALMIC | 0 refills | Status: AC

## 2018-09-11 MED ORDER — AMLODIPINE BESYLATE 5 MG PO TABS: 5.0000 mg | ORAL_TABLET | Freq: Every day | ORAL | 3 refills | Status: DC

## 2018-09-11 MED ORDER — FLUCONAZOLE 150 MG PO TABS: 150.0000 mg | ORAL_TABLET | ORAL | 0 refills | Status: DC

## 2018-09-11 MED ORDER — LOSARTAN POTASSIUM-HCTZ 100-25 MG PO TABS: 1.0000 | ORAL_TABLET | Freq: Every day | ORAL | 3 refills | Status: DC

## 2018-09-11 NOTE — Progress Notes (Signed)
BP (!) 166/83    Pulse 81    Temp 98.2 F (36.8 C) (Temporal)    Ht 5\' 5"  (1.651 m)    Wt 223 lb (101.2 kg)    BMI 37.11 kg/m    Subjective:    Patient ID: Anna Bradford, female    DOB: [birth year], 85 y.o.   MRN: [id number]  HPI: Anna Bradford is a 85 y.o. female presenting on 09/11/2018 for New Patient (Initial Visit) ([place]), Establish Care, and bump on eye (Left eye- bottom eyelid. Patient states it looked like a pimple and popped it x 2 months ago and its still there)   HPI Patient is coming to establish care with our office.  She says the biggest thing that is been bothering her she has a bump on her eyelid, one on the left lower eyelid and then one on the left upper eyelid.  She says the one on the upper eyelid started out like a pimple and she popped it.  She said she started noticing the first one that popped 2 months ago and the other one has been more recently over the past couple weeks.  Hypertension Patient is currently on losartan hydrochlorothiazide and amlodipine, and their blood pressure today is 166/83. Patient denies any lightheadedness or dizziness. Patient denies headaches, blurred vision, chest pains, shortness of breath, or weakness. Denies any side effects from medication and is content with current medication.   GERD Patient is currently on no medication currently but has been diagnosed with the previously and sometimes takes over-the-counter medicines.  She denies any major symptoms or abdominal pain or belching or burping. She denies any blood in her stool or lightheadedness or dizziness.   Patient has been diagnosed with osteoarthritis she takes over-the-counter medications for it and says is worse in her knees and her hands  Patient has a rash in her groin that is yeast and she would like something to help treat with this.  Relevant past medical, surgical, family and social history reviewed and updated as indicated. Interim medical history since our last visit  reviewed. Allergies and medications reviewed and updated.  Review of Systems  Constitutional: Negative for chills and fever.  HENT: Negative for congestion, ear discharge, ear pain and tinnitus.   Eyes: Negative for pain, redness and visual disturbance.  Respiratory: Negative for cough, chest tightness, shortness of breath and wheezing.   Cardiovascular: Negative for chest pain, palpitations and leg swelling.  Gastrointestinal: Negative for abdominal pain, blood in stool, constipation and diarrhea.  Genitourinary: Negative for difficulty urinating, dysuria and hematuria.  Musculoskeletal: Positive for arthralgias. Negative for back pain, gait problem and myalgias.  Skin: Positive for rash.  Neurological: Negative for dizziness, weakness, light-headedness and headaches.  Psychiatric/Behavioral: Negative for agitation, behavioral problems and suicidal ideas.  All other systems reviewed and are negative.   Per HPI unless specifically indicated above  Social History   Socioeconomic History   Marital status: Widowed    Spouse name: Not on file   Number of children: 2   Years of education: Not on file   Highest education level: Not on file  Occupational History   Not on file  Social Needs   Financial resource strain: Not on file   Food insecurity    Worry: Not on file    Inability: Not on file   Transportation needs    Medical: Not on file    Non-medical: Not on file  Tobacco Use   Smoking status:  Never Smoker   Smokeless tobacco: Never Used  Substance and Sexual Activity   Alcohol use: No   Drug use: No   Sexual activity: Not on file  Lifestyle   Physical activity    Days per week: Not on file    Minutes per session: Not on file   Stress: Not on file  Relationships   Social connections    Talks on phone: Not on file    Gets together: Not on file    Attends religious service: Not on file    Active member of club or organization: Not on file    Attends  meetings of clubs or organizations: Not on file    Relationship status: Not on file   Intimate partner violence    Fear of current or ex partner: Not on file    Emotionally abused: Not on file    Physically abused: Not on file    Forced sexual activity: Not on file  Other Topics Concern   Not on file  Social History Narrative   Not on file    Past Surgical History:  Procedure Laterality Date   Absess Off Intestine     cataract surgery Left 08-10-2011   cataract surgery Right 11-01-2011   CHOLECYSTECTOMY     1972   COLONOSCOPY N/A 02/06/2015   Procedure: COLONOSCOPY;  Surgeon: Rogene Houston, MD;  Location: [facility];  Service: Endoscopy;  Laterality: N/A;  1015   GALLBLADDER SURGERY     Rupture Lower Stomach   HERNIA REPAIR     Hysterectomy ---- unknown     1980   [place] Tumor     Left Breast Masectomy     1986   MASS EXCISION N/A 01/30/2018   Procedure: EXCISION MASS 3CM ON BACK AND 3CM ON ABDOMEN;  Surgeon: Virl Cagey, MD;  Location: [facility];  Service: General;  Laterality: N/A;   Right Ankle Surgery      Family History  Problem Relation Age of Onset   Cancer Mother    Coronary artery disease Mother    Heart disease Sister     Allergies as of 09/11/2018      Reactions   Macrolides And Ketolides    Bleeding thru bowels   Codeine Nausea And Vomiting   Sulfa Antibiotics Nausea And Vomiting      Medication List       Accurate as of September 11, 2018  2:36 PM. If you have any questions, ask your nurse or doctor.        STOP taking these medications   docusate sodium 100 MG capsule Commonly known as: Colace Stopped by: Worthy Rancher, MD   methocarbamol 500 MG tablet Commonly known as: ROBAXIN Stopped by: Fransisca Kaufmann Sharda Keddy, MD   potassium gluconate 595 (99 K) MG Tabs tablet Stopped by: Fransisca Kaufmann Khadijah Mastrianni, MD   traMADol 50 MG tablet Commonly known as: Ultram Stopped by: Worthy Rancher, MD     TAKE these medications    amLODipine 5 MG tablet Commonly known as: NORVASC Take 1 tablet by mouth daily. What changed: Another medication with the same name was removed. Continue taking this medication, and follow the directions you see here. Changed by: Fransisca Kaufmann Kalob Bergen, MD   aspirin 81 MG tablet Take 81 mg by mouth daily.   Benadryl Allergy 25 MG tablet Generic drug: diphenhydrAMINE Take 25 mg by mouth daily as needed (allergies).   CALCIUM-MAGNESIUM-ZINC-D3 PO Take 1 tablet by mouth daily.  ibuprofen 200 MG tablet Commonly known as: ADVIL Take 800 mg by mouth 2 (two) times daily.   losartan-hydrochlorothiazide 100-25 MG tablet Commonly known as: HYZAAR Take 1 tablet by mouth daily.   multivitamin tablet Take 1 tablet by mouth daily.   nystatin cream Commonly known as: MYCOSTATIN Apply 1 application topically daily. What changed: Another medication with the same name was removed. Continue taking this medication, and follow the directions you see here. Changed by: Fransisca Kaufmann Raahil Ong, MD   Potassium 99 MG Tabs Take by mouth.   SYSTANE OP Place 1 drop into both eyes daily as needed (dry eyes).   vitamin C 1000 MG tablet Take 1,000 mg by mouth daily.   Vitamin D-3 25 MCG (1000 UT) Caps Take 2,000 Units by mouth daily.   zinc gluconate 50 MG tablet Take 50 mg by mouth daily.          Objective:    BP (!) 166/83    Pulse 81    Temp 98.2 F (36.8 C) (Temporal)    Ht 5\' 5"  (1.651 m)    Wt 223 lb (101.2 kg)    BMI 37.11 kg/m   Wt Readings from Last 3 Encounters:  09/11/18 223 lb (101.2 kg)  02/28/18 223 lb (101.2 kg)  02/14/18 223 lb 9.6 oz (101.4 kg)    Physical Exam Vitals signs and nursing note reviewed.  Constitutional:      General: She is not in acute distress.    Appearance: She is well-developed. She is not diaphoretic.  Eyes:     Conjunctiva/sclera: Conjunctivae normal.  Cardiovascular:     Rate and Rhythm: Normal rate and regular rhythm.     Heart sounds: Normal  heart sounds. No murmur.  Pulmonary:     Effort: Pulmonary effort is normal. No respiratory distress.     Breath sounds: Normal breath sounds. No wheezing.  Musculoskeletal: Normal range of motion.        General: No tenderness.  Skin:    General: Skin is warm and dry.     Findings: Rash (Patient has a small raised papule that is mostly skin colored on her lower eyelid and then a smaller raised papule on her left upper eyelid as well.) present.  Neurological:     Mental Status: She is alert and oriented to person, place, and time.     Coordination: Coordination normal.  Psychiatric:        Behavior: Behavior normal.        Assessment & Plan:   Problem List Items Addressed This Visit      Cardiovascular and Mediastinum   Essential hypertension, benign - Primary   Relevant Medications   Potassium 99 MG TABS   amLODipine (NORVASC) 5 MG tablet   losartan-hydrochlorothiazide (HYZAAR) 100-25 MG tablet     Digestive   GERD (gastroesophageal reflux disease)     Musculoskeletal and Integument   Osteoarthritis     Other   Severe obesity (BMI 35.0-39.9) with comorbidity ([place])    Other Visit Diagnoses    Hordeolum externum left lower eyelid       Relevant Medications   ciprofloxacin (CILOXAN) 0.3 % ophthalmic solution   Yeast dermatitis       Relevant Medications   fluconazole (DIFLUCAN) 150 MG tablet     Continue losartan hydrochlorothiazide and amlodipine and her potassium, will give Cipro drops for her eyes  Will give a dose of Diflucan to help with the rash of yeast dermatitis  Follow  up plan: Return in about 3 months (around 12/12/2018), or if symptoms worsen or fail to improve, for physical and bloodwork.  Caryl Pina, MD [facility] 09/11/2018, 2:36 PM

## 2018-10-03 ENCOUNTER — Encounter: Payer: Self-pay | Admitting: Orthopaedic Surgery

## 2018-10-03 ENCOUNTER — Other Ambulatory Visit: Payer: Self-pay

## 2018-10-03 ENCOUNTER — Ambulatory Visit (INDEPENDENT_AMBULATORY_CARE_PROVIDER_SITE_OTHER): Payer: Medicare Other | Admitting: Orthopaedic Surgery

## 2018-10-03 DIAGNOSIS — M7061 Trochanteric bursitis, right hip: Secondary | ICD-10-CM | POA: Diagnosis not present

## 2018-10-03 NOTE — Progress Notes (Signed)
PROCEDURE NOTE:  The patient request injection, verbal consent was obtained.  The right trochanteric area of the hip was prepped appropriately after time out was performed.   Sterile technique was observed and injection of 1 cc of Depo-Medrol 40 mg with several cc's of plain xylocaine. Anesthesia was provided by ethyl chloride and a 20-gauge needle was used to inject the hip area. The injection was tolerated well.  A band aid dressing was applied.  The patient was advised to apply ice later today and tomorrow to the injection sight as needed.  Call if any problem.  Precautions discussed.   Electronically Signed Sanjuana Kava, MD 9/15/202010:59 AM

## 2018-10-31 ENCOUNTER — Other Ambulatory Visit: Payer: Self-pay

## 2018-10-31 ENCOUNTER — Ambulatory Visit (INDEPENDENT_AMBULATORY_CARE_PROVIDER_SITE_OTHER): Payer: Medicare Other | Admitting: Family

## 2018-10-31 ENCOUNTER — Encounter: Payer: Self-pay | Admitting: Orthopaedic Surgery

## 2018-10-31 ENCOUNTER — Encounter: Payer: Self-pay | Admitting: Family

## 2018-10-31 ENCOUNTER — Ambulatory Visit: Payer: Medicare Other | Admitting: Orthopaedic Surgery

## 2018-10-31 VITALS — BP 162/85 | HR 70 | Temp 98.2°F | Ht 65.0 in | Wt 228.2 lb

## 2018-10-31 VITALS — BP 172/74 | HR 70 | Ht 65.0 in | Wt 223.0 lb

## 2018-10-31 DIAGNOSIS — L900 Lichen sclerosus et atrophicus: Secondary | ICD-10-CM | POA: Diagnosis not present

## 2018-10-31 DIAGNOSIS — M7061 Trochanteric bursitis, right hip: Secondary | ICD-10-CM | POA: Diagnosis not present

## 2018-10-31 DIAGNOSIS — B373 Candidiasis of vulva and vagina: Secondary | ICD-10-CM | POA: Diagnosis not present

## 2018-10-31 DIAGNOSIS — N898 Other specified noninflammatory disorders of vagina: Secondary | ICD-10-CM | POA: Diagnosis not present

## 2018-10-31 DIAGNOSIS — B3731 Acute candidiasis of vulva and vagina: Secondary | ICD-10-CM

## 2018-10-31 LAB — WET PREP FOR TRICH, YEAST, CLUE
Clue Cell Exam: NEGATIVE
Trichomonas Exam: NEGATIVE
Yeast Exam: POSITIVE — AB

## 2018-10-31 MED ORDER — FLUCONAZOLE 150 MG PO TABS: 150.0000 mg | ORAL_TABLET | ORAL | 0 refills | Status: DC | PRN

## 2018-10-31 MED ORDER — CLOBETASOL PROPIONATE 0.05 % EX CREA: 1.0000 "application " | TOPICAL_CREAM | Freq: Two times a day (BID) | CUTANEOUS | 0 refills | Status: DC

## 2018-10-31 MED ORDER — PREDNISONE 5 MG (21) PO TBPK: ORAL_TABLET | ORAL | 0 refills | Status: DC

## 2018-10-31 NOTE — Patient Instructions (Signed)
Lichen Sclerosus Lichen sclerosus is a skin problem. It can happen on any part of the body, but it commonly involves the anal or genital areas. It can cause itching and discomfort in these areas. Treatment can help to control symptoms. When the genital area is affected, getting treatment is important because the condition can cause scarring that may lead to other problems. What are the causes? The cause of this condition is not known. It may be related to an overactive immune system or a lack of certain hormones. Lichen sclerosus is not an infection or a fungus, and it is not passed from one person to another (not contagious). What increases the risk? This condition is more likely to develop in women, usually after menopause. What are the signs or symptoms? Symptoms of this condition include:  Thin, wrinkled, white areas on the skin.  Thickened white areas on the skin.  Red and swollen patches (lesions) on the skin.  Tears or cracks in the skin.  Bruising.  Blood blisters.  Severe itching.  Pain, itching, or burning when urinating. Constipation is also common in people with lichen sclerosus. How is this diagnosed? This condition may be diagnosed with a physical exam. In some cases, a tissue sample (biopsy sample) may be removed to be looked at under a microscope. How is this treated? This condition is usually treated with medicated creams or ointments (topical steroids) that are applied over the affected areas. In some cases, treatment may also include medicines that are taken by mouth. Surgery may be needed in more severe cases that are causing problems such as scarring. Follow these instructions at home:  Take or use over-the-counter and prescription medicines only as told by your health care provider.  Use creams or ointments as told by your health care provider.  Do not scratch the affected areas of skin.  If you are a woman, be sure to keep the vaginal area as clean and dry  as possible.  Clean the affected area of skin gently with water. Avoid using rough towels or toilet paper.  Keep all follow-up visits as told by your health care provider. This is important. Contact a health care provider if:  You have increasing redness, swelling, or pain in the affected area.  You have fluid, blood, or pus coming from the affected area.  You have new lesions on your skin.  You have a fever.  You have pain during sex. Summary  Lichen sclerosus is a skin problem. When the genital area is affected, getting treatment is important because the condition can cause scarring that may lead to other problems.  This condition is usually treated with medicated creams or ointments (topical steroids) that are applied over the affected areas.  Take or use over-the-counter and prescription medicines only as told by your health care provider.  Contact a health care provider if you have new lesions on your skin, have pain during sex, or have increasing redness, swelling, or pain in the affected area.  Keep all follow-up visits as told by your health care provider. This is important. This information is not intended to replace advice given to you by your health care provider. Make sure you discuss any questions you have with your health care provider. Document Released: 05/27/2010 Document Revised: 05/19/2017 Document Reviewed: 05/19/2017 Elsevier Patient Education  2020 [employer]  

## 2018-10-31 NOTE — Progress Notes (Signed)
Patient [id number] Anna Bradford, female DOB:January 01, 1934, 85 y.o. [id number]  Chief Complaint  Patient presents with  . Hip Pain    states injection only lasted two days, then pain moved into low back, and is in buttock     HPI  Anna Bradford is a 85 y.o. female who has right hip bursitis.  The injection helped only about three days last time.  I will give prednisone by mouth now.  She has tenderness of the right hip but less than last time. She has no redness or limp today.     Body mass index is 37.11 kg/m.  ROS  Review of Systems  HENT: Negative for congestion.   Respiratory: Negative for cough and shortness of breath.   Cardiovascular: Negative for chest pain and leg swelling.  Endocrine: Positive for cold intolerance.  Musculoskeletal: Positive for arthralgias, back pain, gait problem and joint swelling.  Allergic/Immunologic: Positive for environmental allergies.    All other systems reviewed and are negative.  The following is a summary of the past history medically, past history surgically, known current medicines, social history and family history.  This information is gathered electronically by the computer from prior information and documentation.  I review this each visit and have found including this information at this point in the chart is beneficial and informative.    Past Medical History:  Diagnosis Date  . Arthritis   . Cancer [facility]) 1996   left breast  . Essential hypertension, benign   . GERD (gastroesophageal reflux disease)     Past Surgical History:  Procedure Laterality Date  . Absess Off Intestine    . cataract surgery Left 08-10-2011  . cataract surgery Right 11-01-2011  . CHOLECYSTECTOMY     1972  . COLONOSCOPY Anna/A 02/06/2015   Procedure: COLONOSCOPY;  Surgeon: Rogene Houston, MD;  Location: [facility];  Service: Endoscopy;  Laterality: Anna/A;  1015  . GALLBLADDER SURGERY     Rupture Lower Stomach  . HERNIA REPAIR    . Hysterectomy ---- unknown      1980  . [place] Tumor    . Left Breast Masectomy     1986  . MASS EXCISION Anna/A 01/30/2018   Procedure: EXCISION MASS 3CM ON BACK AND 3CM ON ABDOMEN;  Surgeon: Virl Cagey, MD;  Location: [facility] ORS;  Service: General;  Laterality: Anna/A;  . Right Ankle Surgery      Family History  Problem Relation Age of Onset  . Cancer Mother   . Coronary artery disease Mother   . Heart disease Sister     Social History Social History   Tobacco Use  . Smoking status: Never Smoker  . Smokeless tobacco: Never Used  Substance Use Topics  . Alcohol use: No  . Drug use: No    Allergies  Allergen Reactions  . Macrolides And Ketolides     Bleeding thru bowels  . Codeine Nausea And Vomiting  . Sulfa Antibiotics Nausea And Vomiting    Current Outpatient Medications  Medication Sig Dispense Refill  . amLODipine (NORVASC) 5 MG tablet Take 1 tablet (5 mg total) by mouth daily. 90 tablet 3  . Ascorbic Acid (VITAMIN C) 1000 MG tablet Take 1,000 mg by mouth daily.     Marland Kitchen aspirin 81 MG tablet Take 81 mg by mouth daily.    . Cholecalciferol (VITAMIN D-3) 1000 UNITS CAPS Take 2,000 Units by mouth daily.    . diphenhydrAMINE (BENADRYL ALLERGY) 25 MG tablet Take 25  mg by mouth daily as needed (allergies).     . fluconazole (DIFLUCAN) 150 MG tablet Take 1 tablet (150 mg total) by mouth once a week. 2 tablet 0  . ibuprofen (ADVIL,MOTRIN) 200 MG tablet Take 800 mg by mouth 2 (two) times daily.     Marland Kitchen losartan-hydrochlorothiazide (HYZAAR) 100-25 MG tablet Take 1 tablet by mouth daily. 90 tablet 3  . Multiple Minerals-Vitamins (CALCIUM-MAGNESIUM-ZINC-D3 PO) Take 1 tablet by mouth daily.    . Multiple Vitamin (MULTIVITAMIN) tablet Take 1 tablet by mouth daily.    Marland Kitchen nystatin cream (MYCOSTATIN) Apply 1 application topically daily.    Vladimir Faster Glycol-Propyl Glycol (SYSTANE OP) Place 1 drop into both eyes daily as needed (dry eyes).    . Potassium 99 MG TABS Take by mouth.    . zinc gluconate 50 MG tablet  Take 50 mg by mouth daily.    . predniSONE (STERAPRED UNI-PAK 21 TAB) 5 MG (21) TBPK tablet Take 6 pills first day; 5 pills second day; 4 pills third day; 3 pills fourth day; 2 pills next day and 1 pill last day. 21 tablet 0   No current facility-administered medications for this visit.      Physical Exam  Blood pressure (!) 172/74, pulse 70, height 5\' 5"  (1.651 m), weight 223 lb (101.2 kg).  Constitutional: overall normal hygiene, normal nutrition, well developed, normal grooming, normal body habitus. Assistive device:none  Musculoskeletal: gait and station Limp none, muscle tone and strength are normal, no tremors or atrophy is present.  .  Neurological: coordination overall normal.  Deep tendon reflex/nerve stretch intact.  Sensation normal.  Cranial nerves II-XII intact.   Skin:   Normal overall no scars, lesions, ulcers or rashes. No psoriasis.  Psychiatric: Alert and oriented x 3.  Recent memory intact, remote memory unclear.  Normal mood and affect. Well groomed.  Good eye contact.  Cardiovascular: overall no swelling, no varicosities, no edema bilaterally, normal temperatures of the legs and arms, no clubbing, cyanosis and good capillary refill.  Lymphatic: palpation is normal.  She is tender over the right lateral hip area, ROM is full.  NV intact.  All other systems reviewed and are negative   The patient has been educated about the nature of the problem(s) and counseled on treatment options.  The patient appeared to understand what I have discussed and is in agreement with it.  Encounter Diagnosis  Name Primary?  . Greater trochanteric bursitis of right hip Yes    PLAN Call if any problems.  Precautions discussed.  Continue current medications. Begin prednisone dose pack.   Return to clinic 2 weeks   Electronically Signed Sanjuana Kava, MD 10/13/202010:14 AM

## 2018-10-31 NOTE — Progress Notes (Signed)
Subjective:    Patient ID: Anna Bradford, female    DOB: 10-19-1933, 85 y.o.   MRN: [id number]  Chief Complaint  Patient presents with  . Vaginal Itching   Pt presents to the office today with intense vaginal itching that started 12/2017. She has tried diflucan and fungal creams with no relief.  Vaginal Itching The patient's primary symptoms include genital itching and a genital rash. The patient's pertinent negatives include no genital odor, vaginal bleeding or vaginal discharge. This is a new problem. The current episode started more than 1 month ago. The problem occurs intermittently. The problem has been unchanged. The patient is experiencing no pain. Pertinent negatives include no constipation, diarrhea, flank pain, frequency, rash or sore throat. She has tried antifungals (difluican) for the symptoms. The treatment provided mild relief.      Review of Systems  HENT: Negative for sore throat.   Gastrointestinal: Negative for constipation and diarrhea.  Genitourinary: Negative for flank pain, frequency and vaginal discharge.  Skin: Negative for rash.  All other systems reviewed and are negative.      Objective:   Physical Exam Vitals signs reviewed.  Constitutional:      General: She is not in acute distress.    Appearance: She is well-developed.  HENT:     Head: Normocephalic and atraumatic.  Eyes:     Pupils: Pupils are equal, round, and reactive to light.  Neck:     Musculoskeletal: Normal range of motion and neck supple.     Thyroid: No thyromegaly.  Cardiovascular:     Rate and Rhythm: Normal rate and regular rhythm.     Heart sounds: Normal heart sounds. No murmur.  Pulmonary:     Effort: Pulmonary effort is normal. No respiratory distress.     Breath sounds: Normal breath sounds. No wheezing.  Abdominal:     General: Bowel sounds are normal. There is no distension.     Palpations: Abdomen is soft.     Tenderness: There is no abdominal tenderness.   Genitourinary:    Comments: Labia fussed together, labia erythemas  Musculoskeletal: Normal range of motion.        General: No tenderness.  Skin:    General: Skin is warm and dry.  Neurological:     Mental Status: She is alert and oriented to person, place, and time.     Cranial Nerves: No cranial nerve deficit.     Deep Tendon Reflexes: Reflexes are normal and symmetric.  Psychiatric:        Behavior: Behavior normal.        Thought Content: Thought content normal.        Judgment: Judgment normal.       BP (!) 162/85   Pulse 70   Temp 98.2 F (36.8 C) (Temporal)   Ht 5\' 5"  (1.651 m)   Wt 228 lb 3.2 oz (103.5 kg)   SpO2 97%   BMI 37.97 kg/m      Assessment & Plan:  Anna Bradford comes in today with chief complaint of Vaginal Itching   Diagnosis and orders addressed:  1. Vaginal itching - WET PREP FOR TRICH, YEAST, CLUE - clobetasol cream (TEMOVATE) 0.05 %; Apply 1 application topically 2 (two) times daily.  Dispense: 30 g; Refill: 0  2. Lichen sclerosus Do not scratch Apply Clobetasol BID until itching improves, then decrease to TID prn - clobetasol cream (TEMOVATE) 0.05 %; Apply 1 application topically 2 (two) times daily.  Dispense: 30  g; Refill: 0  3. Vagina, candidiasis Keep clean and dry Cotton underwear - fluconazole (DIFLUCAN) 150 MG tablet; Take 1 tablet (150 mg total) by mouth every three (3) days as needed.  Dispense: 4 tablet; Refill: 0     Anna Dun, FNP

## 2018-11-07 ENCOUNTER — Ambulatory Visit (INDEPENDENT_AMBULATORY_CARE_PROVIDER_SITE_OTHER): Payer: Medicare Other | Admitting: *Deleted

## 2018-11-07 DIAGNOSIS — Z0000 Encounter for general adult medical examination without abnormal findings: Secondary | ICD-10-CM

## 2018-11-07 NOTE — Patient Instructions (Signed)
Preventive Care 83 Years and Older, Female Preventive care refers to lifestyle choices and visits with your health care provider that can promote health and wellness. This includes:  A yearly physical exam. This is also called an annual well check.  Regular dental and eye exams.  Immunizations.  Screening for certain conditions.  Healthy lifestyle choices, such as diet and exercise. What can I expect for my preventive care visit? Physical exam Your health care provider will check:  Height and weight. These may be used to calculate body mass index (BMI), which is a measurement that tells if you are at a healthy weight.  Heart rate and blood pressure.  Your skin for abnormal spots. Counseling Your health care provider may ask you questions about:  Alcohol, tobacco, and drug use.  Emotional well-being.  Home and relationship well-being.  Sexual activity.  Eating habits.  History of falls.  Memory and ability to understand (cognition).  Work and work Statistician.  Pregnancy and menstrual history. What immunizations do I need?  Influenza (flu) vaccine  This is recommended every year. Tetanus, diphtheria, and pertussis (Tdap) vaccine  You may need a Td booster every 10 years. Varicella (chickenpox) vaccine  You may need this vaccine if you have not already been vaccinated. Zoster (shingles) vaccine  You may need this after age 33. Pneumococcal conjugate (PCV13) vaccine  One dose is recommended after age 33. Pneumococcal polysaccharide (PPSV23) vaccine  One dose is recommended after age 72. Measles, mumps, and rubella (MMR) vaccine  You may need at least one dose of MMR if you were born in 1957 or later. You may also need a second dose. Meningococcal conjugate (MenACWY) vaccine  You may need this if you have certain conditions. Hepatitis A vaccine  You may need this if you have certain conditions or if you travel or work in places where you may be exposed  to hepatitis A. Hepatitis B vaccine  You may need this if you have certain conditions or if you travel or work in places where you may be exposed to hepatitis B. Haemophilus influenzae type b (Hib) vaccine  You may need this if you have certain conditions. You may receive vaccines as individual doses or as more than one vaccine together in one shot (combination vaccines). Talk with your health care provider about the risks and benefits of combination vaccines. What tests do I need? Blood tests  Lipid and cholesterol levels. These may be checked every 5 years, or more frequently depending on your overall health.  Hepatitis C test.  Hepatitis B test. Screening  Lung cancer screening. You may have this screening every year starting at age 39 if you have a 30-pack-year history of smoking and currently smoke or have quit within the past 15 years.  Colorectal cancer screening. All adults should have this screening starting at age 36 and continuing until age 15. Your health care provider may recommend screening at age 23 if you are at increased risk. You will have tests every 1-10 years, depending on your results and the type of screening test.  Diabetes screening. This is done by checking your blood sugar (glucose) after you have not eaten for a while (fasting). You may have this done every 1-3 years.  Mammogram. This may be done every 1-2 years. Talk with your health care provider about how often you should have regular mammograms.  BRCA-related cancer screening. This may be done if you have a family history of breast, ovarian, tubal, or peritoneal cancers.  Other tests  Sexually transmitted disease (STD) testing.  Bone density scan. This is done to screen for osteoporosis. You may have this done starting at age 76. Follow these instructions at home: Eating and drinking  Eat a diet that includes fresh fruits and vegetables, whole grains, lean protein, and low-fat dairy products. Limit  your intake of foods with high amounts of sugar, saturated fats, and salt.  Take vitamin and mineral supplements as recommended by your health care provider.  Do not drink alcohol if your health care provider tells you not to drink.  If you drink alcohol: ? Limit how much you have to 0-1 drink a day. ? Be aware of how much alcohol is in your drink. In the [place] one drink equals one 12 oz bottle of beer (355 mL), one 5 oz glass of wine (148 mL), or one 1 oz glass of hard liquor (44 mL). Lifestyle  Take daily care of your teeth and gums.  Stay active. Exercise for at least 30 minutes on 5 or more days each week.  Do not use any products that contain nicotine or tobacco, such as cigarettes, e-cigarettes, and chewing tobacco. If you need help quitting, ask your health care provider.  If you are sexually active, practice safe sex. Use a condom or other form of protection in order to prevent STIs (sexually transmitted infections).  Talk with your health care provider about taking a low-dose aspirin or statin. What's next?  Go to your health care provider once a year for a well check visit.  Ask your health care provider how often you should have your eyes and teeth checked.  Stay up to date on all vaccines. This information is not intended to replace advice given to you by your health care provider. Make sure you discuss any questions you have with your health care provider. Document Released: 01/31/2015 Document Revised: 12/29/2017 Document Reviewed: 12/29/2017 Elsevier Patient Education  2020 [employer].

## 2018-11-07 NOTE — Progress Notes (Addendum)
MEDICARE ANNUAL WELLNESS VISIT  11/07/2018  Telephone Visit Disclaimer This Medicare AWV was conducted by telephone due to national recommendations for restrictions regarding the COVID-19 Pandemic (e.g. social distancing).  I verified, using two identifiers, that I am speaking with Anna Bradford or their authorized healthcare agent. I discussed the limitations, risks, security, and privacy concerns of performing an evaluation and management service by telephone and the potential availability of an in-person appointment in the future. The patient expressed understanding and agreed to proceed.   Subjective:  Anna Bradford is a 85 y.o. female patient of Dettinger, Fransisca Kaufmann, MD who had a Medicare Annual Wellness Visit today via telephone. Anna Bradford is Retired and lives alone. she has 2 children. 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 she is minimally physically active and enjoys painting, gardening, canning, knitting and cooking.  Patient Care Team: Dettinger, Fransisca Kaufmann, MD as PCP - General (Family Medicine)  Advanced Directives 11/07/2018 01/30/2018 01/25/2018 02/06/2015  Does Patient Have a Medical Advance Directive? No No No No  Would patient like information on creating a medical advance directive? No - Patient declined No - Patient declined No - Patient declined No - patient declined information    Hospital Utilization Over the Past 12 Months: # of hospitalizations or ER visits: 0 # of surgeries: 0  Review of Systems    Patient reports that her overall health is better compared to last year.  History obtained from chart review  Patient Reported Readings (BP, Pulse, CBG, Weight, etc) none  Pain Assessment Pain : No/denies pain     Current Medications & Allergies (verified) Allergies as of 11/07/2018      Reactions   Macrolides And Ketolides    Bleeding thru bowels   Codeine Nausea And Vomiting   Sulfa Antibiotics Nausea And Vomiting      Medication List       Accurate as of November 07, 2018  1:58 PM. If you have any questions, ask your nurse or doctor.        amLODipine 5 MG tablet Commonly known as: NORVASC Take 1 tablet (5 mg total) by mouth daily.   aspirin 81 MG tablet Take 81 mg by mouth daily.   Benadryl Allergy 25 MG tablet Generic drug: diphenhydrAMINE Take 25 mg by mouth daily as needed (allergies).   CALCIUM-MAGNESIUM-ZINC-D3 PO Take 1 tablet by mouth daily.   clobetasol cream 0.05 % Commonly known as: TEMOVATE Apply 1 application topically 2 (two) times daily.   fluconazole 150 MG tablet Commonly known as: DIFLUCAN Take 1 tablet (150 mg total) by mouth every three (3) days as needed.   ibuprofen 200 MG tablet Commonly known as: ADVIL Take 800 mg by mouth 2 (two) times daily.   losartan-hydrochlorothiazide 100-25 MG tablet Commonly known as: HYZAAR Take 1 tablet by mouth daily.   magnesium hydroxide 400 MG/5ML suspension Commonly known as: MILK OF MAGNESIA Take by mouth as needed for mild constipation.   multivitamin tablet Take 1 tablet by mouth daily.   nystatin cream Commonly known as: MYCOSTATIN Apply 1 application topically daily.   Potassium 99 MG Tabs Take by mouth.   SYSTANE OP Place 1 drop into both eyes daily as needed (dry eyes).   vitamin C 1000 MG tablet Take 1,000 mg by mouth daily.   Vitamin D-3  25 MCG (1000 UT) Caps Take 2,000 Units by mouth daily.   zinc gluconate 50 MG tablet Take 50 mg by mouth daily.       History (reviewed): Past Medical History:  Diagnosis Date  . Arthritis   . Cancer [facility]) 1996   left breast  . Essential hypertension, benign   . GERD (gastroesophageal reflux disease)    Past Surgical History:  Procedure Laterality Date  . ABDOMINAL HYSTERECTOMY    . Absess Off Intestine    . cataract surgery  Left 08-10-2011  . cataract surgery Right 11-01-2011  . CHOLECYSTECTOMY     1972  . COLONOSCOPY N/A 02/06/2015   Procedure: COLONOSCOPY;  Surgeon: Rogene Houston, MD;  Location: [facility] ENDO SUITE;  Service: Endoscopy;  Laterality: N/A;  1015  . GALLBLADDER SURGERY     Rupture Lower Stomach  . HERNIA REPAIR    . Hysterectomy ---- unknown     1980  . [place] Tumor    . Left Breast Masectomy     1986  . MASS EXCISION N/A 01/30/2018   Procedure: EXCISION MASS 3CM ON BACK AND 3CM ON ABDOMEN;  Surgeon: Virl Cagey, MD;  Location: [facility] ORS;  Service: General;  Laterality: N/A;  . Right Ankle Surgery     Family History  Problem Relation Age of Onset  . Cancer Mother   . Coronary artery disease Mother   . Heart disease Sister    Social History   Socioeconomic History  . Marital status: Widowed    Spouse name: Not on file  . Number of children: 2  . Years of education: 12-failed the 4th grade and had to repeat  . Highest education level: 11th grade  Occupational History  . Occupation: retired  Scientific laboratory technician  . Financial resource strain: Not very hard  . Food insecurity    Worry: Never true    Inability: Never true  . Transportation needs    Medical: No    Non-medical: No  Tobacco Use  . Smoking status: Never Smoker  . Smokeless tobacco: Never Used  Substance and Sexual Activity  . Alcohol use: No  . Drug use: No  . Sexual activity: Not Currently    Birth control/protection: Surgical  Lifestyle  . Physical activity    Days per week: 0 days    Minutes per session: 0 min  . Stress: Only a little  Relationships  . Social connections    Talks on phone: More than three times a week    Gets together: More than three times a week    Attends religious service: More than 4 times per year    Active member of club or organization: Yes    Attends meetings of clubs or organizations: More than 4 times per year    Relationship status: Widowed  Other Topics Concern  . Not on file   Social History Narrative  . Not on file    Activities of Daily Living In your present state of health, do you have any difficulty performing the following activities: 11/07/2018 01/30/2018  Hearing? N N  Vision? Y N  Comment some blurred vision-wears glasses-usually only goes every 3 years to the eye doctor but did go this year -  Difficulty concentrating or making decisions? N N  Walking or climbing stairs? N N  Dressing or bathing? N N  Doing errands, shopping? N -  Preparing Food and eating ? N -  Using the Toilet? N -  In the  past six months, have you accidently leaked urine? Y -  Comment wears a pad if she is going to be out of the house for an extended amount of time -  Do you have problems with loss of bowel control? N -  Managing your Medications? N -  Managing your Finances? N -  Housekeeping or managing your Housekeeping? N -  Some recent data might be hidden    Patient Education/ Literacy How often do you need to have someone help you when you read instructions, pamphlets, or other written materials from your doctor or pharmacy?: 1 - Never What is the last grade level you completed in school?: 11th grade  Exercise Current Exercise Habits: The patient does not participate in regular exercise at present, Exercise limited by: orthopedic condition(s)  Diet Patient reports consuming 3 meals a day and 2 snack(s) a day Patient reports that her primary diet is: Regular Patient reports that she does have regular access to food.   Depression Screen PHQ 2/9 Scores 11/07/2018 10/31/2018 09/11/2018  PHQ - 2 Score 0 0 0     Fall Risk Fall Risk  11/07/2018 10/31/2018 10/31/2018 09/11/2018  Falls in the past year? 1 0 0 0  Number falls in past yr: 0 - - -  Injury with Fall? 0 - - -  Follow up Falls prevention discussed - - -  Comment Get rid of all throw rugs in the house, adequate lighting in the walkways and grab bars in the bathroom - - -     Objective:  Anna Bradford  seemed alert and oriented and she participated appropriately during our telephone visit.  Blood Pressure Weight BMI  BP Readings from Last 3 Encounters:  10/31/18 (!) 162/85  10/31/18 (!) 172/74  09/11/18 (!) 166/83   Wt Readings from Last 3 Encounters:  10/31/18 228 lb 3.2 oz (103.5 kg)  10/31/18 223 lb (101.2 kg)  09/11/18 223 lb (101.2 kg)   BMI Readings from Last 1 Encounters:  10/31/18 37.97 kg/m    *Unable to obtain current vital signs, weight, and BMI due to telephone visit type  Hearing/Vision  . Dasiah did not seem to have difficulty with hearing/understanding during the telephone conversation . Reports that she has had a formal eye exam by an eye care professional within the past year . Reports that she has not had a formal hearing evaluation within the past year *Unable to fully assess hearing and vision during telephone visit type  Cognitive Function: 6CIT Screen 11/07/2018  What Year? 0 points  What month? 0 points  What time? 0 points  Count back from 20 0 points  Months in reverse 0 points  Repeat phrase 0 points  Total Score 0   (Normal:0-7, Significant for Dysfunction: >8)  Normal Cognitive Function Screening: Yes   Immunization & Health Maintenance Record Immunization History  Administered Date(s) Administered  . Influenza Split 11/09/2016  . Influenza, High Dose Seasonal PF 12/03/2015, 11/28/2017  . Influenza, Seasonal, Injecte, Preservative Fre 10/23/2014  . Influenza,trivalent, recombinat, inj, PF 10/22/2016  . Pneumococcal Conjugate-13 12/16/2014  . Pneumococcal Polysaccharide-23 11/14/2004  . Tdap 07/03/2012    Health Maintenance  Topic Date Due  . DEXA SCAN  08/14/2000  . INFLUENZA VACCINE  06/08/2019 (Originally 08/19/2018)  . TETANUS/TDAP  07/04/2022  . PNA vac Low Risk Adult  Completed       Assessment  This is a routine wellness examination for Anna Bradford.  Health Maintenance: Due or [place]  Maintenance Due  Topic Date  Due  . DEXA SCAN  08/14/2000    Anna Bradford does not need a referral for Community Assistance: Care Management:   no Social Work:    no Prescription Assistance:  no Nutrition/Diabetes Education:  no   Plan:  Personalized Goals Goals Addressed            This Visit's Progress   . DIET - INCREASE WATER INTAKE       Try to drink 6-8 glasses of water daily.      Personalized Health Maintenance & Screening Recommendations  Influenza vaccine Bone densitometry screening Shingles vaccine  Lung Cancer Screening Recommended: no (Low Dose CT Chest recommended if Age 85-80 years, 30 pack-year currently smoking OR have quit w/in past 15 years) Hepatitis C Screening recommended: no HIV Screening recommended: no  Advanced Directives: Written information was not prepared per patient's request.  Referrals & Orders No orders of the defined types were placed in this encounter.   Follow-up Plan . Follow-up with Dettinger, Fransisca Kaufmann, MD as planned . Schedule your DEXA scan as discussed . Consider Flu and Shingles vaccines at your next visit with your PCP   I have personally reviewed and noted the following in the patient's chart:   . Medical and social history . Use of alcohol, tobacco or illicit drugs  . Current medications and supplements . Functional ability and status . Nutritional status . Physical activity . Advanced directives . List of other physicians . Hospitalizations, surgeries, and ER visits in previous 12 months . Vitals . Screenings to include cognitive, depression, and falls . Referrals and appointments  In addition, I have reviewed and discussed with Anna Bradford certain preventive protocols, quality metrics, and best practice recommendations. A written personalized care plan for preventive services as well as general preventive health recommendations is available and can be mailed to the patient at her request.      [place], Donny Pique, LPN  [id number]     I have reviewed and agree with the above AWV documentation.   [name], FNP

## 2018-11-14 ENCOUNTER — Ambulatory Visit: Payer: Medicare Other | Admitting: Orthopaedic Surgery

## 2018-11-28 ENCOUNTER — Telehealth: Payer: Self-pay | Admitting: *Deleted

## 2018-11-28 DIAGNOSIS — I10 Essential (primary) hypertension: Secondary | ICD-10-CM

## 2018-11-28 MED ORDER — LOSARTAN POTASSIUM-HCTZ 100-25 MG PO TABS: 1.0000 | ORAL_TABLET | Freq: Every day | ORAL | 3 refills | Status: DC

## 2018-11-28 MED ORDER — AMLODIPINE BESYLATE 5 MG PO TABS: 5.0000 mg | ORAL_TABLET | Freq: Every day | ORAL | 3 refills | Status: DC

## 2018-11-28 NOTE — Telephone Encounter (Signed)
Pt left VM needing medications sent to mail order pharmacy Aware these have been sent

## 2018-12-13 ENCOUNTER — Encounter: Payer: Self-pay | Admitting: Family Medicine

## 2018-12-13 ENCOUNTER — Other Ambulatory Visit: Payer: Self-pay

## 2018-12-13 ENCOUNTER — Ambulatory Visit (INDEPENDENT_AMBULATORY_CARE_PROVIDER_SITE_OTHER): Payer: Medicare Other | Admitting: Family Medicine

## 2018-12-13 VITALS — BP 179/93 | HR 74 | Temp 98.0°F | Ht 65.0 in | Wt 224.2 lb

## 2018-12-13 DIAGNOSIS — K219 Gastro-esophageal reflux disease without esophagitis: Secondary | ICD-10-CM

## 2018-12-13 DIAGNOSIS — I10 Essential (primary) hypertension: Secondary | ICD-10-CM

## 2018-12-13 NOTE — Progress Notes (Signed)
BP (!) 179/93   Pulse 74   Temp 98 F (36.7 C)   Ht _0  (1.651 m)   Wt 224 lb 3.2 oz (101.7 kg)   SpO2 97%   BMI 37.31 kg/m    Subjective:   Patient ID: Anna Bradford, female    DOB: [birth year], 85 y.o.   MRN: [id number]  HPI: Anna Bradford is a 85 y.o. female presenting on 12/13/2018 for Hypertension (3 month follow up)   HPI Hypertension Patient is currently on lisinopril hydrochlorothiazide and amlodipine, and their blood pressure today is 179/93, she thinks it is running better at home, she says she gets checked once a week on Thursdays.. Patient denies any lightheadedness or dizziness. Patient denies headaches, blurred vision, chest pains, shortness of breath, or weakness. Denies any side effects from medication and is content with current medication.  Patient does say she is having some cramping and she is on a fluid pill, recommend to stay hydrated, she is taking magnesium but if it continues to worsen we may have to stop the hydrochlorothiazide.  GERD Patient is currently on no medication.  She denies any major symptoms or abdominal pain or belching or burping. She denies any blood in her stool or lightheadedness or dizziness.   Relevant past medical, surgical, family and social history reviewed and updated as indicated. Interim medical history since our last visit reviewed. Allergies and medications reviewed and updated.  Review of Systems  Constitutional: Negative for chills and fever.  Eyes: Negative for visual disturbance.  Respiratory: Negative for chest tightness and shortness of breath.   Cardiovascular: Negative for chest pain and leg swelling.  Musculoskeletal: Positive for myalgias. Negative for back pain and gait problem.  Skin: Negative for rash.  Neurological: Negative for light-headedness and headaches.  Psychiatric/Behavioral: Negative for agitation and behavioral problems.  All other systems reviewed and are negative.   Per HPI unless specifically  indicated above   Allergies as of 12/13/2018      Reactions   Macrolides And Ketolides    Bleeding thru bowels   Codeine Nausea And Vomiting   Sulfa Antibiotics Nausea And Vomiting      Medication List       Accurate as of December 13, 2018  3:24 PM. If you have any questions, ask your nurse or doctor.        STOP taking these medications   fluconazole 150 MG tablet Commonly known as: DIFLUCAN Stopped by: Fransisca Kaufmann Dettinger, MD     TAKE these medications   amLODipine 5 MG tablet Commonly known as: NORVASC Take 1 tablet (5 mg total) by mouth daily.   aspirin 81 MG tablet Take 81 mg by mouth daily.   Benadryl Allergy 25 MG tablet Generic drug: diphenhydrAMINE Take 25 mg by mouth daily as needed (allergies).   CALCIUM-MAGNESIUM-ZINC-D3 PO Take 1 tablet by mouth daily.   clobetasol cream 0.05 % Commonly known as: TEMOVATE Apply 1 application topically 2 (two) times daily.   ibuprofen 200 MG tablet Commonly known as: ADVIL Take 800 mg by mouth 2 (two) times daily.   losartan-hydrochlorothiazide 100-25 MG tablet Commonly known as: HYZAAR Take 1 tablet by mouth daily.   magnesium hydroxide 400 MG/5ML suspension Commonly known as: MILK OF MAGNESIA Take by mouth as needed for mild constipation.   multivitamin tablet Take 1 tablet by mouth daily.   nystatin cream Commonly known as: MYCOSTATIN Apply 1 application topically daily.   Potassium 99 MG Tabs Take by  mouth.   SYSTANE OP Place 1 drop into both eyes daily as needed (dry eyes).   vitamin C 1000 MG tablet Take 1,000 mg by mouth daily.   Vitamin D-3 25 MCG (1000 UT) Caps Take 2,000 Units by mouth daily.   zinc gluconate 50 MG tablet Take 50 mg by mouth daily.        Objective:   Pulse 74   Temp 98 F (36.7 C)   Ht _0  (1.651 m)   Wt 224 lb 3.2 oz (101.7 kg)   SpO2 97%   BMI 37.31 kg/m   Wt Readings from Last 3 Encounters:  12/13/18 224 lb 3.2 oz (101.7 kg)  10/31/18 228 lb 3.2  oz (103.5 kg)  10/31/18 223 lb (101.2 kg)    Physical Exam Vitals signs and nursing note reviewed.  Constitutional:      General: She is not in acute distress.    Appearance: She is well-developed. She is not diaphoretic.  Eyes:     Conjunctiva/sclera: Conjunctivae normal.  Cardiovascular:     Rate and Rhythm: Normal rate and regular rhythm.     Heart sounds: Normal heart sounds. No murmur.  Pulmonary:     Effort: Pulmonary effort is normal. No respiratory distress.     Breath sounds: Normal breath sounds. No wheezing.  Musculoskeletal: Normal range of motion.        General: No tenderness.  Skin:    General: Skin is warm and dry.     Findings: No rash.  Neurological:     Mental Status: She is alert and oriented to person, place, and time.     Coordination: Coordination normal.  Psychiatric:        Behavior: Behavior normal.       Assessment & Plan:   Problem List Items Addressed This Visit      Cardiovascular and Mediastinum   Essential hypertension, benign - Primary   Relevant Orders   CBC with Differential/Platelet   CMP14+EGFR   Lipid panel     Digestive   GERD (gastroesophageal reflux disease)   Relevant Orders   CBC with Differential/Platelet   CMP14+EGFR   Lipid panel     Other   Severe obesity (BMI 35.0-39.9) with comorbidity ([place])   Relevant Orders   CBC with Differential/Platelet   CMP14+EGFR   Lipid panel      Will continue current medication even though blood pressure is up at patient's request, discussed increasing the amlodipine and she was hesitant towards this at this point so we will hold off and she will keep a closer eye on her blood pressures and write down the numbers of bring them with her next time.  She should call me earlier if it still running up and we can increase the amlodipine to 10 mg. Follow up plan: Return in about 3 months (around 03/15/2019), or if symptoms worsen or fail to improve, for Hypertension recheck.  Counseling  provided for all of the vaccine components No orders of the defined types were placed in this encounter.   Caryl Pina, MD [facility] 12/13/2018, 3:24 PM

## 2018-12-14 LAB — CMP14+EGFR
ALT: 21 IU/L (ref 0–32)
AST: 17 IU/L (ref 0–40)
Albumin/Globulin Ratio: 1.7 (ref 1.2–2.2)
Albumin: 4 g/dL (ref 3.6–4.6)
Alkaline Phosphatase: 87 IU/L (ref 39–117)
BUN/Creatinine Ratio: 22 (ref 12–28)
BUN: 16 mg/dL (ref 8–27)
Bilirubin Total: 0.3 mg/dL (ref 0.0–1.2)
CO2: 28 mmol/L (ref 20–29)
Calcium: 9.4 mg/dL (ref 8.7–10.3)
Chloride: 102 mmol/L (ref 96–106)
Creatinine, Ser: 0.73 mg/dL (ref 0.57–1.00)
GFR calc Af Amer: 88 mL/min/{1.73_m2} (ref >59)
GFR calc non Af Amer: 76 mL/min/{1.73_m2} (ref >59)
Globulin, Total: 2.4 g/dL (ref 1.5–4.5)
Glucose: 101 mg/dL — ABNORMAL HIGH (ref 65–99)
Potassium: 3.8 mmol/L (ref 3.5–5.2)
Sodium: 144 mmol/L (ref 134–144)
Total Protein: 6.4 g/dL (ref 6.0–8.5)

## 2018-12-14 LAB — CBC WITH DIFFERENTIAL/PLATELET
Basophils Absolute: 0 10*3/uL (ref 0.0–0.2)
Basos: 1 %
EOS (ABSOLUTE): 0.3 10*3/uL (ref 0.0–0.4)
Eos: 4 %
Hematocrit: 41.9 % (ref 34.0–46.6)
Hemoglobin: 14.2 g/dL (ref 11.1–15.9)
Immature Grans (Abs): 0 10*3/uL (ref 0.0–0.1)
Immature Granulocytes: 1 %
Lymphocytes Absolute: 2 10*3/uL (ref 0.7–3.1)
Lymphs: 27 %
MCH: 30.9 pg (ref 26.6–33.0)
MCHC: 33.9 g/dL (ref 31.5–35.7)
MCV: 91 fL (ref 79–97)
Monocytes Absolute: 0.6 10*3/uL (ref 0.1–0.9)
Monocytes: 9 %
Neutrophils Absolute: 4.4 10*3/uL (ref 1.4–7.0)
Neutrophils: 58 %
Platelets: 242 10*3/uL (ref 150–450)
RBC: 4.6 x10E6/uL (ref 3.77–5.28)
RDW: 13.3 % (ref 11.7–15.4)
WBC: 7.4 10*3/uL (ref 3.4–10.8)

## 2018-12-14 LAB — LIPID PANEL
Chol/HDL Ratio: 5.2 ratio — ABNORMAL HIGH (ref 0.0–4.4)
Cholesterol, Total: 177 mg/dL (ref 100–199)
HDL: 34 mg/dL — ABNORMAL LOW (ref >39)
LDL Chol Calc (NIH): 95 mg/dL (ref 0–99)
Triglycerides: 282 mg/dL — ABNORMAL HIGH (ref 0–149)
VLDL Cholesterol Cal: 48 mg/dL — ABNORMAL HIGH (ref 5–40)

## 2019-02-06 ENCOUNTER — Ambulatory Visit: Payer: Medicare Other | Admitting: Family Medicine

## 2019-02-06 ENCOUNTER — Other Ambulatory Visit: Payer: Self-pay

## 2019-02-06 ENCOUNTER — Encounter: Payer: Self-pay | Admitting: Orthopaedic Surgery

## 2019-02-06 ENCOUNTER — Ambulatory Visit (INDEPENDENT_AMBULATORY_CARE_PROVIDER_SITE_OTHER): Payer: Medicare Other | Admitting: Orthopaedic Surgery

## 2019-02-06 ENCOUNTER — Ambulatory Visit (INDEPENDENT_AMBULATORY_CARE_PROVIDER_SITE_OTHER): Payer: Medicare Other | Admitting: Family Medicine

## 2019-02-06 VITALS — Ht 65.0 in | Wt 224.0 lb

## 2019-02-06 DIAGNOSIS — M7061 Trochanteric bursitis, right hip: Secondary | ICD-10-CM | POA: Diagnosis not present

## 2019-02-06 DIAGNOSIS — J9801 Acute bronchospasm: Secondary | ICD-10-CM | POA: Diagnosis not present

## 2019-02-06 MED ORDER — ALBUTEROL SULFATE HFA 108 (90 BASE) MCG/ACT IN AERS: 2.0000 | INHALATION_SPRAY | Freq: Four times a day (QID) | RESPIRATORY_TRACT | 0 refills | Status: DC | PRN

## 2019-02-06 MED ORDER — PREDNISONE 20 MG PO TABS: 40.0000 mg | ORAL_TABLET | Freq: Every day | ORAL | 0 refills | Status: AC

## 2019-02-06 NOTE — Progress Notes (Signed)
Telephone visit  Subjective: CC: chest congestion PCP: Dettinger, Fransisca Kaufmann, MD Anna Bradford is a 86 y.o. female calls for telephone consult today. Patient provides verbal consent for consult held via phone.  Due to COVID-19 pandemic this visit was conducted virtually. This visit type was conducted due to national recommendations for restrictions regarding the COVID-19 Pandemic (e.g. social distancing, sheltering in place) in an effort to limit this patient's exposure and mitigate transmission in our community. All issues noted in this document were discussed and addressed.  A physical exam was not performed with this format.   Location of patient: drug store Location of provider: Working remotely from home Others present for call: none  1. Chest congestion Patient reports onset of chest congestion 1-2 weeks.  She reports productive cough and wheezing at night time.  No fevers, myalgia, headache, diarrhea, nausea, vomiting.  She has taken Mucinex DM x2 boxes.  She gets bronchitis yearly.  No progression just not getting better.  She has nasal drainage in the morning.  She takes OTC allergy pill.  No Asthma, COPD. Nonsmoker.   ROS: Per HPI  Allergies  Allergen Reactions  . Macrolides And Ketolides     Bleeding thru bowels  . Codeine Nausea And Vomiting  . Sulfa Antibiotics Nausea And Vomiting   Past Medical History:  Diagnosis Date  . Arthritis   . Cancer [facility]) 1996   left breast  . Essential hypertension, benign   . GERD (gastroesophageal reflux disease)     Current Outpatient Medications:  .  amLODipine (NORVASC) 5 MG tablet, Take 1 tablet (5 mg total) by mouth daily., Disp: 90 tablet, Rfl: 3 .  Ascorbic Acid (VITAMIN C) 1000 MG tablet, Take 1,000 mg by mouth daily. , Disp: , Rfl:  .  aspirin 81 MG tablet, Take 81 mg by mouth daily., Disp: , Rfl:  .  Cholecalciferol (VITAMIN D-3) 1000 UNITS CAPS, Take 2,000 Units by mouth daily., Disp: , Rfl:  .  clobetasol cream  (TEMOVATE) [id number] %, Apply 1 application topically 2 (two) times daily., Disp: 30 g, Rfl: 0 .  diphenhydrAMINE (BENADRYL ALLERGY) 25 MG tablet, Take 25 mg by mouth daily as needed (allergies). , Disp: , Rfl:  .  ibuprofen (ADVIL,MOTRIN) 200 MG tablet, Take 800 mg by mouth 2 (two) times daily. , Disp: , Rfl:  .  losartan-hydrochlorothiazide (HYZAAR) 100-25 MG tablet, Take 1 tablet by mouth daily., Disp: 90 tablet, Rfl: 3 .  magnesium hydroxide (MILK OF MAGNESIA) 400 MG/5ML suspension, Take by mouth as needed for mild constipation., Disp: , Rfl:  .  Multiple Minerals-Vitamins (CALCIUM-MAGNESIUM-ZINC-D3 PO), Take 1 tablet by mouth daily., Disp: , Rfl:  .  Multiple Vitamin (MULTIVITAMIN) tablet, Take 1 tablet by mouth daily., Disp: , Rfl:  .  nystatin cream (MYCOSTATIN), Apply 1 application topically daily., Disp: , Rfl:  .  Polyethyl Glycol-Propyl Glycol (SYSTANE OP), Place 1 drop into both eyes daily as needed (dry eyes)., Disp: , Rfl:  .  Potassium 99 MG TABS, Take by mouth., Disp: , Rfl:  .  zinc gluconate 50 MG tablet, Take 50 mg by mouth daily., Disp: , Rfl:   Assessment/ Plan: 86 y.o. female   1. Bronchospasm Does not sound infectious at this point.  We will treat with albuterol and a little bit of steroid to see if we can get her airway opened up some.  Okay to continue the Mucinex, allergy pill.  Encourage fluids.  We discussed reasons for reevaluation and signs and  symptoms of infection.  She voiced good understanding will contact me if she needs an antibiotic going forward. - predniSONE (DELTASONE) 20 MG tablet; Take 2 tablets (40 mg total) by mouth daily with breakfast for 5 days.  Dispense: 10 tablet; Refill: 0 - albuterol (VENTOLIN HFA) 108 (90 Base) MCG/ACT inhaler; Inhale 2 puffs into the lungs every 6 (six) hours as needed for wheezing or shortness of breath.  Dispense: 8 g; Refill: 0   Start time: 3:59pm End time: 4:08pm  Total time spent on patient care (including telephone call/  virtual visit): 15 minutes  [place], [place] [phone]

## 2019-02-06 NOTE — Progress Notes (Signed)
PROCEDURE NOTE:  The patient request injection, verbal consent was obtained.  The right trochanteric area of the hip was prepped appropriately after time out was performed.   Sterile technique was observed and injection of 1 cc of Depo-Medrol 40 mg with several cc's of plain xylocaine. Anesthesia was provided by ethyl chloride and a 20-gauge needle was used to inject the hip area. The injection was tolerated well.  A band aid dressing was applied.  The patient was advised to apply ice later today and tomorrow to the injection sight as needed.  See in one month.  Call if any problem.  Precautions discussed.   Electronically Signed Sanjuana Kava, MD 1/19/20211:58 PM

## 2019-03-06 ENCOUNTER — Ambulatory Visit: Payer: Medicare Other | Admitting: Orthopaedic Surgery

## 2019-03-14 ENCOUNTER — Emergency Department (HOSPITAL_COMMUNITY)
Admission: EM | Admit: 2019-03-14 | Discharge: 2019-03-14 | Disposition: A | Discharge status: Home / Self Care | Payer: Medicare Other | Attending: Emergency Medicine | Admitting: Emergency Medicine

## 2019-03-14 ENCOUNTER — Encounter: Payer: Medicare Other | Admitting: Nurse Practitioner

## 2019-03-14 ENCOUNTER — Encounter (HOSPITAL_COMMUNITY): Payer: Self-pay | Admitting: Emergency Medicine

## 2019-03-14 ENCOUNTER — Emergency Department (HOSPITAL_COMMUNITY): Payer: Medicare Other

## 2019-03-14 ENCOUNTER — Other Ambulatory Visit: Payer: Self-pay

## 2019-03-14 ENCOUNTER — Encounter: Payer: Self-pay | Admitting: Nurse Practitioner

## 2019-03-14 DIAGNOSIS — Z79899 Other long term (current) drug therapy: Secondary | ICD-10-CM | POA: Diagnosis not present

## 2019-03-14 DIAGNOSIS — K5732 Diverticulitis of large intestine without perforation or abscess without bleeding: Secondary | ICD-10-CM | POA: Diagnosis not present

## 2019-03-14 DIAGNOSIS — K5700 Diverticulitis of small intestine with perforation and abscess without bleeding: Secondary | ICD-10-CM

## 2019-03-14 DIAGNOSIS — Z853 Personal history of malignant neoplasm of breast: Secondary | ICD-10-CM | POA: Insufficient documentation

## 2019-03-14 DIAGNOSIS — R1032 Left lower quadrant pain: Secondary | ICD-10-CM | POA: Diagnosis present

## 2019-03-14 DIAGNOSIS — Z7982 Long term (current) use of aspirin: Secondary | ICD-10-CM | POA: Diagnosis not present

## 2019-03-14 DIAGNOSIS — I10 Essential (primary) hypertension: Secondary | ICD-10-CM | POA: Insufficient documentation

## 2019-03-14 DIAGNOSIS — R109 Unspecified abdominal pain: Secondary | ICD-10-CM | POA: Diagnosis not present

## 2019-03-14 LAB — CBC
HCT: 41.5 % (ref 36.0–46.0)
Hemoglobin: 13.4 g/dL (ref 12.0–15.0)
MCH: 30.4 pg (ref 26.0–34.0)
MCHC: 32.3 g/dL (ref 30.0–36.0)
MCV: 94.1 fL (ref 80.0–100.0)
Platelets: 219 10*3/uL (ref 150–400)
RBC: 4.41 MIL/uL (ref 3.87–5.11)
RDW: 13.2 % (ref 11.5–15.5)
WBC: 12.2 10*3/uL — ABNORMAL HIGH (ref 4.0–10.5)
nRBC: 0 % (ref 0.0–0.2)

## 2019-03-14 LAB — URINALYSIS, ROUTINE W REFLEX MICROSCOPIC
Bilirubin Urine: NEGATIVE
Glucose, UA: NEGATIVE mg/dL
Hgb urine dipstick: NEGATIVE
Ketones, ur: NEGATIVE mg/dL
Nitrite: NEGATIVE
Protein, ur: NEGATIVE mg/dL
Specific Gravity, Urine: 1.016 (ref 1.005–1.030)
pH: 6 (ref 5.0–8.0)

## 2019-03-14 LAB — COMPREHENSIVE METABOLIC PANEL
ALT: 18 U/L (ref 0–44)
AST: 17 U/L (ref 15–41)
Albumin: 3.4 g/dL — ABNORMAL LOW (ref 3.5–5.0)
Alkaline Phosphatase: 75 U/L (ref 38–126)
Anion gap: 9 (ref 5–15)
BUN: 18 mg/dL (ref 8–23)
CO2: 30 mmol/L (ref 22–32)
Calcium: 8.7 mg/dL — ABNORMAL LOW (ref 8.9–10.3)
Chloride: 103 mmol/L (ref 98–111)
Creatinine, Ser: 0.85 mg/dL (ref 0.44–1.00)
GFR calc Af Amer: >60 mL/min (ref >60)
GFR calc non Af Amer: >60 mL/min (ref >60)
Glucose, Bld: 101 mg/dL — ABNORMAL HIGH (ref 70–99)
Potassium: 3.5 mmol/L (ref 3.5–5.1)
Sodium: 142 mmol/L (ref 135–145)
Total Bilirubin: 0.8 mg/dL (ref 0.3–1.2)
Total Protein: 6.6 g/dL (ref 6.5–8.1)

## 2019-03-14 LAB — LIPASE, BLOOD: Lipase: 27 U/L (ref 11–51)

## 2019-03-14 MED ORDER — METRONIDAZOLE 500 MG PO TABS: 500.0000 mg | ORAL_TABLET | Freq: Three times a day (TID) | ORAL | 0 refills | Status: DC

## 2019-03-14 MED ORDER — CIPROFLOXACIN HCL 500 MG PO TABS: 500.0000 mg | ORAL_TABLET | Freq: Two times a day (BID) | ORAL | 0 refills | Status: DC

## 2019-03-14 MED ORDER — SODIUM CHLORIDE 0.9% FLUSH: 3.0000 mL | Freq: Once | INTRAVENOUS | Status: DC

## 2019-03-14 MED ORDER — IOHEXOL 300 MG/ML  SOLN
100.0000 mL | Freq: Once | INTRAMUSCULAR | Status: AC | PRN
  Administered 2019-03-14: 100 mL via INTRAVENOUS

## 2019-03-14 NOTE — ED Provider Notes (Signed)
Medical screening examination/treatment/procedure(s) were conducted as a shared visit with non-physician practitioner(s) and myself.  I personally evaluated the patient during the encounter.      Results for orders placed or performed during the hospital encounter of 03/14/19  Lipase, blood  Result Value Ref Range   Lipase 27 11 - 51 U/L  Comprehensive metabolic panel  Result Value Ref Range   Sodium 142 135 - 145 mmol/L   Potassium 3.5 3.5 - 5.1 mmol/L   Chloride 103 98 - 111 mmol/L   CO2 30 22 - 32 mmol/L   Glucose, Bld 101 (H) 70 - 99 mg/dL   BUN 18 8 - 23 mg/dL   Creatinine, Ser 0.85 0.44 - 1.00 mg/dL   Calcium 8.7 (L) 8.9 - 10.3 mg/dL   Total Protein 6.6 6.5 - 8.1 g/dL   Albumin 3.4 (L) 3.5 - 5.0 g/dL   AST 17 15 - 41 U/L   ALT 18 0 - 44 U/L   Alkaline Phosphatase 75 38 - 126 U/L   Total Bilirubin 0.8 0.3 - 1.2 mg/dL   GFR calc non Af Amer >60 >60 mL/min   GFR calc Af Amer >60 >60 mL/min   Anion gap 9 5 - 15  CBC  Result Value Ref Range   WBC 12.2 (H) 4.0 - 10.5 K/uL   RBC 4.41 3.87 - 5.11 MIL/uL   Hemoglobin 13.4 12.0 - 15.0 g/dL   HCT 41.5 36.0 - 46.0 %   MCV 94.1 80.0 - 100.0 fL   MCH 30.4 26.0 - 34.0 pg   MCHC 32.3 30.0 - 36.0 g/dL   RDW 13.2 11.5 - 15.5 %   Platelets 219 150 - 400 K/uL   nRBC 0.0 0.0 - 0.2 %  Urinalysis, Routine w reflex microscopic  Result Value Ref Range   Color, Urine YELLOW YELLOW   APPearance CLEAR CLEAR   Specific Gravity, Urine 1.016 1.005 - 1.030   pH 6.0 5.0 - 8.0   Glucose, UA NEGATIVE NEGATIVE mg/dL   Hgb urine dipstick NEGATIVE NEGATIVE   Bilirubin Urine NEGATIVE NEGATIVE   Ketones, ur NEGATIVE NEGATIVE mg/dL   Protein, ur NEGATIVE NEGATIVE mg/dL   Nitrite NEGATIVE NEGATIVE   Leukocytes,Ua TRACE (A) NEGATIVE   RBC / HPF 0-5 0 - 5 RBC/hpf   WBC, UA 6-10 0 - 5 WBC/hpf   Bacteria, UA RARE (A) NONE SEEN   Squamous Epithelial / LPF 0-5 0 - 5   Mucus PRESENT    CT ABDOMEN PELVIS W CONTRAST  Result Date: 03/14/2019 CLINICAL  DATA:  Left lower quadrant abdominal pain. History abdominal wall hernias. EXAM: CT ABDOMEN AND PELVIS WITH CONTRAST TECHNIQUE: Multidetector CT imaging of the abdomen and pelvis was performed using the standard protocol following bolus administration of intravenous contrast. CONTRAST:  151mL OMNIPAQUE IOHEXOL 300 MG/ML  SOLN COMPARISON:  05/13/2017. FINDINGS: Lower chest: The lung bases are clear. The heart size is normal. Hepatobiliary: The liver is normal. Status post cholecystectomy.Again noted is stable extrahepatic biliary ductal dilatation, likely representing a reservoir effect status post cholecystectomy. Pancreas: Normal contours without ductal dilatation. No peripancreatic fluid collection. Spleen: There is an ill-defined 2.2 cm hypoattenuating lesion in the spleen (axial series 2, image 19). Adrenals/Urinary Tract: --Adrenal glands: No adrenal hemorrhage. --Right kidney/ureter: Multiple peripelvic cysts are noted. There is no hydronephrosis. --Left kidney/ureter: Multiple peripelvic cysts are noted. There is no hydronephrosis. --Urinary bladder: Bladder is decompressed which limits evaluation. Stomach/Bowel: --Stomach/Duodenum: No hiatal hernia or other gastric abnormality. Normal duodenal  course and caliber. --Small bowel: There appears to be an area of small-bowel diverticulitis involving the mid jejunum (axial series 2, image 39). There are pockets of extraluminal gas in this location consistent with macro perforation. There is no drainable fluid collection at this time. --Colon: There are scattered colonic diverticula without CT evidence for diverticulitis. --Appendix: Not visualized. No right lower quadrant inflammation or free fluid. Vascular/Lymphatic: Atherosclerotic calcification is present within the non-aneurysmal abdominal aorta, without hemodynamically significant stenosis. --No retroperitoneal lymphadenopathy. --No mesenteric lymphadenopathy. --No pelvic or inguinal lymphadenopathy.  Reproductive: Status post hysterectomy. No adnexal mass. Other: There is no significant free fluid in the abdomen. There is a small fat containing umbilical hernia. Musculoskeletal. No acute displaced fractures. IMPRESSION: 1. Findings consistent with perforated small bowel diverticulitis involving the mid jejunum as detailed above. There is no abscess formation at this time. 2. Colonic diverticulosis without CT evidence for diverticulitis. 3. Indeterminate 2.2 cm hypoattenuating lesion in the spleen. Follow-up with a nonemergent outpatient contrast enhanced MRI is recommended. 4. Status post cholecystectomy. 5.  Aortic Atherosclerosis (ICD10-I70.0). Electronically Signed   By: Constance Holster M.D.   On: 03/14/2019 17:20    Patient seen by me along with physician assistant.  Patient with development of left lower quadrant abdominal pain that was sharp yesterday morning.  Patient was concerned about a hernia problem.  Patient's had multiple surgeries in the past.  There is also concerned about bowel obstruction.  Patient's abdomen without any significant tenderness.  CT scan showed evidence of jejunal diverticulitis.  But no large bowel diverticulitis.  We clarified with the radiologist that is a well contained perforation.  No large amount of free air.  This would be equivalent to the terminology that we often uses a microperforation.  The patient can be treated with oral antibiotics and close follow-up.  Patient nontoxic no acute distress.  Labs significant for a little bit of mild leukocytosis.  Electrolytes without significant abnormalities.   Fredia Sorrow, MD 03/14/19 1759

## 2019-03-14 NOTE — ED Provider Notes (Signed)
[facility] Provider Note   CSN: [id number] Arrival date & time: 03/14/19  1412     History Chief Complaint  Patient presents with  . Abdominal Pain    Anna Bradford is a 86 y.o. female.  HPI      Anna Bradford is a 86 y.o. female who presents to the Emergency Department complaining of left lower abdominal pain that began suddenly yesterday while walking.  She states that she felt something "drop" from her mid left abdomen to the lower abdomen.  Pain is worse with standing and improves while sitting and lying supine.  She denies nausea, vomiting, fever, chills and back or flank pain.  Last BM was yesterday and brown in color, no diarrhea.  She reports hx multiple abdominal surgeries with previous hernia repair.  No hx of bowel obstruction.   Past Medical History:  Diagnosis Date  . Arthritis   . Cancer [facility]) 1996   left breast  . Essential hypertension, benign   . GERD (gastroesophageal reflux disease)     Patient Active Problem List   Diagnosis Date Noted  . Osteoarthritis 09/11/2018  . Mass of subcutaneous tissue of back 01/19/2018  . Mass of soft tissue of abdomen 01/19/2018  . Severe obesity (BMI 35.0-39.9) with comorbidity ([place]) 12/13/2017  . Osteoporosis without current pathological fracture 10/01/2015  . Varicose veins of lower extremities with other complications [id number]  . Precordial pain 08/27/2011  . Essential hypertension, benign 08/27/2011  . GERD (gastroesophageal reflux disease) 08/27/2011    Past Surgical History:  Procedure Laterality Date  . ABDOMINAL HYSTERECTOMY    . Absess Off Intestine    . cataract surgery Left 08-10-2011  . cataract surgery Right 11-01-2011  . CHOLECYSTECTOMY     1972  . COLONOSCOPY N/A 02/06/2015   Procedure: COLONOSCOPY;  Surgeon: Rogene Houston, MD;  Location: [facility] ENDO SUITE;  Service: Endoscopy;  Laterality: N/A;  1015  . GALLBLADDER SURGERY     Rupture Lower Stomach  . HERNIA REPAIR    .  Hysterectomy ---- unknown     1980  . [place] Tumor    . Left Breast Masectomy     1986  . MASS EXCISION N/A 01/30/2018   Procedure: EXCISION MASS 3CM ON BACK AND 3CM ON ABDOMEN;  Surgeon: Virl Cagey, MD;  Location: [facility] ORS;  Service: General;  Laterality: N/A;  . Right Ankle Surgery       OB History    Gravida      Para      Term      Preterm      AB      Living  2     SAB      TAB      Ectopic      Multiple      Live Births              Family History  Problem Relation Age of Onset  . Cancer Mother   . Coronary artery disease Mother   . Heart disease Sister     Social History   Tobacco Use  . Smoking status: Never Smoker  . Smokeless tobacco: Never Used  Substance Use Topics  . Alcohol use: No  . Drug use: No    Home Medications Prior to Admission medications   Medication Sig Start Date End Date Taking? Authorizing Provider  albuterol (VENTOLIN HFA) 108 (90 Base) MCG/ACT inhaler Inhale 2 puffs into the lungs every 6 (  six) hours as needed for wheezing or shortness of breath. 02/06/19   Janora Norlander, DO  amLODipine (NORVASC) 5 MG tablet Take 1 tablet (5 mg total) by mouth daily. 11/28/18   Dettinger, Fransisca Kaufmann, MD  Ascorbic Acid (VITAMIN C) 1000 MG tablet Take 1,000 mg by mouth daily.     [provider]  aspirin 81 MG tablet Take 81 mg by mouth daily.    [provider]  Cholecalciferol (VITAMIN D-3) 1000 UNITS CAPS Take 2,000 Units by mouth daily.    [provider]  clobetasol cream (TEMOVATE) AB-123456789 % Apply 1 application topically 2 (two) times daily. 10/31/18   Sharion Balloon, FNP  diphenhydrAMINE (BENADRYL ALLERGY) 25 MG tablet Take 25 mg by mouth daily as needed (allergies).     [provider]  ibuprofen (ADVIL,MOTRIN) 200 MG tablet Take 800 mg by mouth 2 (two) times daily.     [provider]  losartan-hydrochlorothiazide (HYZAAR) 100-25 MG tablet Take 1 tablet by mouth daily. 11/28/18    Dettinger, Fransisca Kaufmann, MD  magnesium hydroxide (MILK OF MAGNESIA) 400 MG/5ML suspension Take by mouth as needed for mild constipation.    [provider]  Multiple Minerals-Vitamins (CALCIUM-MAGNESIUM-ZINC-D3 PO) Take 1 tablet by mouth daily.    [provider]  Multiple Vitamin (MULTIVITAMIN) tablet Take 1 tablet by mouth daily.    [provider]  nystatin cream (MYCOSTATIN) Apply 1 application topically daily.    [provider]  Polyethyl Glycol-Propyl Glycol (SYSTANE OP) Place 1 drop into both eyes daily as needed (dry eyes).    [provider]  Potassium 99 MG TABS Take by mouth.    [provider]  zinc gluconate 50 MG tablet Take 50 mg by mouth daily.    [provider]    Allergies    Macrolides and ketolides, Codeine, and Sulfa antibiotics  Review of Systems   Review of Systems  Constitutional: Negative for fever.  Respiratory: Negative for cough, chest tightness and shortness of breath.   Cardiovascular: Negative for chest pain.  Gastrointestinal: Positive for abdominal pain. Negative for abdominal distention, blood in stool, diarrhea, nausea and vomiting.  Genitourinary: Negative for dysuria and flank pain.  Musculoskeletal: Negative for back pain.  Skin: Negative for rash and wound.  Neurological: Negative for dizziness, weakness, numbness and headaches.  Psychiatric/Behavioral: Negative for confusion.    Physical Exam Updated Vital Signs BP (!) 156/59 (BP Location: Right Arm)   Pulse 81   Temp 98.4 F (36.9 C) (Oral)   Resp 17   Ht 5\' 5"  (1.651 m)   Wt 99.8 kg   SpO2 97%   BMI 36.61 kg/m   Physical Exam Vitals and nursing note reviewed.  Constitutional:      General: She is not in acute distress.    Appearance: She is well-developed. She is not ill-appearing or toxic-appearing.  Cardiovascular:     Rate and Rhythm: Normal rate and regular rhythm.     Pulses: Normal pulses.  Pulmonary:      Effort: Pulmonary effort is normal.     Breath sounds: Normal breath sounds.  Chest:     Chest wall: No tenderness.  Abdominal:     General: There is no distension.     Palpations: Abdomen is soft. There is no mass.     Tenderness: There is abdominal tenderness. There is no right CVA tenderness, left CVA tenderness or guarding.     Comments: Focal ttp of the mid  to lower left abdomen. Exam somewhat limited by body habitus.  No palpable hernia.    Musculoskeletal:     Cervical back: Normal range of motion.     Right lower leg: No edema.     Left lower leg: No edema.  Skin:    General: Skin is warm.     Capillary Refill: Capillary refill takes less than 2 seconds.  Neurological:     General: No focal deficit present.     Mental Status: She is alert.     Sensory: No sensory deficit.     Motor: No weakness.     ED Results / Procedures / Treatments   Labs (all labs ordered are listed, but only abnormal results are displayed) Labs Reviewed  COMPREHENSIVE METABOLIC PANEL - Abnormal; Notable for the following components:      Result Value   Glucose, Bld 101 (*)    Calcium 8.7 (*)    Albumin 3.4 (*)    All other components within normal limits  CBC - Abnormal; Notable for the following components:   WBC 12.2 (*)    All other components within normal limits  URINALYSIS, ROUTINE W REFLEX MICROSCOPIC - Abnormal; Notable for the following components:   Leukocytes,Ua TRACE (*)    Bacteria, UA RARE (*)    All other components within normal limits  LIPASE, BLOOD    EKG None  Radiology CT ABDOMEN PELVIS W CONTRAST  Result Date: 03/14/2019 CLINICAL DATA:  Left lower quadrant abdominal pain. History abdominal wall hernias. EXAM: CT ABDOMEN AND PELVIS WITH CONTRAST TECHNIQUE: Multidetector CT imaging of the abdomen and pelvis was performed using the standard protocol following bolus administration of intravenous contrast. CONTRAST:  141mL OMNIPAQUE IOHEXOL 300 MG/ML  SOLN COMPARISON:   05/13/2017. FINDINGS: Lower chest: The lung bases are clear. The heart size is normal. Hepatobiliary: The liver is normal. Status post cholecystectomy.Again noted is stable extrahepatic biliary ductal dilatation, likely representing a reservoir effect status post cholecystectomy. Pancreas: Normal contours without ductal dilatation. No peripancreatic fluid collection. Spleen: There is an ill-defined 2.2 cm hypoattenuating lesion in the spleen (axial series 2, image 19). Adrenals/Urinary Tract: --Adrenal glands: No adrenal hemorrhage. --Right kidney/ureter: Multiple peripelvic cysts are noted. There is no hydronephrosis. --Left kidney/ureter: Multiple peripelvic cysts are noted. There is no hydronephrosis. --Urinary bladder: Bladder is decompressed which limits evaluation. Stomach/Bowel: --Stomach/Duodenum: No hiatal hernia or other gastric abnormality. Normal duodenal course and caliber. --Small bowel: There appears to be an area of small-bowel diverticulitis involving the mid jejunum (axial series 2, image 39). There are pockets of extraluminal gas in this location consistent with macro perforation. There is no drainable fluid collection at this time. --Colon: There are scattered colonic diverticula without CT evidence for diverticulitis. --Appendix: Not visualized. No right lower quadrant inflammation or free fluid. Vascular/Lymphatic: Atherosclerotic calcification is present within the non-aneurysmal abdominal aorta, without hemodynamically significant stenosis. --No retroperitoneal lymphadenopathy. --No mesenteric lymphadenopathy. --No pelvic or inguinal lymphadenopathy. Reproductive: Status post hysterectomy. No adnexal mass. Other: There is no significant free fluid in the abdomen. There is a small fat containing umbilical hernia. Musculoskeletal. No acute displaced fractures. IMPRESSION: 1. Findings consistent with perforated small bowel diverticulitis involving the mid jejunum as detailed above. There is no  abscess formation at this time. 2. Colonic diverticulosis without CT evidence for diverticulitis. 3. Indeterminate 2.2 cm hypoattenuating lesion in the spleen. Follow-up with a nonemergent outpatient contrast enhanced MRI is recommended. 4. Status post cholecystectomy. 5.  Aortic Atherosclerosis (ICD10-I70.0). Electronically Signed  By: Constance Holster M.D.   On: 03/14/2019 17:20    Procedures Procedures (including critical care time)  Medications Ordered in ED Medications - No data to display  ED Course  I have reviewed the triage vital signs and the nursing notes.  Pertinent labs & imaging results that were available during my care of the patient were reviewed by me and considered in my medical decision making (see chart for details).    MDM Rules/Calculators/A&P                      Spoke with the radiologist, Dr. Nyoka Cowden for further clarification regarding perforation.  He felt that the amt of extraluminal free air is relatively small and mostly contained.  He felt that pt is appropriate for oral abx and close f/u.  I have discussed this with the patient and her son at patient's request.  She agrees to close f/u with PCP and strict return precautions given.       Pt also seen by Dr. Rogene Houston  Final Clinical Impression(s) / ED Diagnoses Final diagnoses:  Diverticulitis of small intestine with perforation without abscess or bleeding    Rx / DC Orders ED Discharge Orders    None       Kem Parkinson, PA-C 03/14/19 1836    Fredia Sorrow, MD 03/16/19 0020

## 2019-03-14 NOTE — ED Triage Notes (Signed)
Patient reports LLQ pain that started yesterday am. States she felt "like something ruptured." Denies n/v or diarrhea. Last BM this am.

## 2019-03-14 NOTE — Progress Notes (Signed)
   Virtual Visit via telephone Note Due to COVID-19 pandemic this visit was conducted virtually. This visit type was conducted due to national recommendations for restrictions regarding the COVID-19 Pandemic (e.g. social distancing, sheltering in place) in an effort to limit this patient's exposure and mitigate transmission in our community. All issues noted in this document were discussed and addressed.  A physical exam was not performed with this format.  I connected with Chanah Ofallon Vandall on 03/14/19 at 12:55 by telephone and verified that I am speaking with the correct person using two identifiers. Lorrae Haywood Holsomback is currently located at home and no one is currently with her during visit. The provider, Mary-Margaret Hassell Done, FNP is located in their office at time of visit.  I discussed the limitations, risks, security and privacy concerns of performing an evaluation and management service by telephone and the availability of in person appointments. I also discussed with the patient that there may be a patient responsible charge related to this service. The patient expressed understanding and agreed to proceed.   History and Present Illness:   Chief Complaint: Abdominal Pain   HPI Patient called her insurance company and they told her to go to the hospital.

## 2019-03-14 NOTE — Discharge Instructions (Addendum)
Is important that you take the antibiotics as directed until they are finished.  Follow-up with your primary doctor for recheck in 2 to 3 days.  Return to the emergency department if you develop worsening pain, fever, or vomiting.

## 2019-03-14 NOTE — ED Notes (Signed)
Patient transported to CT 

## 2019-03-15 ENCOUNTER — Ambulatory Visit: Payer: Medicare Other | Admitting: Family Medicine

## 2019-03-21 ENCOUNTER — Ambulatory Visit (INDEPENDENT_AMBULATORY_CARE_PROVIDER_SITE_OTHER): Payer: Medicare Other | Admitting: Family Medicine

## 2019-03-21 ENCOUNTER — Encounter: Payer: Self-pay | Admitting: Family Medicine

## 2019-03-21 DIAGNOSIS — K5792 Diverticulitis of intestine, part unspecified, without perforation or abscess without bleeding: Secondary | ICD-10-CM | POA: Diagnosis not present

## 2019-03-21 DIAGNOSIS — R110 Nausea: Secondary | ICD-10-CM | POA: Diagnosis not present

## 2019-03-21 MED ORDER — ONDANSETRON 4 MG PO TBDP: 4.0000 mg | ORAL_TABLET | Freq: Three times a day (TID) | ORAL | 0 refills | Status: DC | PRN

## 2019-03-21 MED ORDER — FAMOTIDINE 20 MG PO TABS: 20.0000 mg | ORAL_TABLET | Freq: Two times a day (BID) | ORAL | 1 refills | Status: DC

## 2019-03-21 NOTE — Progress Notes (Signed)
Virtual Visit via telephone Note  I connected with Anna Bradford on 03/21/19 at 1550 by telephone and verified that I am speaking with the correct person using two identifiers. Anna Bradford is currently located at home and no other people are currently with her during visit. The provider, Fransisca Kaufmann Dettinger, MD is located in their office at time of visit.  Call ended at 1605  I discussed the limitations, risks, security and privacy concerns of performing an evaluation and management service by telephone and the availability of in person appointments. I also discussed with the patient that there may be a patient responsible charge related to this service. The patient expressed understanding and agreed to proceed.   History and Present Illness: Patient is calling in for ED follow up for diverticulitis.  She is still taking cipro and metronidazole and has left side abdomen her pain is gone and now it hurts.  She denies blood in stool , she has nausea and some right sided flank and right under ribs.  She denies any fevers or chills or vomiting.  She does have nausea. She has slightly loose stools. She was in ED on 03/14/19  1. Diverticulitis   2. Nausea     Outpatient Encounter Medications as of 03/21/2019  Medication Sig  . albuterol (VENTOLIN HFA) 108 (90 Base) MCG/ACT inhaler Inhale 2 puffs into the lungs every 6 (six) hours as needed for wheezing or shortness of breath.  Marland Kitchen amLODipine (NORVASC) 5 MG tablet Take 1 tablet (5 mg total) by mouth daily.  . Ascorbic Acid (VITAMIN C) 1000 MG tablet Take 1,000 mg by mouth daily.   Marland Kitchen aspirin 81 MG tablet Take 81 mg by mouth daily.  . Cholecalciferol (VITAMIN D-3) 1000 UNITS CAPS Take 2,000 Units by mouth daily.  . ciprofloxacin (CIPRO) 500 MG tablet Take 1 tablet (500 mg total) by mouth 2 (two) times daily.  . clobetasol cream (TEMOVATE) AB-123456789 % Apply 1 application topically 2 (two) times daily.  . diphenhydrAMINE (BENADRYL ALLERGY) 25 MG tablet Take  25 mg by mouth daily as needed (allergies).   Marland Kitchen ibuprofen (ADVIL,MOTRIN) 200 MG tablet Take 800 mg by mouth 2 (two) times daily.   Marland Kitchen losartan-hydrochlorothiazide (HYZAAR) 100-25 MG tablet Take 1 tablet by mouth daily.  . magnesium hydroxide (MILK OF MAGNESIA) 400 MG/5ML suspension Take by mouth as needed for mild constipation.  . metroNIDAZOLE (FLAGYL) 500 MG tablet Take 1 tablet (500 mg total) by mouth 3 (three) times daily.  . Multiple Minerals-Vitamins (CALCIUM-MAGNESIUM-ZINC-D3 PO) Take 1 tablet by mouth daily.  . Multiple Vitamin (MULTIVITAMIN) tablet Take 1 tablet by mouth daily.  Marland Kitchen nystatin cream (MYCOSTATIN) Apply 1 application topically daily.  Vladimir Faster Glycol-Propyl Glycol (SYSTANE OP) Place 1 drop into both eyes daily as needed (dry eyes).  . Potassium 99 MG TABS Take by mouth.  . zinc gluconate 50 MG tablet Take 50 mg by mouth daily.  . famotidine (PEPCID) 20 MG tablet Take 1 tablet (20 mg total) by mouth 2 (two) times daily.  . ondansetron (ZOFRAN ODT) 4 MG disintegrating tablet Take 1 tablet (4 mg total) by mouth every 8 (eight) hours as needed for nausea or vomiting.   No facility-administered encounter medications on file as of 03/21/2019.    Review of Systems  Constitutional: Negative for chills and fever.  Eyes: Negative for visual disturbance.  Respiratory: Negative for chest tightness and shortness of breath.   Cardiovascular: Negative for chest pain and leg swelling.  Gastrointestinal: Positive for abdominal pain and nausea. Negative for constipation, diarrhea and vomiting.  Musculoskeletal: Negative for back pain and gait problem.  Skin: Negative for rash.  Neurological: Negative for light-headedness and headaches.  Psychiatric/Behavioral: Negative for agitation and behavioral problems.  All other systems reviewed and are negative.   Observations/Objective: Patient sounds comfortable and in no acute distress  Assessment and Plan: Problem List Items Addressed  This Visit    None    Visit Diagnoses    Diverticulitis    -  Primary   Relevant Medications   ondansetron (ZOFRAN ODT) 4 MG disintegrating tablet   famotidine (PEPCID) 20 MG tablet   Nausea       Relevant Medications   ondansetron (ZOFRAN ODT) 4 MG disintegrating tablet   famotidine (PEPCID) 20 MG tablet      Finish out antibiotics and gave nausea and pepcid to help while on antibiotics Follow up plan: Return if symptoms worsen or fail to improve.     I discussed the assessment and treatment plan with the patient. The patient was provided an opportunity to ask questions and all were answered. The patient agreed with the plan and demonstrated an understanding of the instructions.   The patient was advised to call back or seek an in-person evaluation if the symptoms worsen or if the condition fails to improve as anticipated.  The above assessment and management plan was discussed with the patient. The patient verbalized understanding of and has agreed to the management plan. Patient is aware to call the clinic if symptoms persist or worsen. Patient is aware when to return to the clinic for a follow-up visit. Patient educated on when it is appropriate to go to the emergency department.    I provided 15 minutes of non-face-to-face time during this encounter.    Worthy Rancher, MD

## 2019-04-16 ENCOUNTER — Ambulatory Visit: Payer: Medicare Other | Admitting: Family Medicine

## 2019-05-09 ENCOUNTER — Ambulatory Visit (INDEPENDENT_AMBULATORY_CARE_PROVIDER_SITE_OTHER): Payer: Medicare Other

## 2019-05-09 ENCOUNTER — Encounter: Payer: Self-pay | Admitting: Family Medicine

## 2019-05-09 ENCOUNTER — Ambulatory Visit (INDEPENDENT_AMBULATORY_CARE_PROVIDER_SITE_OTHER): Payer: Medicare Other | Admitting: Family Medicine

## 2019-05-09 DIAGNOSIS — M545 Low back pain, unspecified: Secondary | ICD-10-CM

## 2019-05-09 DIAGNOSIS — M546 Pain in thoracic spine: Secondary | ICD-10-CM | POA: Diagnosis not present

## 2019-05-09 NOTE — Progress Notes (Signed)
Virtual Visit via telephone Note  I connected with Anna Bradford on 05/09/19 at 1434 by telephone and verified that I am speaking with the correct person using two identifiers. Anna Bradford is currently located at home and no other people are currently with her during visit. The provider, Fransisca Kaufmann Dettinger, MD is located in their office at time of visit.  Call ended at 1447  I discussed the limitations, risks, security and privacy concerns of performing an evaluation and management service by telephone and the availability of in person appointments. I also discussed with the patient that there may be a patient responsible charge related to this service. The patient expressed understanding and agreed to proceed.   History and Present Illness: Patient is calling in for abdominal pain and when she gets up out of bed she has pain in her lower back and then if she lays on her side it pulls on her back and then comes around to stomach.  This has been going on for 2-3 months. She is laying in bed and she feels pulling on her right side and she feels like she has a lump under right ribs. She has bowel movements every day and had 2 today and she denies constipation or urinary issues.  She sits down and feels better and then can walk.   No diagnosis found.  Outpatient Encounter Medications as of 05/09/2019  Medication Sig  . albuterol (VENTOLIN HFA) 108 (90 Base) MCG/ACT inhaler Inhale 2 puffs into the lungs every 6 (six) hours as needed for wheezing or shortness of breath.  Marland Kitchen amLODipine (NORVASC) 5 MG tablet Take 1 tablet (5 mg total) by mouth daily.  . Ascorbic Acid (VITAMIN C) 1000 MG tablet Take 1,000 mg by mouth daily.   Marland Kitchen aspirin 81 MG tablet Take 81 mg by mouth daily.  . Cholecalciferol (VITAMIN D-3) 1000 UNITS CAPS Take 2,000 Units by mouth daily.  . ciprofloxacin (CIPRO) 500 MG tablet Take 1 tablet (500 mg total) by mouth 2 (two) times daily.  . clobetasol cream (TEMOVATE) [id number] % Apply 1  application topically 2 (two) times daily.  . diphenhydrAMINE (BENADRYL ALLERGY) 25 MG tablet Take 25 mg by mouth daily as needed (allergies).   . famotidine (PEPCID) 20 MG tablet Take 1 tablet (20 mg total) by mouth 2 (two) times daily.  Marland Kitchen ibuprofen (ADVIL,MOTRIN) 200 MG tablet Take 800 mg by mouth 2 (two) times daily.   Marland Kitchen losartan-hydrochlorothiazide (HYZAAR) 100-25 MG tablet Take 1 tablet by mouth daily.  . magnesium hydroxide (MILK OF MAGNESIA) 400 MG/5ML suspension Take by mouth as needed for mild constipation.  . metroNIDAZOLE (FLAGYL) 500 MG tablet Take 1 tablet (500 mg total) by mouth 3 (three) times daily.  . Multiple Minerals-Vitamins (CALCIUM-MAGNESIUM-ZINC-D3 PO) Take 1 tablet by mouth daily.  . Multiple Vitamin (MULTIVITAMIN) tablet Take 1 tablet by mouth daily.  Marland Kitchen nystatin cream (MYCOSTATIN) Apply 1 application topically daily.  . ondansetron (ZOFRAN ODT) 4 MG disintegrating tablet Take 1 tablet (4 mg total) by mouth every 8 (eight) hours as needed for nausea or vomiting.  Vladimir Faster Glycol-Propyl Glycol (SYSTANE OP) Place 1 drop into both eyes daily as needed (dry eyes).  . Potassium 99 MG TABS Take by mouth.  . zinc gluconate 50 MG tablet Take 50 mg by mouth daily.   No facility-administered encounter medications on file as of 05/09/2019.    Review of Systems  Constitutional: Negative for chills and fever.  Eyes: Negative for redness  and visual disturbance.  Respiratory: Negative for chest tightness and shortness of breath.   Cardiovascular: Negative for chest pain and leg swelling.  Gastrointestinal: Positive for abdominal pain. Negative for blood in stool, constipation, diarrhea, nausea and vomiting.  Genitourinary: Negative for dyspareunia, dysuria, flank pain, pelvic pain and urgency.  Musculoskeletal: Positive for back pain. Negative for gait problem.  Skin: Negative for rash.  Neurological: Negative for light-headedness and headaches.  Psychiatric/Behavioral:  Negative for agitation and behavioral problems.  All other systems reviewed and are negative.   Observations/Objective: Patient sounds comfortable and in no acute distress  Assessment and Plan: Problem List Items Addressed This Visit    None    Visit Diagnoses    Acute bilateral low back pain without sciatica    -  Primary   Relevant Orders   DG Lumbar Spine 2-3 Views   DG Thoracic Spine 2 View   Acute bilateral thoracic back pain       Relevant Orders   DG Lumbar Spine 2-3 Views   DG Thoracic Spine 2 View      Patient sounds like she has pain from her back that is radiating around into her stomach, concerning for possible pinched nerve, she just barely had a CT scan month and a half ago was treated for diverticulitis but it sounds like that improved with the back is still bothering her, seems like a lot of her pain is positional so we will treat like back Follow up plan: Return if symptoms worsen or fail to improve.     I discussed the assessment and treatment plan with the patient. The patient was provided an opportunity to ask questions and all were answered. The patient agreed with the plan and demonstrated an understanding of the instructions.   The patient was advised to call back or seek an in-person evaluation if the symptoms worsen or if the condition fails to improve as anticipated.  The above assessment and management plan was discussed with the patient. The patient verbalized understanding of and has agreed to the management plan. Patient is aware to call the clinic if symptoms persist or worsen. Patient is aware when to return to the clinic for a follow-up visit. Patient educated on when it is appropriate to go to the emergency department.    I provided 13 minutes of non-face-to-face time during this encounter.    Worthy Rancher, MD

## 2019-08-13 ENCOUNTER — Other Ambulatory Visit: Payer: Self-pay | Admitting: Family Medicine

## 2019-08-13 DIAGNOSIS — I10 Essential (primary) hypertension: Secondary | ICD-10-CM

## 2019-09-17 ENCOUNTER — Encounter: Payer: Self-pay | Admitting: Family Medicine

## 2019-09-17 ENCOUNTER — Ambulatory Visit (INDEPENDENT_AMBULATORY_CARE_PROVIDER_SITE_OTHER): Payer: Medicare Other | Admitting: Family Medicine

## 2019-09-17 ENCOUNTER — Other Ambulatory Visit: Payer: Self-pay

## 2019-09-17 ENCOUNTER — Ambulatory Visit (INDEPENDENT_AMBULATORY_CARE_PROVIDER_SITE_OTHER): Payer: Medicare Other

## 2019-09-17 VITALS — BP 172/74 | HR 67 | Temp 97.1°F | Ht 65.0 in | Wt 224.2 lb

## 2019-09-17 DIAGNOSIS — I10 Essential (primary) hypertension: Secondary | ICD-10-CM

## 2019-09-17 DIAGNOSIS — M542 Cervicalgia: Secondary | ICD-10-CM

## 2019-09-17 DIAGNOSIS — S161XXA Strain of muscle, fascia and tendon at neck level, initial encounter: Secondary | ICD-10-CM | POA: Diagnosis not present

## 2019-09-17 NOTE — Progress Notes (Signed)
BP (!) 172/74   Pulse 67   Temp (!) 97.1 F (36.2 C)   Ht '5\' 5"'  (1.651 m)   Wt 224 lb 4 oz (101.7 kg)   SpO2 94%   BMI 37.32 kg/m    Subjective:   Patient ID: Anna Bradford, female    DOB: Mar 11, 1933, 86 y.o.   MRN: [id number]  HPI: Anna Bradford is a 86 y.o. female presenting on 09/17/2019 for Neck Pain (right anterior neck)   HPI Patient comes in complaining of right anterior neck pain that is been bothering her over the past few weeks.  She had it earlier in the year 6 months ago but it mostly went away and then has come back.  She says she changed her pillows and is trying to alter things but is still having it.  Patient is in pain today and that is why she says her blood pressure is elevated.  Relevant past medical, surgical, family and social history reviewed and updated as indicated. Interim medical history since our last visit reviewed. Allergies and medications reviewed and updated.  Review of Systems  Constitutional: Negative for chills and fever.  Eyes: Negative for redness and visual disturbance.  Respiratory: Negative for chest tightness and shortness of breath.   Cardiovascular: Negative for chest pain and leg swelling.  Musculoskeletal: Positive for neck pain. Negative for back pain and gait problem.  Skin: Negative for rash.  Neurological: Negative for light-headedness and headaches.  Psychiatric/Behavioral: Negative for agitation and behavioral problems.  All other systems reviewed and are negative.   Per HPI unless specifically indicated above   Allergies as of 09/17/2019      Reactions   Macrolides And Ketolides    Bleeding thru bowels   Codeine Nausea And Vomiting   Sulfa Antibiotics Nausea And Vomiting      Medication List       Accurate as of September 17, 2019 11:26 AM. If you have any questions, ask your nurse or doctor.        STOP taking these medications   metroNIDAZOLE 500 MG tablet Commonly known as: FLAGYL Stopped by: Fransisca Kaufmann  Lenay Lovejoy, MD   ondansetron 4 MG disintegrating tablet Commonly known as: Zofran ODT Stopped by: Fransisca Kaufmann Maleah Rabago, MD     TAKE these medications   albuterol 108 (90 Base) MCG/ACT inhaler Commonly known as: VENTOLIN HFA Inhale 2 puffs into the lungs every 6 (six) hours as needed for wheezing or shortness of breath.   amLODipine 5 MG tablet Commonly known as: NORVASC Take 1 tablet (5 mg total) by mouth daily. Needs to be seen before next refill   aspirin 81 MG tablet Take 81 mg by mouth daily.   Benadryl Allergy 25 MG tablet Generic drug: diphenhydrAMINE Take 25 mg by mouth daily as needed (allergies).   CALCIUM-MAGNESIUM-ZINC-D3 PO Take 1 tablet by mouth daily.   clobetasol cream 0.05 % Commonly known as: TEMOVATE Apply 1 application topically 2 (two) times daily.   famotidine 20 MG tablet Commonly known as: Pepcid Take 1 tablet (20 mg total) by mouth 2 (two) times daily.   ibuprofen 200 MG tablet Commonly known as: ADVIL Take 800 mg by mouth 2 (two) times daily.   losartan-hydrochlorothiazide 100-25 MG tablet Commonly known as: HYZAAR Take 1 tablet by mouth daily.   magnesium hydroxide 400 MG/5ML suspension Commonly known as: MILK OF MAGNESIA Take by mouth as needed for mild constipation.   multivitamin tablet Take 1 tablet by mouth  daily.   nystatin cream Commonly known as: MYCOSTATIN Apply 1 application topically daily.   Potassium 99 MG Tabs Take by mouth.   SYSTANE OP Place 1 drop into both eyes daily as needed (dry eyes).   vitamin C 1000 MG tablet Take 1,000 mg by mouth daily.   Vitamin D-3 25 MCG (1000 UT) Caps Take 2,000 Units by mouth daily.   zinc gluconate 50 MG tablet Take 50 mg by mouth daily.        Objective:   BP (!) 172/74   Pulse 67   Temp (!) 97.1 F (36.2 C)   Ht '5\' 5"'  (1.651 m)   Wt 224 lb 4 oz (101.7 kg)   SpO2 94%   BMI 37.32 kg/m   Wt Readings from Last 3 Encounters:  09/17/19 224 lb 4 oz (101.7 kg)    03/14/19 220 lb (99.8 kg)  02/06/19 224 lb (101.6 kg)    Physical Exam Vitals and nursing note reviewed.  Constitutional:      General: She is not in acute distress.    Appearance: She is well-developed. She is not diaphoretic.  Eyes:     Conjunctiva/sclera: Conjunctivae normal.  Cardiovascular:     Rate and Rhythm: Normal rate and regular rhythm.     Heart sounds: Normal heart sounds. No murmur heard.   Pulmonary:     Effort: Pulmonary effort is normal. No respiratory distress.     Breath sounds: Normal breath sounds. No wheezing.  Musculoskeletal:     Cervical back: Spasms and tenderness (right sided neck tenderness) present.  Skin:    General: Skin is warm and dry.     Findings: No rash.  Neurological:     Mental Status: She is alert and oriented to person, place, and time.     Coordination: Coordination normal.  Psychiatric:        Behavior: Behavior normal.     Neck x-ray: Await final read from radiologist.  Assessment & Plan:   Problem List Items Addressed This Visit      Cardiovascular and Mediastinum   Essential hypertension, benign   Relevant Orders   CBC with Differential/Platelet   CMP14+EGFR   Lipid panel    Other Visit Diagnoses    Strain of neck muscle, initial encounter    -  Primary      Right-sided neck strain, likely muscular but she has had this before so she would like to do an x-ray to see how it is doing.  She does have a muscle relaxer at home she says but has not used it and will take it and will do stretching.  Also recommended to reevaluate her pillow situation.  Also recommended heating pad and regular exercises and stretching because she does a lot of knitting and bending over.  Patient declined physical therapy at this point. Follow up plan: Return in about 3 months (around 12/18/2019), or if symptoms worsen or fail to improve, for Physical exam.  Counseling provided for all of the vaccine components No orders of the defined types  were placed in this encounter.   Caryl Pina, MD [facility] 09/17/2019, 11:26 AM

## 2019-09-17 NOTE — Addendum Note (Signed)
Addended by: Lanier Prude D on: 09/17/2019 11:58 AM   Modules accepted: Orders

## 2019-09-18 LAB — CBC WITH DIFFERENTIAL/PLATELET
Basophils Absolute: 0.1 10*3/uL (ref 0.0–0.2)
Basos: 1 %
EOS (ABSOLUTE): 0.3 10*3/uL (ref 0.0–0.4)
Eos: 4 %
Hematocrit: 44.6 % (ref 34.0–46.6)
Hemoglobin: 15.2 g/dL (ref 11.1–15.9)
Immature Grans (Abs): 0 10*3/uL (ref 0.0–0.1)
Immature Granulocytes: 0 %
Lymphocytes Absolute: 1.9 10*3/uL (ref 0.7–3.1)
Lymphs: 21 %
MCH: 31.3 pg (ref 26.6–33.0)
MCHC: 34.1 g/dL (ref 31.5–35.7)
MCV: 92 fL (ref 79–97)
Monocytes Absolute: 0.7 10*3/uL (ref 0.1–0.9)
Monocytes: 8 %
Neutrophils Absolute: 6 10*3/uL (ref 1.4–7.0)
Neutrophils: 66 %
Platelets: 256 10*3/uL (ref 150–450)
RBC: 4.85 x10E6/uL (ref 3.77–5.28)
RDW: 13.4 % (ref 11.7–15.4)
WBC: 9.1 10*3/uL (ref 3.4–10.8)

## 2019-09-18 LAB — CMP14+EGFR
ALT: 25 IU/L (ref 0–32)
AST: 23 IU/L (ref 0–40)
Albumin/Globulin Ratio: 1.4 (ref 1.2–2.2)
Albumin: 4.1 g/dL (ref 3.6–4.6)
Alkaline Phosphatase: 86 IU/L (ref 48–121)
BUN/Creatinine Ratio: 20 (ref 12–28)
BUN: 17 mg/dL (ref 8–27)
Bilirubin Total: 0.6 mg/dL (ref 0.0–1.2)
CO2: 29 mmol/L (ref 20–29)
Calcium: 9.5 mg/dL (ref 8.7–10.3)
Chloride: 103 mmol/L (ref 96–106)
Creatinine, Ser: 0.83 mg/dL (ref 0.57–1.00)
GFR calc Af Amer: 75 mL/min/{1.73_m2} (ref >59)
GFR calc non Af Amer: 65 mL/min/{1.73_m2} (ref >59)
Globulin, Total: 2.9 g/dL (ref 1.5–4.5)
Glucose: 92 mg/dL (ref 65–99)
Potassium: 4.1 mmol/L (ref 3.5–5.2)
Sodium: 143 mmol/L (ref 134–144)
Total Protein: 7 g/dL (ref 6.0–8.5)

## 2019-09-18 LAB — LIPID PANEL
Chol/HDL Ratio: 4.6 ratio — ABNORMAL HIGH (ref 0.0–4.4)
Cholesterol, Total: 189 mg/dL (ref 100–199)
HDL: 41 mg/dL (ref >39)
LDL Chol Calc (NIH): 114 mg/dL — ABNORMAL HIGH (ref 0–99)
Triglycerides: 191 mg/dL — ABNORMAL HIGH (ref 0–149)
VLDL Cholesterol Cal: 34 mg/dL (ref 5–40)

## 2019-10-16 ENCOUNTER — Other Ambulatory Visit: Payer: Self-pay

## 2019-10-16 ENCOUNTER — Encounter: Payer: Self-pay | Admitting: Orthopaedic Surgery

## 2019-10-16 ENCOUNTER — Ambulatory Visit: Payer: Medicare Other | Admitting: Orthopaedic Surgery

## 2019-10-16 VITALS — Ht 65.0 in | Wt 224.0 lb

## 2019-10-16 DIAGNOSIS — M7061 Trochanteric bursitis, right hip: Secondary | ICD-10-CM

## 2019-10-16 NOTE — Progress Notes (Signed)
PROCEDURE NOTE:  The patient request injection, verbal consent was obtained.  The right trochanteric area of the hip was prepped appropriately after time out was performed.   Sterile technique was observed and injection of 1 cc of Depo-Medrol 40 mg with several cc's of plain xylocaine. Anesthesia was provided by ethyl chloride and a 20-gauge needle was used to inject the hip area. The injection was tolerated well.  A band aid dressing was applied.  The patient was advised to apply ice later today and tomorrow to the injection sight as needed.  See as needed.  Electronically Signed Sanjuana Kava, MD 9/28/202111:18 AM

## 2019-10-16 NOTE — Patient Instructions (Signed)
Use Aspercreme, Biofreeze, Blue Emu or Voltaren gel over the counter 2-3 times daily make sure you rub it in well each time you use it.

## 2019-11-05 DIAGNOSIS — H02886 Meibomian gland dysfunction of left eye, unspecified eyelid: Secondary | ICD-10-CM | POA: Diagnosis not present

## 2019-11-05 DIAGNOSIS — H02883 Meibomian gland dysfunction of right eye, unspecified eyelid: Secondary | ICD-10-CM | POA: Diagnosis not present

## 2019-11-05 DIAGNOSIS — H02055 Trichiasis without entropian left lower eyelid: Secondary | ICD-10-CM | POA: Diagnosis not present

## 2019-11-09 ENCOUNTER — Ambulatory Visit (INDEPENDENT_AMBULATORY_CARE_PROVIDER_SITE_OTHER): Payer: Medicare Other | Admitting: *Deleted

## 2019-11-09 VITALS — Ht 65.0 in | Wt 224.0 lb

## 2019-11-09 DIAGNOSIS — Z0000 Encounter for general adult medical examination without abnormal findings: Secondary | ICD-10-CM | POA: Diagnosis not present

## 2019-11-09 NOTE — Patient Instructions (Signed)
  [place] Maintenance Summary and Written Plan of Care  Ms. Schnieders ,  Thank you for allowing me to perform your Medicare Annual Wellness Visit and for your ongoing commitment to your health.   Health Maintenance & Immunization History Health Maintenance  Topic Date Due  . INFLUENZA VACCINE  08/19/2019  . DEXA SCAN  12/13/2019 (Originally 08/14/2000)  . TETANUS/TDAP  07/04/2022  . COVID-19 Vaccine  Completed  . PNA vac Low Risk Adult  Completed   Immunization History  Administered Date(s) Administered  . Influenza Split 11/09/2016  . Influenza, High Dose Seasonal PF 12/03/2015, 11/28/2017  . Influenza, Seasonal, Injecte, Preservative Fre 10/23/2014  . Influenza,inj,Quad PF,6+ Mos 10/27/2018  . Influenza,trivalent, recombinat, inj, PF 10/22/2016  . Moderna SARS-COVID-2 Vaccination 02/27/2019, 04/03/2019  . Pneumococcal Conjugate-13 12/16/2014  . Pneumococcal Polysaccharide-23 11/14/2004  . Tdap 07/03/2012    These are the patient goals that we discussed: Goals Addressed            This Visit's Progress   . Exercise 150 min/wk Moderate Activity       11/09/2019 AWV Goal: Exercise for General Health   Patient will verbalize understanding of the benefits of increased physical activity:  Exercising regularly is important. It will improve your overall fitness, flexibility, and endurance.  Regular exercise also will improve your overall health. It can help you control your weight, reduce stress, and improve your bone density.  Over the next year, patient will increase physical activity as tolerated with a goal of at least 150 minutes of moderate physical activity per week.   You can tell that you are exercising at a moderate intensity if your heart starts beating faster and you start breathing faster but can still hold a conversation.  Moderate-intensity exercise ideas include:  Walking 1 mile (1.6 km) in about 15  minutes  Biking  Hiking  Golfing  Dancing  Water aerobics  Patient will verbalize understanding of everyday activities that increase physical activity by providing examples like the following: ? Yard work, such as: ? Pushing a Conservation officer, nature ? Raking and bagging leaves ? Washing your car ? Pushing a stroller ? Shoveling snow ? Gardening ? Washing windows or floors  Patient will be able to explain general safety guidelines for exercising:   Before you start a new exercise program, talk with your health care provider.  Do not exercise so much that you hurt yourself, feel dizzy, or get very short of breath.  Wear comfortable clothes and wear shoes with good support.  Drink plenty of water while you exercise to prevent dehydration or heat stroke.  Work out until your breathing and your heartbeat get faster.         This is a list of Health Maintenance Items that are overdue or due now: Health Maintenance Due  Topic Date Due  . INFLUENZA VACCINE  08/19/2019     Orders/Referrals Placed Today: No orders of the defined types were placed in this encounter.  (Contact our referral department at [phone] if you have not spoken with someone about your referral appointment within the next 5 days)    Follow-up Plan

## 2019-11-09 NOTE — Progress Notes (Signed)
MEDICARE ANNUAL WELLNESS VISIT  11/09/2019  Telephone Visit Disclaimer This Medicare AWV was conducted by telephone due to national recommendations for restrictions regarding the COVID-19 Pandemic (e.g. social distancing).  I verified, using two identifiers, that I am speaking with Anna Bradford or their authorized healthcare agent. I discussed the limitations, risks, security, and privacy concerns of performing an evaluation and management service by telephone and the potential availability of an in-person appointment in the future. The patient expressed understanding and agreed to proceed.  Location of Patient: Home Location of Provider (nurse):  Office  Subjective:    Anna Bradford is a 86 y.o. female patient of Dettinger, Fransisca Kaufmann, MD who had a Medicare Annual Wellness Visit today via telephone. Anna Bradford is Retired and lives alone. she has 2 children. she reports that she is socially active and does interact with friends/family regularly. she is not physically active and enjoys sewing.  Patient Care Team: Dettinger, Fransisca Kaufmann, MD as PCP - General (Family Medicine)  Advanced Directives 11/09/2019 03/14/2019 11/07/2018 01/30/2018 01/25/2018 02/06/2015  Does Patient Have a Medical Advance Directive? No No No No No No  Would patient like information on creating a medical advance directive? No - Patient declined No - Patient declined No - Patient declined No - Patient declined No - Patient declined No - patient declined information    Hospital Utilization Over the Past 12 Months: # of hospitalizations or ER visits: 1 # of surgeries: 1  Review of Systems    Patient reports that her overall health is unchanged compared to last year.  History obtained from chart review and the patient General ROS: negative  Patient Reported Readings (BP, Pulse, CBG, Weight, etc) none  Pain Assessment Pain : No/denies pain     Current Medications & Allergies (verified) Allergies as of 11/09/2019       Reactions   Macrolides And Ketolides    Bleeding thru bowels   Codeine Nausea And Vomiting   Sulfa Antibiotics Nausea And Vomiting      Medication List       Accurate as of November 09, 2019 11:36 AM. If you have any questions, ask your nurse or doctor.        albuterol 108 (90 Base) MCG/ACT inhaler Commonly known as: VENTOLIN HFA Inhale 2 puffs into the lungs every 6 (six) hours as needed for wheezing or shortness of breath.   amLODipine 5 MG tablet Commonly known as: NORVASC Take 1 tablet (5 mg total) by mouth daily. Needs to be seen before next refill   aspirin 81 MG tablet Take 81 mg by mouth daily.   Benadryl Allergy 25 MG tablet Generic drug: diphenhydrAMINE Take 25 mg by mouth daily as needed (allergies).   CALCIUM-MAGNESIUM-ZINC-D3 PO Take 1 tablet by mouth daily.   clobetasol cream 0.05 % Commonly known as: TEMOVATE Apply 1 application topically 2 (two) times daily.   famotidine 20 MG tablet Commonly known as: Pepcid Take 1 tablet (20 mg total) by mouth 2 (two) times daily.   ibuprofen 200 MG tablet Commonly known as: ADVIL Take 800 mg by mouth 2 (two) times daily.   losartan-hydrochlorothiazide 100-25 MG tablet Commonly known as: HYZAAR Take 1 tablet by mouth daily.   magnesium hydroxide 400 MG/5ML suspension Commonly known as: MILK OF MAGNESIA Take by mouth as needed for mild constipation.   multivitamin tablet Take 1 tablet by mouth daily.   nystatin cream Commonly known as: MYCOSTATIN Apply 1 application topically daily.  Potassium 99 MG Tabs Take by mouth.   SYSTANE OP Place 1 drop into both eyes daily as needed (dry eyes).   vitamin C 1000 MG tablet Take 1,000 mg by mouth daily.   Vitamin D-3 25 MCG (1000 UT) Caps Take 2,000 Units by mouth daily.   zinc gluconate 50 MG tablet Take 50 mg by mouth daily.       History (reviewed): Past Medical History:  Diagnosis Date  . Arthritis   . Cancer [facility]) 1996   left breast  .  Essential hypertension, benign   . GERD (gastroesophageal reflux disease)    Past Surgical History:  Procedure Laterality Date  . ABDOMINAL HYSTERECTOMY    . Absess Off Intestine    . cataract surgery Left 08-10-2011  . cataract surgery Right 11-01-2011  . CHOLECYSTECTOMY     1972  . COLONOSCOPY N/A 02/06/2015   Procedure: COLONOSCOPY;  Surgeon: Rogene Houston, MD;  Location: [facility] ENDO SUITE;  Service: Endoscopy;  Laterality: N/A;  1015  . GALLBLADDER SURGERY     Rupture Lower Stomach  . HERNIA REPAIR    . Hysterectomy ---- unknown     1980  . [place] Tumor    . Left Breast Masectomy     1986  . MASS EXCISION N/A 01/30/2018   Procedure: EXCISION MASS 3CM ON BACK AND 3CM ON ABDOMEN;  Surgeon: Virl Cagey, MD;  Location: [facility] ORS;  Service: General;  Laterality: N/A;  . Right Ankle Surgery     Family History  Problem Relation Age of Onset  . Cancer Mother   . Coronary artery disease Mother   . Heart disease Sister    Social History   Socioeconomic History  . Marital status: Widowed    Spouse name: Not on file  . Number of children: 2  . Years of education: 12-failed the 4th grade and had to repeat  . Highest education level: 11th grade  Occupational History  . Occupation: retired  Tobacco Use  . Smoking status: Never Smoker  . Smokeless tobacco: Never Used  Vaping Use  . Vaping Use: Never used  Substance and Sexual Activity  . Alcohol use: No  . Drug use: No  . Sexual activity: Not Currently    Birth control/protection: Surgical  Other Topics Concern  . Not on file  Social History Narrative  . Not on file   Social Determinants of Health   Financial Resource Strain: Low Risk   . Difficulty of Paying Living Expenses: Not hard at all  Food Insecurity: No Food Insecurity  . Worried About Charity fundraiser in the Last Year: Never true  . Ran Out of Food in the Last Year: Never true  Transportation Needs: No Transportation Needs  . Lack of Transportation  (Medical): No  . Lack of Transportation (Non-Medical): No  Physical Activity: Inactive  . Days of Exercise per Week: 0 days  . Minutes of Exercise per Session: 0 min  Stress: No Stress Concern Present  . Feeling of Stress : Not at all  Social Connections: Moderately Integrated  . Frequency of Communication with Friends and Family: More than three times a week  . Frequency of Social Gatherings with Friends and Family: More than three times a week  . Attends Religious Services: More than 4 times per year  . Active Member of Clubs or Organizations: Yes  . Attends Archivist Meetings: More than 4 times per year  . Marital Status: Widowed  Activities of Daily Living In your present state of health, do you have any difficulty performing the following activities: 11/09/2019  Hearing? N  Vision? N  Difficulty concentrating or making decisions? N  Walking or climbing stairs? N  Dressing or bathing? N  Doing errands, shopping? N  Preparing Food and eating ? N  Using the Toilet? N  In the past six months, have you accidently leaked urine? N  Do you have problems with loss of bowel control? N  Managing your Medications? N  Managing your Finances? N  Housekeeping or managing your Housekeeping? N  Some recent data might be hidden    Patient Education/ Literacy How often do you need to have someone help you when you read instructions, pamphlets, or other written materials from your doctor or pharmacy?: 1 - Never What is the last grade level you completed in school?: 11th Grade  Exercise Current Exercise Habits: The patient does not participate in regular exercise at present, Exercise limited by: None identified  Diet Patient reports consuming 3 meals a day and 3 snack(s) a day Patient reports that her primary diet is: Regular Patient reports that she does have regular access to food.   Depression Screen PHQ 2/9 Scores 11/09/2019 09/17/2019 12/13/2018 11/07/2018 10/31/2018  09/11/2018  PHQ - 2 Score 0 0 0 0 0 0     Fall Risk Fall Risk  11/09/2019 09/17/2019 12/13/2018 11/07/2018 10/31/2018  Falls in the past year? 1 1 0 1 0  Number falls in past yr: 0 0 - 0 -  Injury with Fall? 0 0 - 0 -  Risk for fall due to : History of fall(s) Impaired balance/gait - - -  Follow up Falls evaluation completed Falls evaluation completed - Falls prevention discussed -  Comment - - - Get rid of all throw rugs in the house, adequate lighting in the walkways and grab bars in the bathroom -     Objective:  Anna Bradford seemed alert and oriented and she participated appropriately during our telephone visit.  Blood Pressure Weight BMI  BP Readings from Last 3 Encounters:  09/17/19 (!) 172/74  03/14/19 135/69  12/13/18 (!) 179/93   Wt Readings from Last 3 Encounters:  11/09/19 223 lb 15.8 oz (101.6 kg)  10/16/19 224 lb (101.6 kg)  09/17/19 224 lb 4 oz (101.7 kg)   BMI Readings from Last 1 Encounters:  11/09/19 37.27 kg/m    *Unable to obtain current vital signs, weight, and BMI due to telephone visit type  Hearing/Vision  . Bonne did not seem to have difficulty with hearing/understanding during the telephone conversation . Reports that she has not had a formal eye exam by an eye care professional within the past year . Reports that she has not had a formal hearing evaluation within the past year *Unable to fully assess hearing and vision during telephone visit type  Cognitive Function: 6CIT Screen 11/09/2019 11/07/2018  What Year? 0 points 0 points  What month? 0 points 0 points  What time? 0 points 0 points  Count back from 20 0 points 0 points  Months in reverse 0 points 0 points  Repeat phrase 0 points 0 points  Total Score 0 0   (Normal:0-7, Significant for Dysfunction: >8)  Normal Cognitive Function Screening: Yes   Immunization & Health Maintenance Record Immunization History  Administered Date(s) Administered  . Influenza Split 11/09/2016  .  Influenza, High Dose Seasonal PF 12/03/2015, 11/28/2017  . Influenza, Seasonal, Injecte, Preservative  Fre 10/23/2014  . Influenza,inj,Quad PF,6+ Mos 10/27/2018  . Influenza,trivalent, recombinat, inj, PF 10/22/2016  . Moderna SARS-COVID-2 Vaccination 02/27/2019, 04/03/2019  . Pneumococcal Conjugate-13 12/16/2014  . Pneumococcal Polysaccharide-23 11/14/2004  . Tdap 07/03/2012    Health Maintenance  Topic Date Due  . INFLUENZA VACCINE  08/19/2019  . DEXA SCAN  12/13/2019 (Originally 08/14/2000)  . TETANUS/TDAP  07/04/2022  . COVID-19 Vaccine  Completed  . PNA vac Low Risk Adult  Completed       Assessment  This is a routine wellness examination for Anna Bradford.  Health Maintenance: Due or Overdue Health Maintenance Due  Topic Date Due  . INFLUENZA VACCINE  08/19/2019    Anna Bradford does not need a referral for Community Assistance: Care Management:   no Social Work:    no Prescription Assistance:  no Nutrition/Diabetes Education:  no   Plan:  Personalized Goals Goals Addressed            This Visit's Progress   . Exercise 150 min/wk Moderate Activity       11/09/2019 AWV Goal: Exercise for General Health   Patient will verbalize understanding of the benefits of increased physical activity:  Exercising regularly is important. It will improve your overall fitness, flexibility, and endurance.  Regular exercise also will improve your overall health. It can help you control your weight, reduce stress, and improve your bone density.  Over the next year, patient will increase physical activity as tolerated with a goal of at least 150 minutes of moderate physical activity per week.   You can tell that you are exercising at a moderate intensity if your heart starts beating faster and you start breathing faster but can still hold a conversation.  Moderate-intensity exercise ideas include:  Walking 1 mile (1.6 km) in about 15  minutes  Biking  Hiking  Golfing  Dancing  Water aerobics  Patient will verbalize understanding of everyday activities that increase physical activity by providing examples like the following: ? Yard work, such as: ? Pushing a Conservation officer, nature ? Raking and bagging leaves ? Washing your car ? Pushing a stroller ? Shoveling snow ? Gardening ? Washing windows or floors  Patient will be able to explain general safety guidelines for exercising:   Before you start a new exercise program, talk with your health care provider.  Do not exercise so much that you hurt yourself, feel dizzy, or get very short of breath.  Wear comfortable clothes and wear shoes with good support.  Drink plenty of water while you exercise to prevent dehydration or heat stroke.  Work out until your breathing and your heartbeat get faster.       Personalized Health Maintenance & Screening Recommendations  Influenza vaccine  Lung Cancer Screening Recommended: no (Low Dose CT Chest recommended if Age 98-80 years, 30 pack-year currently smoking OR have quit w/in past 15 years) Hepatitis C Screening recommended: no HIV Screening recommended: no  Advanced Directives: Written information was not prepared per patient's request.  Referrals & Orders No orders of the defined types were placed in this encounter.   Follow-up Plan . Follow-up with Dettinger, Fransisca Kaufmann, MD as planned    I have personally reviewed and noted the following in the patient's chart:   . Medical and social history . Use of alcohol, tobacco or illicit drugs  . Current medications and supplements . Functional ability and status . Nutritional status . Physical activity . Advanced directives . List of other physicians .  Hospitalizations, surgeries, and ER visits in previous 12 months . Vitals . Screenings to include cognitive, depression, and falls . Referrals and appointments  In addition, I have reviewed and discussed with Anna Bradford certain preventive protocols, quality metrics, and best practice recommendations. A written personalized care plan for preventive services as well as general preventive health recommendations is available and can be mailed to the patient at her request.      Wardell Heath, LPN  [id number]     AVS Printed and mailed to patient

## 2019-11-23 ENCOUNTER — Other Ambulatory Visit: Payer: Self-pay

## 2019-11-23 ENCOUNTER — Encounter: Payer: Self-pay | Admitting: Family Medicine

## 2019-11-23 ENCOUNTER — Ambulatory Visit (INDEPENDENT_AMBULATORY_CARE_PROVIDER_SITE_OTHER): Payer: Medicare Other | Admitting: Family Medicine

## 2019-11-23 VITALS — BP 170/80 | HR 78 | Temp 97.0°F | Ht 65.0 in | Wt 224.0 lb

## 2019-11-23 DIAGNOSIS — I10 Essential (primary) hypertension: Secondary | ICD-10-CM | POA: Diagnosis not present

## 2019-11-23 DIAGNOSIS — K219 Gastro-esophageal reflux disease without esophagitis: Secondary | ICD-10-CM | POA: Diagnosis not present

## 2019-11-23 DIAGNOSIS — K5792 Diverticulitis of intestine, part unspecified, without perforation or abscess without bleeding: Secondary | ICD-10-CM | POA: Diagnosis not present

## 2019-11-23 DIAGNOSIS — R110 Nausea: Secondary | ICD-10-CM

## 2019-11-23 MED ORDER — FAMOTIDINE 20 MG PO TABS: 20.0000 mg | ORAL_TABLET | Freq: Two times a day (BID) | ORAL | 3 refills | Status: DC

## 2019-11-23 MED ORDER — AMLODIPINE BESYLATE 5 MG PO TABS: 5.0000 mg | ORAL_TABLET | Freq: Every day | ORAL | 3 refills | Status: DC

## 2019-11-23 MED ORDER — LOSARTAN POTASSIUM-HCTZ 100-25 MG PO TABS: 1.0000 | ORAL_TABLET | Freq: Every day | ORAL | 3 refills | Status: DC

## 2019-11-23 NOTE — Progress Notes (Signed)
Feels like he needs a stronger  BP (!) 170/80   Pulse 78   Temp (!) 97 F (36.1 C)   Ht '5\' 5"'  (1.651 m)   Wt 224 lb (101.6 kg)   SpO2 93%   BMI 37.28 kg/m    Subjective:   Patient ID: Anna Bradford, female    DOB: 29-Mar-1933, 86 y.o.   MRN: [id number]  HPI: Anna Bradford is a 86 y.o. female presenting on 11/23/2019 for Hypertension (needs refills)   HPI Hypertension Patient is currently on amlodipine and losartan hydrochlorothiazide, and their blood pressure today is 170/80 and 158/63, she is had a lot of stress recently with recent passing away and then her granddaughter is with her today because she was thinking about suicide and so that stressed her out a lot today and that is why her blood pressure is up.  She has been running good at home.. Patient denies any lightheadedness or dizziness. Patient denies headaches, blurred vision, chest pains, shortness of breath, or weakness. Denies any side effects from medication and is content with current medication.   GERD Patient is currently on pepcid.  She denies any major symptoms or abdominal pain or belching or burping. She denies any blood in her stool or lightheadedness or dizziness.   Patient's hypertension are more complicated by the patient's morbid obesity.  Discussed weight loss and lifestyle modification and exercise with the patient.   Relevant past medical, surgical, family and social history reviewed and updated as indicated. Interim medical history since our last visit reviewed. Allergies and medications reviewed and updated.  Review of Systems  Constitutional: Negative for chills and fever.  Eyes: Negative for visual disturbance.  Respiratory: Negative for chest tightness and shortness of breath.   Cardiovascular: Negative for chest pain and leg swelling.  Musculoskeletal: Negative for back pain and gait problem.  Skin: Negative for rash.  Neurological: Negative for light-headedness and headaches.    Psychiatric/Behavioral: Negative for agitation and behavioral problems.  All other systems reviewed and are negative.   Per HPI unless specifically indicated above   Allergies as of 11/23/2019      Reactions   Macrolides And Ketolides    Bleeding thru bowels   Codeine Nausea And Vomiting   Sulfa Antibiotics Nausea And Vomiting      Medication List       Accurate as of November 23, 2019 11:20 AM. If you have any questions, ask your nurse or doctor.        albuterol 108 (90 Base) MCG/ACT inhaler Commonly known as: VENTOLIN HFA Inhale 2 puffs into the lungs every 6 (six) hours as needed for wheezing or shortness of breath.   amLODipine 5 MG tablet Commonly known as: NORVASC Take 1 tablet (5 mg total) by mouth daily. Needs to be seen before next refill   aspirin 81 MG tablet Take 81 mg by mouth daily.   Benadryl Allergy 25 MG tablet Generic drug: diphenhydrAMINE Take 25 mg by mouth daily as needed (allergies).   CALCIUM-MAGNESIUM-ZINC-D3 PO Take 1 tablet by mouth daily.   clobetasol cream 0.05 % Commonly known as: TEMOVATE Apply 1 application topically 2 (two) times daily.   famotidine 20 MG tablet Commonly known as: Pepcid Take 1 tablet (20 mg total) by mouth 2 (two) times daily.   ibuprofen 200 MG tablet Commonly known as: ADVIL Take 800 mg by mouth 2 (two) times daily.   losartan-hydrochlorothiazide 100-25 MG tablet Commonly known as: HYZAAR Take 1 tablet by  mouth daily.   magnesium hydroxide 400 MG/5ML suspension Commonly known as: MILK OF MAGNESIA Take by mouth as needed for mild constipation.   multivitamin tablet Take 1 tablet by mouth daily.   nystatin cream Commonly known as: MYCOSTATIN Apply 1 application topically daily.   Potassium 99 MG Tabs Take by mouth.   SYSTANE OP Place 1 drop into both eyes daily as needed (dry eyes).   vitamin C 1000 MG tablet Take 1,000 mg by mouth daily.   Vitamin D-3 25 MCG (1000 UT) Caps Take 2,000 Units  by mouth daily.   zinc gluconate 50 MG tablet Take 50 mg by mouth daily.        Objective:   BP (!) 170/80   Pulse 78   Temp (!) 97 F (36.1 C)   Ht '5\' 5"'  (1.651 m)   Wt 224 lb (101.6 kg)   SpO2 93%   BMI 37.28 kg/m   Wt Readings from Last 3 Encounters:  11/23/19 224 lb (101.6 kg)  11/09/19 223 lb 15.8 oz (101.6 kg)  10/16/19 224 lb (101.6 kg)    Physical Exam Vitals and nursing note reviewed.  Constitutional:      General: She is not in acute distress.    Appearance: She is well-developed. She is not diaphoretic.  Eyes:     Conjunctiva/sclera: Conjunctivae normal.  Cardiovascular:     Rate and Rhythm: Normal rate and regular rhythm.     Heart sounds: Normal heart sounds. No murmur heard.   Pulmonary:     Effort: Pulmonary effort is normal. No respiratory distress.     Breath sounds: Normal breath sounds. No wheezing.  Musculoskeletal:        General: No tenderness. Normal range of motion.  Skin:    General: Skin is warm and dry.     Findings: No rash.  Neurological:     Mental Status: She is alert and oriented to person, place, and time.     Coordination: Coordination normal.  Psychiatric:        Behavior: Behavior normal.       Assessment & Plan:   Problem List Items Addressed This Visit      Cardiovascular and Mediastinum   Essential hypertension, benign   Relevant Medications   losartan-hydrochlorothiazide (HYZAAR) 100-25 MG tablet   amLODipine (NORVASC) 5 MG tablet   Other Relevant Orders   CMP14+EGFR     Digestive   GERD (gastroesophageal reflux disease) - Primary   Relevant Medications   famotidine (PEPCID) 20 MG tablet     Other   Severe obesity (BMI 35.0-39.9) with comorbidity ([place])    Other Visit Diagnoses    Diverticulitis       Relevant Medications   famotidine (PEPCID) 20 MG tablet   Nausea       Relevant Medications   famotidine (PEPCID) 20 MG tablet      Continue current medication, will monitor blood pressure closely.   Patient will check blood pressure at home and bring them with her the next time she comes.  She had a lot of stress so we are not going to adjust her medications today. Follow up plan: Return in about 3 months (around 02/23/2020), or if symptoms worsen or fail to improve, for Hypertension.  Counseling provided for all of the vaccine components Orders Placed This Encounter  Procedures  . [place] Nikoletta Varma, MD [facility] 11/23/2019, 11:20 AM

## 2019-12-19 ENCOUNTER — Other Ambulatory Visit: Payer: Self-pay

## 2019-12-19 ENCOUNTER — Ambulatory Visit (INDEPENDENT_AMBULATORY_CARE_PROVIDER_SITE_OTHER): Payer: Medicare Other | Admitting: Family Medicine

## 2019-12-19 ENCOUNTER — Ambulatory Visit (INDEPENDENT_AMBULATORY_CARE_PROVIDER_SITE_OTHER): Payer: Medicare Other

## 2019-12-19 ENCOUNTER — Encounter: Payer: Self-pay | Admitting: Family Medicine

## 2019-12-19 VITALS — BP 176/63 | HR 66 | Temp 97.5°F | Ht 61.0 in | Wt 225.0 lb

## 2019-12-19 DIAGNOSIS — K219 Gastro-esophageal reflux disease without esophagitis: Secondary | ICD-10-CM

## 2019-12-19 DIAGNOSIS — Z780 Asymptomatic menopausal state: Secondary | ICD-10-CM

## 2019-12-19 DIAGNOSIS — Z0001 Encounter for general adult medical examination with abnormal findings: Secondary | ICD-10-CM | POA: Diagnosis not present

## 2019-12-19 DIAGNOSIS — I10 Essential (primary) hypertension: Secondary | ICD-10-CM | POA: Diagnosis not present

## 2019-12-19 DIAGNOSIS — Z0000 Encounter for general adult medical examination without abnormal findings: Secondary | ICD-10-CM

## 2019-12-19 DIAGNOSIS — S161XXD Strain of muscle, fascia and tendon at neck level, subsequent encounter: Secondary | ICD-10-CM

## 2019-12-19 MED ORDER — AMLODIPINE BESYLATE 10 MG PO TABS: 10.0000 mg | ORAL_TABLET | Freq: Every day | ORAL | 3 refills | Status: DC

## 2019-12-19 MED ORDER — BACLOFEN 10 MG PO TABS: 10.0000 mg | ORAL_TABLET | Freq: Every evening | ORAL | 1 refills | Status: DC | PRN

## 2019-12-19 NOTE — Progress Notes (Signed)
BP (!) 176/63   Pulse 66   Temp (!) 97.5 F (36.4 C)   Ht 5\' 1"  (1.549 m)   Wt 225 lb (102.1 kg)   SpO2 95%   BMI 42.51 kg/m    Subjective:   Patient ID: Anna Bradford, female    DOB: 1933/06/14, 86 y.o.   MRN: [id number]  HPI: AMBERT VIRRUETA is a 86 y.o. female presenting on 12/19/2019 for Medical Management of Chronic Issues (CPE)   HPI Patient is coming in today for physical exam and checkup.  She just had blood work done 3 months ago which looks good except for the cholesterol. She says her biggest complaint today is the neck is still bothering her on the right side as the muscles in the neck and hurts more when she turns or twists on that side.  Sometimes she feels spasms going all the way up the back of her neck towards her ear and her jaw and then sometimes she will feel it going down towards her back as well.  She denies any numbness or weakness in either arm.  She does say she has some dental problems initially on those right side but does not think that is related to that.  She had an x-ray in the past that was normal. Patient denies any chest pain, shortness of breath, headaches or vision issues, abdominal complaints, diarrhea, nausea, vomiting.   Relevant past medical, surgical, family and social history reviewed and updated as indicated. Interim medical history since our last visit reviewed. Allergies and medications reviewed and updated.  Review of Systems  Constitutional: Negative for chills and fever.  HENT: Negative for congestion, ear discharge and ear pain.   Eyes: Negative for redness and visual disturbance.  Respiratory: Negative for chest tightness and shortness of breath.   Cardiovascular: Negative for chest pain and leg swelling.  Genitourinary: Negative for difficulty urinating and dysuria.  Musculoskeletal: Positive for arthralgias, myalgias and neck pain. Negative for back pain and gait problem.  Skin: Negative for rash.  Neurological: Negative for  light-headedness and headaches.  Psychiatric/Behavioral: Negative for agitation and behavioral problems.  All other systems reviewed and are negative.   Per HPI unless specifically indicated above   Allergies as of 12/19/2019      Reactions   Macrolides And Ketolides    Bleeding thru bowels   Codeine Nausea And Vomiting   Sulfa Antibiotics Nausea And Vomiting      Medication List       Accurate as of December 19, 2019 10:20 AM. If you have any questions, ask your nurse or doctor.        albuterol 108 (90 Base) MCG/ACT inhaler Commonly known as: VENTOLIN HFA Inhale 2 puffs into the lungs every 6 (six) hours as needed for wheezing or shortness of breath.   amLODipine 10 MG tablet Commonly known as: NORVASC Take 1 tablet (10 mg total) by mouth daily. Needs to be seen before next refill What changed:   medication strength  how much to take Changed by: Fransisca Kaufmann Hiilani Jetter, MD   aspirin 81 MG tablet Take 81 mg by mouth daily.   baclofen 10 MG tablet Commonly known as: LIORESAL Take 1 tablet (10 mg total) by mouth at bedtime as needed for muscle spasms. Started by: Worthy Rancher, MD   Benadryl Allergy 25 MG tablet Generic drug: diphenhydrAMINE Take 25 mg by mouth daily as needed (allergies).   CALCIUM-MAGNESIUM-ZINC-D3 PO Take 1 tablet by mouth daily.  clobetasol cream 0.05 % Commonly known as: TEMOVATE Apply 1 application topically 2 (two) times daily.   famotidine 20 MG tablet Commonly known as: Pepcid Take 1 tablet (20 mg total) by mouth 2 (two) times daily.   ibuprofen 200 MG tablet Commonly known as: ADVIL Take 800 mg by mouth 2 (two) times daily.   losartan-hydrochlorothiazide 100-25 MG tablet Commonly known as: HYZAAR Take 1 tablet by mouth daily.   magnesium hydroxide 400 MG/5ML suspension Commonly known as: MILK OF MAGNESIA Take by mouth as needed for mild constipation.   multivitamin tablet Take 1 tablet by mouth daily.   nystatin  cream Commonly known as: MYCOSTATIN Apply 1 application topically daily.   Potassium 99 MG Tabs Take by mouth.   SYSTANE OP Place 1 drop into both eyes daily as needed (dry eyes).   vitamin C 1000 MG tablet Take 1,000 mg by mouth daily.   Vitamin D-3 25 MCG (1000 UT) Caps Take 2,000 Units by mouth daily.   zinc gluconate 50 MG tablet Take 50 mg by mouth daily.        Objective:   BP (!) 176/63   Pulse 66   Temp (!) 97.5 F (36.4 C)   Ht 5\' 1"  (1.549 m)   Wt 225 lb (102.1 kg)   SpO2 95%   BMI 42.51 kg/m   Wt Readings from Last 3 Encounters:  12/19/19 225 lb (102.1 kg)  11/23/19 224 lb (101.6 kg)  11/09/19 223 lb 15.8 oz (101.6 kg)    Physical Exam Vitals and nursing note reviewed.  Constitutional:      General: She is not in acute distress.    Appearance: She is well-developed. She is not diaphoretic.  HENT:     Right Ear: Tympanic membrane and ear canal normal.     Left Ear: Tympanic membrane and ear canal normal.     Mouth/Throat:     Mouth: Mucous membranes are moist.     Pharynx: Oropharynx is clear. No oropharyngeal exudate or posterior oropharyngeal erythema.  Eyes:     Extraocular Movements: Extraocular movements intact.     Conjunctiva/sclera: Conjunctivae normal.     Pupils: Pupils are equal, round, and reactive to light.  Cardiovascular:     Rate and Rhythm: Normal rate and regular rhythm.     Heart sounds: Normal heart sounds. No murmur heard.   Pulmonary:     Effort: Pulmonary effort is normal. No respiratory distress.     Breath sounds: Normal breath sounds. No wheezing.  Abdominal:     General: Abdomen is flat. Bowel sounds are normal. There is no distension.     Tenderness: There is no abdominal tenderness. There is no right CVA tenderness, left CVA tenderness, guarding or rebound.  Musculoskeletal:        General: No tenderness. Normal range of motion.     Cervical back: No rigidity or torticollis. Pain with movement (Pain on the right  side of neck with movement in the muscles on that side) and muscular tenderness present. No spinous process tenderness. Normal range of motion.  Skin:    General: Skin is warm and dry.     Findings: No rash.  Neurological:     Mental Status: She is alert and oriented to person, place, and time.     Coordination: Coordination normal.  Psychiatric:        Behavior: Behavior normal.       Assessment & Plan:   Problem List Items Addressed This  Visit      Cardiovascular and Mediastinum   Essential hypertension, benign   Relevant Medications   amLODipine (NORVASC) 10 MG tablet     Digestive   GERD (gastroesophageal reflux disease)     Other   Severe obesity (BMI 35.0-39.9) with comorbidity ([place])    Other Visit Diagnoses    Well adult exam    -  Primary   Postmenopausal       Relevant Orders   DG [facility] DEXA   Strain of neck muscle, subsequent encounter       Relevant Medications   baclofen (LIORESAL) 10 MG tablet   Other Relevant Orders   Ambulatory referral to Physical Therapy      Will increase blood pressure medicine to 10 mg on the amlodipine.  She will double up on what she has left. Follow up plan: Return in about 3 months (around 03/18/2020), or if symptoms worsen or fail to improve, for Hypertension recheck.  Counseling provided for all of the vaccine components Orders Placed This Encounter  Procedures  . DG [facility] DEXA  . Ambulatory referral to Physical Therapy    Caryl Pina, MD [facility] 12/19/2019, 10:20 AM

## 2019-12-24 DIAGNOSIS — M810 Age-related osteoporosis without current pathological fracture: Secondary | ICD-10-CM | POA: Diagnosis not present

## 2020-01-23 ENCOUNTER — Other Ambulatory Visit: Payer: Self-pay | Admitting: *Deleted

## 2020-01-23 DIAGNOSIS — I10 Essential (primary) hypertension: Secondary | ICD-10-CM

## 2020-01-23 MED ORDER — AMLODIPINE BESYLATE 10 MG PO TABS: 10.0000 mg | ORAL_TABLET | Freq: Every day | ORAL | 3 refills | Status: DC

## 2020-01-24 ENCOUNTER — Other Ambulatory Visit: Payer: Self-pay | Admitting: Family

## 2020-01-24 DIAGNOSIS — L900 Lichen sclerosus et atrophicus: Secondary | ICD-10-CM

## 2020-01-24 DIAGNOSIS — N898 Other specified noninflammatory disorders of vagina: Secondary | ICD-10-CM

## 2020-02-11 ENCOUNTER — Telehealth: Payer: Self-pay

## 2020-02-11 NOTE — Telephone Encounter (Signed)
Pt aware of provider feedback and voiced understanding. 

## 2020-02-11 NOTE — Telephone Encounter (Signed)
Have her go back to 5 mg for now and monitor bp and we will try something else in the future

## 2020-02-19 DIAGNOSIS — R601 Generalized edema: Secondary | ICD-10-CM | POA: Diagnosis not present

## 2020-02-19 DIAGNOSIS — M7662 Achilles tendinitis, left leg: Secondary | ICD-10-CM | POA: Diagnosis not present

## 2020-02-19 DIAGNOSIS — M79672 Pain in left foot: Secondary | ICD-10-CM | POA: Diagnosis not present

## 2020-03-04 DIAGNOSIS — M7662 Achilles tendinitis, left leg: Secondary | ICD-10-CM | POA: Diagnosis not present

## 2020-03-07 ENCOUNTER — Ambulatory Visit (INDEPENDENT_AMBULATORY_CARE_PROVIDER_SITE_OTHER): Payer: Medicare Other

## 2020-03-07 ENCOUNTER — Other Ambulatory Visit: Payer: Self-pay

## 2020-03-07 ENCOUNTER — Encounter: Payer: Self-pay | Admitting: Family Medicine

## 2020-03-07 ENCOUNTER — Ambulatory Visit (INDEPENDENT_AMBULATORY_CARE_PROVIDER_SITE_OTHER): Payer: Medicare Other | Admitting: Family Medicine

## 2020-03-07 VITALS — BP 135/68 | HR 88 | Temp 97.0°F | Resp 20 | Ht 61.0 in | Wt 229.4 lb

## 2020-03-07 DIAGNOSIS — R202 Paresthesia of skin: Secondary | ICD-10-CM

## 2020-03-07 DIAGNOSIS — G2581 Restless legs syndrome: Secondary | ICD-10-CM

## 2020-03-07 DIAGNOSIS — M1712 Unilateral primary osteoarthritis, left knee: Secondary | ICD-10-CM | POA: Diagnosis not present

## 2020-03-07 MED ORDER — ROPINIROLE HCL 1 MG PO TABS: 1.0000 mg | ORAL_TABLET | Freq: Every day | ORAL | 5 refills | Status: DC

## 2020-03-07 NOTE — Progress Notes (Signed)
Subjective:  Patient ID: Anna Bradford, female    DOB: 10-04-1933  Age: 87 y.o. MRN: [id number]  CC: Left knee pain and Stinging and burning in right leg/foot   HPI Anna Bradford presents for leg cramps.  She cannot tolerate baclofen because it made her too weak.  She says it made her body quit walker.  She says she is having cramping behind the left knee and spasms throughout the left lower extremity.  She is having some stinging and burning also in the right lower extremity.  Primarily the cramps are at night.  All of this started in December of this past year 2 to 3 months ago.  She saw Dr. Irving Shows for swelling in her feet 3 times on the first 10th and 15th of this month..  She notes that he was seeing her for swelling in the foot and she is still taking the amlodipine and understands now that it was swelling caused by amlodipine.  Depression screen [facility] 2/9 03/07/2020 12/19/2019 11/09/2019  Decreased Interest 0 0 0  Down, Depressed, Hopeless 0 0 0  PHQ - 2 Score 0 0 0    History Ralene has a past medical history of Arthritis, Cancer ([place]) (1996), Essential hypertension, benign, and GERD (gastroesophageal reflux disease).   She has a past surgical history that includes Gallbladder surgery; Hysterectomy ---- unknown; Lapoma Tumor; Right Ankle Surgery; Left Breast Masectomy; Hernia repair; Absess Off Intestine; cataract surgery (Left, 08-10-2011); cataract surgery (Right, 11-01-2011); Cholecystectomy; Colonoscopy (N/A, 02/06/2015); Mass excision (N/A, 01/30/2018); and Abdominal hysterectomy.   Her family history includes Cancer in her mother; Coronary artery disease in her mother; Heart disease in her sister.She reports that she has never smoked. She has never used smokeless tobacco. She reports that she does not drink alcohol and does not use drugs.    ROS Review of Systems  Constitutional: Negative.   HENT: Negative.   Eyes: Negative for visual disturbance.  Respiratory: Negative for  shortness of breath.   Cardiovascular: Negative for chest pain.  Gastrointestinal: Negative for abdominal pain.  Musculoskeletal: Negative for arthralgias.    Objective:  BP 135/68   Pulse 88   Temp (!) 97 F (36.1 C) (Temporal)   Resp 20   Ht '5\' 1"'  (1.549 m)   Wt 229 lb 6 oz (104 kg)   SpO2 98%   BMI 43.34 kg/m   BP Readings from Last 3 Encounters:  03/07/20 135/68  12/19/19 (!) 176/63  11/23/19 (!) 170/80    Wt Readings from Last 3 Encounters:  03/07/20 229 lb 6 oz (104 kg)  12/19/19 225 lb (102.1 kg)  11/23/19 224 lb (101.6 kg)     Physical Exam Constitutional:      General: She is not in acute distress.    Appearance: She is well-developed.  HENT:     Head: Normocephalic and atraumatic.  Eyes:     Conjunctiva/sclera: Conjunctivae normal.     Pupils: Pupils are equal, round, and reactive to light.  Neck:     Thyroid: No thyromegaly.  Cardiovascular:     Rate and Rhythm: Normal rate and regular rhythm.     Heart sounds: Normal heart sounds. No murmur heard.   Pulmonary:     Effort: Pulmonary effort is normal. No respiratory distress.     Breath sounds: Normal breath sounds. No wheezing or rales.  Abdominal:     General: Bowel sounds are normal. There is no distension.     Palpations: Abdomen is  soft.     Tenderness: There is no abdominal tenderness.  Musculoskeletal:        General: Normal range of motion.     Cervical back: Normal range of motion and neck supple.  Lymphadenopathy:     Cervical: No cervical adenopathy.  Skin:    General: Skin is warm and dry.  Neurological:     Mental Status: She is alert and oriented to person, place, and time.  Psychiatric:        Behavior: Behavior normal.        Thought Content: Thought content normal.        Judgment: Judgment normal.       Assessment & Plan:   Ikia was seen today for left knee pain and stinging and burning in right leg/foot.  Diagnoses and all orders for this visit:  Restless leg  syndrome -     DG Knee 1-2 Views Left; Future -     CBC with Differential/Platelet -     CMP14+EGFR  Paresthesias -     DG Knee 1-2 Views Left; Future -     CBC with Differential/Platelet -     CMP14+EGFR  Other orders -     rOPINIRole (REQUIP) 1 MG tablet; Take 1 tablet (1 mg total) by mouth at bedtime. For leg cramps       I am having Shakeita N. Cloward start on rOPINIRole. I am also having her maintain her multivitamin, diphenhydrAMINE, vitamin C, Multiple Minerals-Vitamins (CALCIUM-MAGNESIUM-ZINC-D3 PO), aspirin, zinc gluconate, Vitamin D-3, ibuprofen, Polyethyl Glycol-Propyl Glycol (SYSTANE OP), nystatin cream, Potassium, magnesium hydroxide, albuterol, losartan-hydrochlorothiazide, famotidine, baclofen, amLODipine, and clobetasol cream.  Allergies as of 03/07/2020      Reactions   Macrolides And Ketolides    Bleeding thru bowels   Codeine Nausea And Vomiting   Sulfa Antibiotics Nausea And Vomiting      Medication List       Accurate as of March 07, 2020 11:59 PM. If you have any questions, ask your nurse or doctor.        albuterol 108 (90 Base) MCG/ACT inhaler Commonly known as: VENTOLIN HFA Inhale 2 puffs into the lungs every 6 (six) hours as needed for wheezing or shortness of breath.   amLODipine 10 MG tablet Commonly known as: NORVASC Take 1 tablet (10 mg total) by mouth daily.   aspirin 81 MG tablet Take 81 mg by mouth daily.   baclofen 10 MG tablet Commonly known as: LIORESAL Take 1 tablet (10 mg total) by mouth at bedtime as needed for muscle spasms.   CALCIUM-MAGNESIUM-ZINC-D3 PO Take 1 tablet by mouth daily.   clobetasol cream 0.05 % Commonly known as: TEMOVATE Apply topically 2 (two) times daily.   diphenhydrAMINE 25 MG tablet Commonly known as: BENADRYL Take 25 mg by mouth daily as needed (allergies).   famotidine 20 MG tablet Commonly known as: Pepcid Take 1 tablet (20 mg total) by mouth 2 (two) times daily.   ibuprofen 200 MG  tablet Commonly known as: ADVIL Take 800 mg by mouth 2 (two) times daily.   losartan-hydrochlorothiazide 100-25 MG tablet Commonly known as: HYZAAR Take 1 tablet by mouth daily.   magnesium hydroxide 400 MG/5ML suspension Commonly known as: MILK OF MAGNESIA Take by mouth as needed for mild constipation.   multivitamin tablet Take 1 tablet by mouth daily.   nystatin cream Commonly known as: MYCOSTATIN Apply 1 application topically daily.   Potassium 99 MG Tabs Take by mouth.   rOPINIRole 1  MG tablet Commonly known as: REQUIP Take 1 tablet (1 mg total) by mouth at bedtime. For leg cramps Started by: Claretta Fraise, MD   SYSTANE OP Place 1 drop into both eyes daily as needed (dry eyes).   vitamin C 1000 MG tablet Take 1,000 mg by mouth daily.   Vitamin D-3 25 MCG (1000 UT) Caps Take 2,000 Units by mouth daily.   zinc gluconate 50 MG tablet Take 50 mg by mouth daily.        Follow-up: Return in about 1 month (around 04/04/2020).  Claretta Fraise, M.D.

## 2020-03-08 LAB — CMP14+EGFR
ALT: 22 IU/L (ref 0–32)
AST: 20 IU/L (ref 0–40)
Albumin/Globulin Ratio: 1.6 (ref 1.2–2.2)
Albumin: 3.9 g/dL (ref 3.6–4.6)
Alkaline Phosphatase: 79 IU/L (ref 44–121)
BUN/Creatinine Ratio: 17 (ref 12–28)
BUN: 16 mg/dL (ref 8–27)
Bilirubin Total: 0.4 mg/dL (ref 0.0–1.2)
CO2: 25 mmol/L (ref 20–29)
Calcium: 8.9 mg/dL (ref 8.7–10.3)
Chloride: 102 mmol/L (ref 96–106)
Creatinine, Ser: 0.92 mg/dL (ref 0.57–1.00)
GFR calc Af Amer: 66 mL/min/{1.73_m2} (ref >59)
GFR calc non Af Amer: 57 mL/min/{1.73_m2} — ABNORMAL LOW (ref >59)
Globulin, Total: 2.4 g/dL (ref 1.5–4.5)
Glucose: 105 mg/dL — ABNORMAL HIGH (ref 65–99)
Potassium: 3.5 mmol/L (ref 3.5–5.2)
Sodium: 142 mmol/L (ref 134–144)
Total Protein: 6.3 g/dL (ref 6.0–8.5)

## 2020-03-08 LAB — CBC WITH DIFFERENTIAL/PLATELET
Basophils Absolute: 0.1 10*3/uL (ref 0.0–0.2)
Basos: 1 %
EOS (ABSOLUTE): 0.4 10*3/uL (ref 0.0–0.4)
Eos: 3 %
Hematocrit: 42.1 % (ref 34.0–46.6)
Hemoglobin: 14.1 g/dL (ref 11.1–15.9)
Immature Grans (Abs): 0.1 10*3/uL (ref 0.0–0.1)
Immature Granulocytes: 1 %
Lymphocytes Absolute: 1.9 10*3/uL (ref 0.7–3.1)
Lymphs: 17 %
MCH: 30.7 pg (ref 26.6–33.0)
MCHC: 33.5 g/dL (ref 31.5–35.7)
MCV: 92 fL (ref 79–97)
Monocytes Absolute: 0.9 10*3/uL (ref 0.1–0.9)
Monocytes: 8 %
Neutrophils Absolute: 7.9 10*3/uL — ABNORMAL HIGH (ref 1.4–7.0)
Neutrophils: 70 %
Platelets: 264 10*3/uL (ref 150–450)
RBC: 4.59 x10E6/uL (ref 3.77–5.28)
RDW: 13.2 % (ref 11.7–15.4)
WBC: 11.3 10*3/uL — ABNORMAL HIGH (ref 3.4–10.8)

## 2020-03-09 ENCOUNTER — Encounter: Payer: Self-pay | Admitting: Family Medicine

## 2020-03-09 NOTE — Progress Notes (Signed)
Hello Maigan,  Your lab result is normal and/or stable.Some minor variations that are not significant are commonly marked abnormal, but do not represent any medical problem for you.  Best regards, Ellard Nan, M.D.

## 2020-03-17 ENCOUNTER — Encounter: Payer: Self-pay | Admitting: Family Medicine

## 2020-03-17 ENCOUNTER — Ambulatory Visit (INDEPENDENT_AMBULATORY_CARE_PROVIDER_SITE_OTHER): Payer: Medicare Other | Admitting: Family Medicine

## 2020-03-17 ENCOUNTER — Other Ambulatory Visit: Payer: Self-pay

## 2020-03-17 VITALS — BP 146/72 | HR 82 | Temp 97.6°F | Ht 61.0 in | Wt 229.2 lb

## 2020-03-17 DIAGNOSIS — R252 Cramp and spasm: Secondary | ICD-10-CM | POA: Diagnosis not present

## 2020-03-17 DIAGNOSIS — M545 Low back pain, unspecified: Secondary | ICD-10-CM | POA: Diagnosis not present

## 2020-03-17 LAB — URINALYSIS, COMPLETE
Bilirubin, UA: NEGATIVE
Glucose, UA: NEGATIVE
Ketones, UA: NEGATIVE
Nitrite, UA: NEGATIVE
Protein,UA: NEGATIVE
Specific Gravity, UA: 1.02 (ref 1.005–1.030)
Urobilinogen, Ur: 0.2 mg/dL (ref 0.2–1.0)
pH, UA: 6.5 (ref 5.0–7.5)

## 2020-03-17 LAB — MICROSCOPIC EXAMINATION

## 2020-03-17 MED ORDER — ROPINIROLE HCL 2 MG PO TABS: 2.0000 mg | ORAL_TABLET | Freq: Every day | ORAL | 2 refills | Status: DC

## 2020-03-17 NOTE — Progress Notes (Signed)
Subjective:  Patient ID: Anna Bradford, female    DOB: 12-Mar-1933  Age: 87 y.o. MRN: [id number]  CC: Back Pain (Patient states it has been ongoing but wants to make sure she does not have a UTI./) and Neck Pain (On and off right sided neck pain that has been going on 12/19/19)   HPI Anna Bradford presents for bavkhurts bad. Has arthritis. Has some burning at night that is relieved by urinating.  Ropinirole not helping with cramps.   Depression screen Anna Bradford 2/9 03/07/2020 12/19/2019 11/09/2019  Decreased Interest 0 0 0  Down, Depressed, Hopeless 0 0 0  PHQ - 2 Score 0 0 0    History Anna Bradford has a past medical history of Arthritis, Cancer ([place]) (1996), Essential hypertension, benign, and GERD (gastroesophageal reflux disease).   She has a past surgical history that includes Gallbladder surgery; Hysterectomy ---- unknown; Lapoma Tumor; Right Ankle Surgery; Left Breast Masectomy; Hernia repair; Absess Off Intestine; cataract surgery (Left, 08-10-2011); cataract surgery (Right, 11-01-2011); Cholecystectomy; Colonoscopy (N/A, 02/06/2015); Mass excision (N/A, 01/30/2018); and Abdominal hysterectomy.   Her family history includes Cancer in her mother; Coronary artery disease in her mother; Heart disease in her sister.She reports that she has never smoked. She has never used smokeless tobacco. She reports that she does not drink alcohol and does not use drugs.    ROS Review of Systems Noncontributory except as noted in HPI. Objective:  BP (!) 146/72   Pulse 82   Temp 97.6 F (36.4 C) (Temporal)   Ht 5\' 1"  (1.549 m)   Wt 229 lb 3.2 oz (104 kg)   BMI 43.31 kg/m   BP Readings from Last 3 Encounters:  03/17/20 (!) 146/72  03/07/20 135/68  12/19/19 (!) 176/63    Wt Readings from Last 3 Encounters:  03/17/20 229 lb 3.2 oz (104 kg)  03/07/20 229 lb 6 oz (104 kg)  12/19/19 225 lb (102.1 kg)     Physical Exam Constitutional:      General: She is not in acute distress.    Appearance: She is  well-developed and well-nourished.  Cardiovascular:     Rate and Rhythm: Normal rate and regular rhythm.  Pulmonary:     Breath sounds: Normal breath sounds.  Musculoskeletal:     Left lower leg: Edema (1+) present.  Skin:    General: Skin is warm and dry.  Neurological:     Mental Status: She is alert and oriented to person, place, and time.  Psychiatric:        Mood and Affect: Mood and affect normal.       Assessment & Plan:   Anna Bradford was seen today for back pain and neck pain.  Diagnoses and all orders for this visit:  Low back pain, unspecified back pain laterality, unspecified chronicity, unspecified whether sciatica present -     Urinalysis, Complete  Other orders -     rOPINIRole (REQUIP) 2 MG tablet; Take 1 tablet (2 mg total) by mouth at bedtime. For leg cramps -     Microscopic Examination       I have changed Anna Bradford's rOPINIRole. I am also having her maintain her multivitamin, diphenhydrAMINE, vitamin C, Multiple Minerals-Vitamins (CALCIUM-MAGNESIUM-ZINC-D3 PO), aspirin, zinc gluconate, Vitamin D-3, ibuprofen, Polyethyl Glycol-Propyl Glycol (SYSTANE OP), nystatin cream, Potassium, magnesium hydroxide, albuterol, losartan-hydrochlorothiazide, famotidine, baclofen, amLODipine, and clobetasol cream.  Allergies as of 03/17/2020      Reactions   Macrolides And Ketolides    Bleeding thru bowels  Codeine Nausea And Vomiting   Sulfa Antibiotics Nausea And Vomiting      Medication List       Accurate as of March 17, 2020  7:39 PM. If you have any questions, ask your nurse or doctor.        albuterol 108 (90 Base) MCG/ACT inhaler Commonly known as: VENTOLIN HFA Inhale 2 puffs into the lungs every 6 (six) hours as needed for wheezing or shortness of breath.   amLODipine 10 MG tablet Commonly known as: NORVASC Take 1 tablet (10 mg total) by mouth daily.   aspirin 81 MG tablet Take 81 mg by mouth daily.   baclofen 10 MG tablet Commonly known as:  LIORESAL Take 1 tablet (10 mg total) by mouth at bedtime as needed for muscle spasms.   CALCIUM-MAGNESIUM-ZINC-D3 PO Take 1 tablet by mouth daily.   clobetasol cream 0.05 % Commonly known as: TEMOVATE Apply topically 2 (two) times daily.   diphenhydrAMINE 25 MG tablet Commonly known as: BENADRYL Take 25 mg by mouth daily as needed (allergies).   famotidine 20 MG tablet Commonly known as: Pepcid Take 1 tablet (20 mg total) by mouth 2 (two) times daily.   ibuprofen 200 MG tablet Commonly known as: ADVIL Take 800 mg by mouth 2 (two) times daily.   losartan-hydrochlorothiazide 100-25 MG tablet Commonly known as: HYZAAR Take 1 tablet by mouth daily.   magnesium hydroxide 400 MG/5ML suspension Commonly known as: MILK OF MAGNESIA Take by mouth as needed for mild constipation.   multivitamin tablet Take 1 tablet by mouth daily.   nystatin cream Commonly known as: MYCOSTATIN Apply 1 application topically daily.   Potassium 99 MG Tabs Take by mouth.   rOPINIRole 2 MG tablet Commonly known as: REQUIP Take 1 tablet (2 mg total) by mouth at bedtime. For leg cramps What changed:   medication strength  how much to take Changed by: Claretta Fraise, MD   SYSTANE OP Place 1 drop into both eyes daily as needed (dry eyes).   vitamin C 1000 MG tablet Take 1,000 mg by mouth daily.   Vitamin D-3 25 MCG (1000 UT) Caps Take 2,000 Units by mouth daily.   zinc gluconate 50 MG tablet Take 50 mg by mouth daily.        Follow-up: No follow-ups on file.  Claretta Fraise, M.D.

## 2020-03-19 ENCOUNTER — Ambulatory Visit: Payer: Medicare Other | Admitting: Family Medicine

## 2020-04-20 ENCOUNTER — Emergency Department (HOSPITAL_COMMUNITY)
Admission: EM | Admit: 2020-04-20 | Discharge: 2020-04-21 | Disposition: A | Discharge status: Home / Self Care | Payer: Medicare Other | Attending: Emergency Medicine | Admitting: Emergency Medicine

## 2020-04-20 ENCOUNTER — Encounter (HOSPITAL_COMMUNITY): Payer: Self-pay

## 2020-04-20 ENCOUNTER — Other Ambulatory Visit: Payer: Self-pay

## 2020-04-20 ENCOUNTER — Emergency Department (HOSPITAL_COMMUNITY): Payer: Medicare Other

## 2020-04-20 DIAGNOSIS — Z853 Personal history of malignant neoplasm of breast: Secondary | ICD-10-CM | POA: Diagnosis not present

## 2020-04-20 DIAGNOSIS — R0989 Other specified symptoms and signs involving the circulatory and respiratory systems: Secondary | ICD-10-CM | POA: Diagnosis not present

## 2020-04-20 DIAGNOSIS — M549 Dorsalgia, unspecified: Secondary | ICD-10-CM | POA: Diagnosis not present

## 2020-04-20 DIAGNOSIS — K219 Gastro-esophageal reflux disease without esophagitis: Secondary | ICD-10-CM | POA: Diagnosis not present

## 2020-04-20 DIAGNOSIS — R109 Unspecified abdominal pain: Secondary | ICD-10-CM | POA: Diagnosis not present

## 2020-04-20 DIAGNOSIS — Z79899 Other long term (current) drug therapy: Secondary | ICD-10-CM | POA: Diagnosis not present

## 2020-04-20 DIAGNOSIS — R1031 Right lower quadrant pain: Secondary | ICD-10-CM | POA: Diagnosis not present

## 2020-04-20 DIAGNOSIS — I10 Essential (primary) hypertension: Secondary | ICD-10-CM | POA: Diagnosis not present

## 2020-04-20 DIAGNOSIS — Z7982 Long term (current) use of aspirin: Secondary | ICD-10-CM | POA: Insufficient documentation

## 2020-04-20 DIAGNOSIS — J9811 Atelectasis: Secondary | ICD-10-CM | POA: Diagnosis not present

## 2020-04-20 LAB — HEPATIC FUNCTION PANEL
ALT: 22 U/L (ref 0–44)
AST: 24 U/L (ref 15–41)
Albumin: 3.8 g/dL (ref 3.5–5.0)
Alkaline Phosphatase: 69 U/L (ref 38–126)
Bilirubin, Direct: 0.1 mg/dL (ref 0.0–0.2)
Indirect Bilirubin: 0.5 mg/dL (ref 0.3–0.9)
Total Bilirubin: 0.6 mg/dL (ref 0.3–1.2)
Total Protein: 6.8 g/dL (ref 6.5–8.1)

## 2020-04-20 LAB — BASIC METABOLIC PANEL
Anion gap: 12 (ref 5–15)
BUN: 20 mg/dL (ref 8–23)
CO2: 27 mmol/L (ref 22–32)
Calcium: 9 mg/dL (ref 8.9–10.3)
Chloride: 102 mmol/L (ref 98–111)
Creatinine, Ser: 0.83 mg/dL (ref 0.44–1.00)
GFR, Estimated: >60 mL/min (ref >60)
Glucose, Bld: 99 mg/dL (ref 70–99)
Potassium: 3.2 mmol/L — ABNORMAL LOW (ref 3.5–5.1)
Sodium: 141 mmol/L (ref 135–145)

## 2020-04-20 LAB — CBC WITH DIFFERENTIAL/PLATELET
Abs Immature Granulocytes: 0.04 10*3/uL (ref 0.00–0.07)
Basophils Absolute: 0 10*3/uL (ref 0.0–0.1)
Basophils Relative: 1 %
Eosinophils Absolute: 0.2 10*3/uL (ref 0.0–0.5)
Eosinophils Relative: 4 %
HCT: 43.9 % (ref 36.0–46.0)
Hemoglobin: 14.3 g/dL (ref 12.0–15.0)
Immature Granulocytes: 1 %
Lymphocytes Relative: 28 %
Lymphs Abs: 1.8 10*3/uL (ref 0.7–4.0)
MCH: 30.8 pg (ref 26.0–34.0)
MCHC: 32.6 g/dL (ref 30.0–36.0)
MCV: 94.4 fL (ref 80.0–100.0)
Monocytes Absolute: 0.7 10*3/uL (ref 0.1–1.0)
Monocytes Relative: 11 %
Neutro Abs: 3.6 10*3/uL (ref 1.7–7.7)
Neutrophils Relative %: 55 %
Platelets: 243 10*3/uL (ref 150–400)
RBC: 4.65 MIL/uL (ref 3.87–5.11)
RDW: 13.8 % (ref 11.5–15.5)
WBC: 6.5 10*3/uL (ref 4.0–10.5)
nRBC: 0 % (ref 0.0–0.2)

## 2020-04-20 LAB — URINALYSIS, ROUTINE W REFLEX MICROSCOPIC
Bilirubin Urine: NEGATIVE
Glucose, UA: NEGATIVE mg/dL
Hgb urine dipstick: NEGATIVE
Ketones, ur: NEGATIVE mg/dL
Leukocytes,Ua: NEGATIVE
Nitrite: NEGATIVE
Protein, ur: NEGATIVE mg/dL
Specific Gravity, Urine: 1.043 — ABNORMAL HIGH (ref 1.005–1.030)
pH: 6 (ref 5.0–8.0)

## 2020-04-20 LAB — BRAIN NATRIURETIC PEPTIDE: B Natriuretic Peptide: 168 pg/mL — ABNORMAL HIGH (ref 0.0–100.0)

## 2020-04-20 MED ORDER — IOHEXOL 300 MG/ML  SOLN
100.0000 mL | Freq: Once | INTRAMUSCULAR | Status: AC | PRN
  Administered 2020-04-20: 100 mL via INTRAVENOUS

## 2020-04-20 MED ORDER — POTASSIUM CHLORIDE CRYS ER 20 MEQ PO TBCR
40.0000 meq | EXTENDED_RELEASE_TABLET | Freq: Once | ORAL | Status: AC
  Administered 2020-04-21: 40 meq via ORAL
  Filled 2020-04-20: qty 2

## 2020-04-20 NOTE — ED Notes (Signed)
[name] in triage x1 Anna Bradford

## 2020-04-20 NOTE — ED Triage Notes (Signed)
Pt to er, pt states that she is here for some back pain, states that she has had the back pain for the past several weeks, denies injury, states that she is here today because the pain is getting worse, states that she has been taking a pain pill that has been helping, but now she wants to know why she is having pain.

## 2020-04-20 NOTE — ED Notes (Signed)
ED Provider at bedside. 

## 2020-04-20 NOTE — ED Provider Notes (Signed)
MSE was initiated and I personally evaluated the patient and placed orders (if any) at  6:39 PM on April 20, 2020.   87- year old female with 4 weeks of progressively worsening right flank pain that radiates to the RLQ, with urinary frequency and urgency. Denies fevers or chills at home. BLE edema as well, which is new.   VS stable, may wait in waiting room. Orders placed.   The patient appears stable so that the remainder of the MSE may be completed by another provider.   Aura Dials 04/20/20 1840    Sherwood Gambler, MD 04/23/20 [phone]

## 2020-04-21 NOTE — Discharge Instructions (Signed)
If you develop worsening, recurrent, or continued back pain, numbness or weakness in the legs, incontinence of your bowels or bladders, numbness of your buttocks, fever, abdominal pain, or any other new/concerning symptoms then return to the ER for evaluation.  

## 2020-04-21 NOTE — ED Provider Notes (Signed)
[facility] EMERGENCY DEPARTMENT Provider Note   CSN: [id number] Arrival date & time: 04/20/20  1752     History Chief Complaint  Patient presents with  . Back Pain    Anna Bradford is a 87 y.o. female.  HPI 87 year old female presents with right-sided abdominal and back pain.  It seems to primarily come from the back and wraps around to the abdomen.  She has been having 6 months or more of burning type numbness in her legs but no new numbness no new weakness with this.  She has been ambulatory.  No injuries.  Has taken ibuprofen with no relief.  No fevers or urinary symptoms. Feels like there are knots that are painful. No incontinence.   Past Medical History:  Diagnosis Date  . Arthritis   . Cancer [facility]) 1996   left breast  . Essential hypertension, benign   . GERD (gastroesophageal reflux disease)     Patient Active Problem List   Diagnosis Date Noted  . Osteoarthritis 09/11/2018  . Mass of subcutaneous tissue of back 01/19/2018  . Mass of soft tissue of abdomen 01/19/2018  . Severe obesity (BMI 35.0-39.9) with comorbidity ([place]) 12/13/2017  . Osteoporosis without current pathological fracture 10/01/2015  . Varicose veins of lower extremities with other complications 46/96/2952  . Precordial pain 08/27/2011  . Essential hypertension, benign 08/27/2011  . GERD (gastroesophageal reflux disease) 08/27/2011    Past Surgical History:  Procedure Laterality Date  . ABDOMINAL HYSTERECTOMY    . Absess Off Intestine    . cataract surgery Left 08-10-2011  . cataract surgery Right 11-01-2011  . CHOLECYSTECTOMY     1972  . COLONOSCOPY N/A 02/06/2015   Procedure: COLONOSCOPY;  Surgeon: Rogene Houston, MD;  Location: [facility] ENDO SUITE;  Service: Endoscopy;  Laterality: N/A;  1015  . GALLBLADDER SURGERY     Rupture Lower Stomach  . HERNIA REPAIR    . Hysterectomy ---- unknown     1980  . [place] Tumor    . Left Breast Masectomy     1986  . MASS EXCISION N/A 01/30/2018    Procedure: EXCISION MASS 3CM ON BACK AND 3CM ON ABDOMEN;  Surgeon: Virl Cagey, MD;  Location: [facility] ORS;  Service: General;  Laterality: N/A;  . Right Ankle Surgery       OB History    Gravida      Para      Term      Preterm      AB      Living  2     SAB      IAB      Ectopic      Multiple      Live Births              Family History  Problem Relation Age of Onset  . Cancer Mother   . Coronary artery disease Mother   . Heart disease Sister     Social History   Tobacco Use  . Smoking status: Never Smoker  . Smokeless tobacco: Never Used  Vaping Use  . Vaping Use: Never used  Substance Use Topics  . Alcohol use: No  . Drug use: No    Home Medications Prior to Admission medications   Medication Sig Start Date End Date Taking? Authorizing Provider  albuterol (VENTOLIN HFA) 108 (90 Base) MCG/ACT inhaler Inhale 2 puffs into the lungs every 6 (six) hours as needed for wheezing or shortness of breath. 02/06/19  Yes Ronnie Doss M, DO  amLODipine (NORVASC) 10 MG tablet Take 1 tablet (10 mg total) by mouth daily. 01/23/20  Yes Dettinger, Fransisca Kaufmann, MD  Ascorbic Acid (VITAMIN C) 1000 MG tablet Take 1,000 mg by mouth daily.    Yes [provider]  aspirin 81 MG tablet Take 81 mg by mouth daily.   Yes [provider]  baclofen (LIORESAL) 10 MG tablet Take 1 tablet (10 mg total) by mouth at bedtime as needed for muscle spasms. 12/19/19  Yes Dettinger, Fransisca Kaufmann, MD  Cholecalciferol (VITAMIN D-3) 1000 UNITS CAPS Take 2,000 Units by mouth daily.   Yes [provider]  diphenhydrAMINE (BENADRYL) 25 MG tablet Take 25 mg by mouth daily as needed (allergies).    Yes [provider]  famotidine (PEPCID) 20 MG tablet Take 1 tablet (20 mg total) by mouth 2 (two) times daily. 11/23/19  Yes Dettinger, Fransisca Kaufmann, MD  ibuprofen (ADVIL,MOTRIN) 200 MG tablet Take 800 mg by mouth 2 (two) times daily.    Yes [provider]   losartan-hydrochlorothiazide (HYZAAR) 100-25 MG tablet Take 1 tablet by mouth daily. 11/23/19  Yes Dettinger, Fransisca Kaufmann, MD  magnesium hydroxide (MILK OF MAGNESIA) 400 MG/5ML suspension Take by mouth as needed for mild constipation.   Yes [provider]  Multiple Minerals-Vitamins (CALCIUM-MAGNESIUM-ZINC-D3 PO) Take 1 tablet by mouth daily.   Yes [provider]  Multiple Vitamin (MULTIVITAMIN) tablet Take 1 tablet by mouth daily.   Yes [provider]  nystatin cream (MYCOSTATIN) Apply 1 application topically daily.   Yes [provider]  Polyethyl Glycol-Propyl Glycol (SYSTANE OP) Place 1 drop into both eyes daily as needed (dry eyes).   Yes [provider]  Potassium 99 MG TABS Take 1 tablet by mouth daily.   Yes [provider]  rOPINIRole (REQUIP) 2 MG tablet Take 1 tablet (2 mg total) by mouth at bedtime. For leg cramps 03/17/20  Yes Claretta Fraise, MD  zinc gluconate 50 MG tablet Take 50 mg by mouth daily.   Yes [provider]  clobetasol cream (TEMOVATE) 0.05 % Apply topically 2 (two) times daily. Patient not taking: No sig reported 01/24/20   Dettinger, Fransisca Kaufmann, MD    Allergies    Macrolides and ketolides, Codeine, and Sulfa antibiotics  Review of Systems   Review of Systems  Constitutional: Negative for fever.  Gastrointestinal: Positive for abdominal pain.  Genitourinary: Positive for flank pain. Negative for dysuria.  Musculoskeletal: Positive for back pain.  Neurological: Negative for weakness.  All other systems reviewed and are negative.   Physical Exam Updated Vital Signs BP (!) 148/65   Pulse 80   Temp 97.8 F (36.6 C) (Oral)   Resp 20   Ht 5\' 5"  (1.651 m)   Wt 99.8 kg   SpO2 100%   BMI 36.61 kg/m   Physical Exam Vitals and nursing note reviewed.  Constitutional:      Appearance: She is well-developed. She is obese.  HENT:     Head: Normocephalic and atraumatic.     Right Ear: External ear  normal.     Left Ear: External ear normal.     Nose: Nose normal.  Eyes:     General:        Right eye: No discharge.        Left eye: No discharge.  Cardiovascular:     Rate and Rhythm: Normal rate and regular rhythm.     Heart sounds: Normal heart  sounds.  Pulmonary:     Effort: Pulmonary effort is normal.     Breath sounds: Normal breath sounds.  Abdominal:     Palpations: Abdomen is soft.     Tenderness: There is no abdominal tenderness.    Musculoskeletal:     Thoracic back: Tenderness present. No bony tenderness.       Back:     Comments: Diffuse tenderness along right CVA and right flank/RLQ Possible subcutaneous masses in right flank and abdomen.  Skin:    General: Skin is warm and dry.  Neurological:     Mental Status: She is alert.     Comments: 5/5 strength in BLE. Grossly normal sensation Able to ambulate without difficulty  Psychiatric:        Mood and Affect: Mood is not anxious.     ED Results / Procedures / Treatments   Labs (all labs ordered are listed, but only abnormal results are displayed) Labs Reviewed  BASIC METABOLIC PANEL - Abnormal; Notable for the following components:      Result Value   Potassium 3.2 (*)    All other components within normal limits  URINALYSIS, ROUTINE W REFLEX MICROSCOPIC - Abnormal; Notable for the following components:   Color, Urine STRAW (*)    Specific Gravity, Urine 1.043 (*)    All other components within normal limits  BRAIN NATRIURETIC PEPTIDE - Abnormal; Notable for the following components:   B Natriuretic Peptide 168.0 (*)    All other components within normal limits  URINE CULTURE  HEPATIC FUNCTION PANEL  CBC WITH DIFFERENTIAL/PLATELET    EKG None  Radiology CT Abdomen Pelvis W Contrast  Result Date: 04/20/2020 CLINICAL DATA:  Right lower quadrant pain EXAM: CT ABDOMEN AND PELVIS WITH CONTRAST TECHNIQUE: Multidetector CT imaging of the abdomen and pelvis was performed using the standard protocol  following bolus administration of intravenous contrast. CONTRAST:  150mL OMNIPAQUE IOHEXOL 300 MG/ML  SOLN COMPARISON:  03/14/2019 FINDINGS: Lower chest: No acute abnormality Hepatobiliary: Prior cholecystectomy. Prominent common bile duct measuring up to 13 mm, stable since prior study likely related to post cholecystectomy state and age. No focal hepatic abnormality. Pancreas: No focal abnormality or ductal dilatation. Spleen: No focal abnormality.  Normal size. Adrenals/Urinary Tract: Bilateral renal parapelvic cysts. No hydronephrosis. No renal or adrenal mass. Urinary bladder decompressed, grossly unremarkable. Stomach/Bowel: Stomach, large and small bowel grossly unremarkable. Scattered sigmoid diverticulosis. No active diverticulitis. Vascular/Lymphatic: Aortoiliac atherosclerosis. No evidence of aneurysm or adenopathy. Reproductive: Prior hysterectomy.  No adnexal masses. Other: No free fluid or free air. Musculoskeletal: No acute bony abnormality. IMPRESSION: Sigmoid diverticulosis.  No active diverticulitis. No acute findings in the abdomen or pelvis. Ancillary findings as above. Electronically Signed   By: Rolm Baptise M.D.   On: 04/20/2020 21:30   DG Chest Portable 1 View  Result Date: 04/20/2020 CLINICAL DATA:  History of breast cancer and left-sided mastectomy with rales. EXAM: PORTABLE CHEST 1 VIEW COMPARISON:  None. FINDINGS: Mild linear atelectasis is seen within the left lung base. There is no evidence of acute infiltrate, pleural effusion or pneumothorax. Radiopaque surgical clips are seen overlying the left axilla. The heart size and mediastinal contours are within normal limits. The visualized skeletal structures are unremarkable. IMPRESSION: Mild left basilar linear atelectasis. Electronically Signed   By: Virgina Norfolk M.D.   On: 04/20/2020 19:56    Procedures Procedures   Medications Ordered in ED Medications  iohexol (OMNIPAQUE) 300 MG/ML solution 100 mL (100 mLs Intravenous  Contrast Given  04/20/20 2110)  potassium chloride SA (KLOR-CON) CR tablet 40 mEq (40 mEq Oral Given 04/21/20 0001)    ED Course  I have reviewed the triage vital signs and the nursing notes.  Pertinent labs & imaging results that were available during my care of the patient were reviewed by me and considered in my medical decision making (see chart for details).    MDM Rules/Calculators/A&P                          Patient's pain does not sound like an acute spinal process.  Unclear cause and the CT that was obtained is benign.  There is no obvious lipoma which could explain her subcutaneous masses.  No obvious ureteral stone.  No rash to suggest varicella-zoster.  Urinalysis is unremarkable.  Has hypokalemia but otherwise labs are benign.  Unclear cause and she has declined pain meds here.  She does have a muscle relaxer at home but has not taken it yet and would would be reasonable to try this at home.  Will discharge home with return precautions.  Of note, she endorses weeks of lower extremity swelling bilaterally. No dyspnea. Possibly from her norvasc.  Final Clinical Impression(s) / ED Diagnoses Final diagnoses:  Acute right flank pain    Rx / DC Orders ED Discharge Orders    None       Sherwood Gambler, MD 04/21/20 0025

## 2020-04-22 LAB — URINE CULTURE

## 2020-04-24 ENCOUNTER — Encounter (HOSPITAL_COMMUNITY): Payer: Self-pay

## 2020-04-24 ENCOUNTER — Other Ambulatory Visit: Payer: Self-pay

## 2020-04-24 ENCOUNTER — Emergency Department (HOSPITAL_COMMUNITY)
Admission: EM | Admit: 2020-04-24 | Discharge: 2020-04-24 | Disposition: A | Discharge status: Home / Self Care | Payer: Medicare Other | Attending: Emergency Medicine | Admitting: Emergency Medicine

## 2020-04-24 ENCOUNTER — Ambulatory Visit (INDEPENDENT_AMBULATORY_CARE_PROVIDER_SITE_OTHER): Payer: Medicare Other | Admitting: Family Medicine

## 2020-04-24 ENCOUNTER — Encounter: Payer: Self-pay | Admitting: Family Medicine

## 2020-04-24 VITALS — BP 157/71 | HR 75 | Ht 65.0 in | Wt 228.0 lb

## 2020-04-24 DIAGNOSIS — I10 Essential (primary) hypertension: Secondary | ICD-10-CM | POA: Insufficient documentation

## 2020-04-24 DIAGNOSIS — R1031 Right lower quadrant pain: Secondary | ICD-10-CM | POA: Diagnosis present

## 2020-04-24 DIAGNOSIS — M545 Low back pain, unspecified: Secondary | ICD-10-CM | POA: Insufficient documentation

## 2020-04-24 DIAGNOSIS — Z79899 Other long term (current) drug therapy: Secondary | ICD-10-CM | POA: Insufficient documentation

## 2020-04-24 DIAGNOSIS — R109 Unspecified abdominal pain: Secondary | ICD-10-CM

## 2020-04-24 DIAGNOSIS — K219 Gastro-esophageal reflux disease without esophagitis: Secondary | ICD-10-CM | POA: Insufficient documentation

## 2020-04-24 DIAGNOSIS — M5459 Other low back pain: Secondary | ICD-10-CM | POA: Diagnosis not present

## 2020-04-24 DIAGNOSIS — Z7982 Long term (current) use of aspirin: Secondary | ICD-10-CM | POA: Diagnosis not present

## 2020-04-24 DIAGNOSIS — Z853 Personal history of malignant neoplasm of breast: Secondary | ICD-10-CM | POA: Insufficient documentation

## 2020-04-24 LAB — MICROSCOPIC EXAMINATION
Bacteria, UA: NONE SEEN
RBC, Urine: NONE SEEN /hpf (ref 0–2)

## 2020-04-24 LAB — URINALYSIS, COMPLETE
Bilirubin, UA: NEGATIVE
Glucose, UA: NEGATIVE
Ketones, UA: NEGATIVE
Nitrite, UA: NEGATIVE
Protein,UA: NEGATIVE
RBC, UA: NEGATIVE
Specific Gravity, UA: 1.02 (ref 1.005–1.030)
Urobilinogen, Ur: 0.2 mg/dL (ref 0.2–1.0)
pH, UA: 7.5 (ref 5.0–7.5)

## 2020-04-24 MED ORDER — HYDROCODONE-ACETAMINOPHEN 5-325 MG PO TABS: 1.0000 | ORAL_TABLET | Freq: Four times a day (QID) | ORAL | 0 refills | Status: DC | PRN

## 2020-04-24 MED ORDER — PREDNISONE 20 MG PO TABS
40.0000 mg | ORAL_TABLET | Freq: Once | ORAL | Status: AC
  Administered 2020-04-24: 40 mg via ORAL
  Filled 2020-04-24: qty 2

## 2020-04-24 MED ORDER — HYDROCODONE-ACETAMINOPHEN 5-325 MG PO TABS
1.0000 | ORAL_TABLET | Freq: Once | ORAL | Status: AC
Start: 2020-04-24 — End: 2020-04-24
  Administered 2020-04-24: 1 via ORAL
  Filled 2020-04-24: qty 1

## 2020-04-24 MED ORDER — PREDNISONE 10 MG PO TABS: 20.0000 mg | ORAL_TABLET | Freq: Two times a day (BID) | ORAL | 0 refills | Status: DC

## 2020-04-24 NOTE — Progress Notes (Signed)
BP (!) 157/71   Pulse 75   Ht '5\' 5"'  (1.651 m)   Wt 228 lb (103.4 kg)   SpO2 95%   BMI 37.94 kg/m    Subjective:   Patient ID: Anna Bradford, female    DOB: 04-Apr-1933, 87 y.o.   MRN: [id number]  HPI: Anna Bradford is a 87 y.o. female presenting on 04/24/2020 for Medical Management of Chronic Issues, Hypertension, and Gastroesophageal Reflux   HPI Right flank pain Patient has had continued right flank pain over the past 5 days.  She says is been going on longer but worse over the past 5 days and she also been having urinary frequency and burning.  She says that has worsened over the past 5 days.  She was seen in the emergency department where they found her to have low potassium and did a urine and a CT scan did not find any other answers and sent her home with 2 potassium pills to take on that day.  She says is not any better but not any worse.  Relevant past medical, surgical, family and social history reviewed and updated as indicated. Interim medical history since our last visit reviewed. Allergies and medications reviewed and updated.  Review of Systems  Constitutional: Negative for chills and fever.  Eyes: Negative for visual disturbance.  Respiratory: Negative for chest tightness and shortness of breath.   Cardiovascular: Negative for chest pain and leg swelling.  Gastrointestinal: Positive for abdominal pain.  Genitourinary: Positive for dysuria, flank pain, frequency and urgency. Negative for decreased urine volume, difficulty urinating, pelvic pain, vaginal bleeding, vaginal discharge and vaginal pain.  Musculoskeletal: Negative for back pain and gait problem.  Skin: Negative for rash.  Neurological: Negative for light-headedness and headaches.  Psychiatric/Behavioral: Negative for agitation and behavioral problems.  All other systems reviewed and are negative.   Per HPI unless specifically indicated above   Allergies as of 04/24/2020      Reactions   Macrolides And  Ketolides    Bleeding thru bowels   Codeine Nausea And Vomiting   Sulfa Antibiotics Nausea And Vomiting      Medication List       Accurate as of April 24, 2020  4:42 PM. If you have any questions, ask your nurse or doctor.        albuterol 108 (90 Base) MCG/ACT inhaler Commonly known as: VENTOLIN HFA Inhale 2 puffs into the lungs every 6 (six) hours as needed for wheezing or shortness of breath.   amLODipine 10 MG tablet Commonly known as: NORVASC Take 1 tablet (10 mg total) by mouth daily.   aspirin 81 MG tablet Take 81 mg by mouth daily.   baclofen 10 MG tablet Commonly known as: LIORESAL Take 1 tablet (10 mg total) by mouth at bedtime as needed for muscle spasms.   CALCIUM-MAGNESIUM-ZINC-D3 PO Take 1 tablet by mouth daily.   clobetasol cream 0.05 % Commonly known as: TEMOVATE Apply topically 2 (two) times daily.   diphenhydrAMINE 25 MG tablet Commonly known as: BENADRYL Take 25 mg by mouth daily as needed (allergies).   famotidine 20 MG tablet Commonly known as: Pepcid Take 1 tablet (20 mg total) by mouth 2 (two) times daily.   ibuprofen 200 MG tablet Commonly known as: ADVIL Take 800 mg by mouth 2 (two) times daily.   losartan-hydrochlorothiazide 100-25 MG tablet Commonly known as: HYZAAR Take 1 tablet by mouth daily.   magnesium hydroxide 400 MG/5ML suspension Commonly known as: MILK  OF MAGNESIA Take by mouth as needed for mild constipation.   multivitamin tablet Take 1 tablet by mouth daily.   nystatin cream Commonly known as: MYCOSTATIN Apply 1 application topically daily.   Potassium 99 MG Tabs Take 1 tablet by mouth daily.   rOPINIRole 2 MG tablet Commonly known as: REQUIP Take 1 tablet (2 mg total) by mouth at bedtime. For leg cramps   SYSTANE OP Place 1 drop into both eyes daily as needed (dry eyes).   vitamin C 1000 MG tablet Take 1,000 mg by mouth daily.   Vitamin D-3 25 MCG (1000 UT) Caps Take 2,000 Units by mouth daily.    zinc gluconate 50 MG tablet Take 50 mg by mouth daily.        Objective:   BP (!) 157/71   Pulse 75   Ht '5\' 5"'  (1.651 m)   Wt 228 lb (103.4 kg)   SpO2 95%   BMI 37.94 kg/m   Wt Readings from Last 3 Encounters:  04/24/20 228 lb (103.4 kg)  04/20/20 220 lb (99.8 kg)  03/17/20 229 lb 3.2 oz (104 kg)    Physical Exam Vitals and nursing note reviewed.  Constitutional:      General: She is not in acute distress.    Appearance: She is well-developed. She is not diaphoretic.  Eyes:     Conjunctiva/sclera: Conjunctivae normal.  Abdominal:     General: Abdomen is flat. Bowel sounds are normal. There is no distension.     Tenderness: There is no abdominal tenderness. There is no right CVA tenderness, left CVA tenderness, guarding or rebound.  Musculoskeletal:        General: Tenderness (Right lower mid back pain) present. Normal range of motion.  Skin:    General: Skin is warm and dry.     Findings: No rash.  Neurological:     Mental Status: She is alert and oriented to person, place, and time.     Coordination: Coordination normal.  Psychiatric:        Behavior: Behavior normal.       Assessment & Plan:   Problem List Items Addressed This Visit   None   Visit Diagnoses    Flank pain    -  Primary   Relevant Orders   Urinalysis, Complete   Urine Culture   BMP8+EGFR   Magnesium      Will recheck urine culture, symptoms of the urine worsen.  Likely could also be muscle spasms.  We will see where her potassium is because it was low in the emergency department. Follow up plan: Return in about 2 months (around 06/24/2020), or if symptoms worsen or fail to improve, for Blood pressure recheck.  Counseling provided for all of the vaccine components Orders Placed This Encounter  Procedures  . Urine Culture  . Urinalysis, Complete  . BMP8+EGFR  . Magnesium    Caryl Pina, MD [facility] 04/24/2020, 4:42 PM

## 2020-04-24 NOTE — ED Triage Notes (Signed)
Pt presents from home with c/o right sided flank pain that moves from back to side to abdomen. Pt was seen on 04/20/20 for this and seen by her Dr today. Pain still present.

## 2020-04-24 NOTE — Discharge Instructions (Addendum)
Begin taking prednisone as prescribed.  Begin taking hydrocodone as prescribed as needed for pain.  Rest, with no heavy lifting or strenuous activity for the next several days.  If symptoms or not improving through the weekend, follow-up with your primary doctor for reassessment.  Return to the emergency department in the meantime if symptoms significantly worsen or change.

## 2020-04-24 NOTE — ED Provider Notes (Signed)
[facility] EMERGENCY DEPARTMENT Provider Note   CSN: [id number] Arrival date & time: 04/24/20  2216     History Chief Complaint  Patient presents with  . Flank Pain    Anna Bradford is a 87 y.o. female.  Patient is an 87 year old female with history of hypertension, GERD, arthritis, obesity.  Patient presents today for evaluation of right flank pain.  This has been ongoing for several weeks, however became much worse on Sunday after she attended church.  She describes the pain in her right lower lumbar region that she describes as a stabbing.  The pain is worse when she attempts to move and ambulate.  The pain radiates to the right lower quadrant.  She denies any weakness or numbness in her leg.  She denies any bowel or bladder complaints.  She was seen here 3 days ago with similar complaints and was told it was likely musculoskeletal.  A CT scan of the abdomen and pelvis was unremarkable, urinalysis was clear.  She did have a potassium of 3.2.  Labs were repeated today by her primary doctor, however are still pending.  The history is provided by the patient.  Flank Pain This is a new problem. Episode onset: 5 days ago. The problem occurs constantly. The problem has been gradually worsening. The symptoms are aggravated by walking. Nothing relieves the symptoms.       Past Medical History:  Diagnosis Date  . Arthritis   . Cancer (HCC) 1996   left breast  . Essential hypertension, benign   . GERD (gastroesophageal reflux disease)     Patient Active Problem List   Diagnosis Date Noted  . Osteoarthritis 09/11/2018  . Mass of subcutaneous tissue of back 01/19/2018  . Mass of soft tissue of abdomen 01/19/2018  . Severe obesity (BMI 35.0-39.9) with comorbidity (HCC) 12/13/2017  . Osteoporosis without current pathological fracture 10/01/2015  . Varicose veins of lower extremities with other complications 03/19/2013  . Precordial pain 08/27/2011  . Essential hypertension, benign  08/27/2011  . GERD (gastroesophageal reflux disease) 08/27/2011    Past Surgical History:  Procedure Laterality Date  . ABDOMINAL HYSTERECTOMY    . Absess Off Intestine    . cataract surgery Left 08-10-2011  . cataract surgery Right 11-01-2011  . CHOLECYSTECTOMY     1972  . COLONOSCOPY N/A 02/06/2015   Procedure: COLONOSCOPY;  Surgeon: Najeeb U Rehman, MD;  Location: [facility] ENDO SUITE;  Service: Endoscopy;  Laterality: N/A;  1015  . GALLBLADDER SURGERY     Rupture Lower Stomach  . HERNIA REPAIR    . Hysterectomy ---- unknown     1980  . Lapoma Tumor    . Left Breast Masectomy     19 86  . MASS EXCISION N/A 01/30/2018   Procedure: EXCISION MASS 3CM ON BACK AND 3CM ON ABDOMEN;  Surgeon: Virl Cagey, MD;  Location: [facility] ORS;  Service: General;  Laterality: N/A;  . Right Ankle Surgery       OB History    Gravida      Para      Term      Preterm      AB      Living  2     SAB      IAB      Ectopic      Multiple      Live Births              Family History  Problem Relation Age  of Onset  . Cancer Mother   . Coronary artery disease Mother   . Heart disease Sister     Social History   Tobacco Use  . Smoking status: Never Smoker  . Smokeless tobacco: Never Used  Vaping Use  . Vaping Use: Never used  Substance Use Topics  . Alcohol use: No  . Drug use: No    Home Medications Prior to Admission medications   Medication Sig Start Date End Date Taking? Authorizing Provider  albuterol (VENTOLIN HFA) 108 (90 Base) MCG/ACT inhaler Inhale 2 puffs into the lungs every 6 (six) hours as needed for wheezing or shortness of breath. 02/06/19   Janora Norlander, DO  amLODipine (NORVASC) 10 MG tablet Take 1 tablet (10 mg total) by mouth daily. 01/23/20   Dettinger, Fransisca Kaufmann, MD  Ascorbic Acid (VITAMIN C) 1000 MG tablet Take 1,000 mg by mouth daily.     [provider]  aspirin 81 MG tablet Take 81 mg by mouth daily.    [provider]  baclofen  (LIORESAL) 10 MG tablet Take 1 tablet (10 mg total) by mouth at bedtime as needed for muscle spasms. 12/19/19   Dettinger, Fransisca Kaufmann, MD  Cholecalciferol (VITAMIN D-3) 1000 UNITS CAPS Take 2,000 Units by mouth daily.    [provider]  clobetasol cream (TEMOVATE) 0.05 % Apply topically 2 (two) times daily. 01/24/20   Dettinger, Fransisca Kaufmann, MD  diphenhydrAMINE (BENADRYL) 25 MG tablet Take 25 mg by mouth daily as needed (allergies).     [provider]  famotidine (PEPCID) 20 MG tablet Take 1 tablet (20 mg total) by mouth 2 (two) times daily. 11/23/19   Dettinger, Fransisca Kaufmann, MD  ibuprofen (ADVIL,MOTRIN) 200 MG tablet Take 800 mg by mouth 2 (two) times daily.     [provider]  losartan-hydrochlorothiazide (HYZAAR) 100-25 MG tablet Take 1 tablet by mouth daily. 11/23/19   Dettinger, Fransisca Kaufmann, MD  magnesium hydroxide (MILK OF MAGNESIA) 400 MG/5ML suspension Take by mouth as needed for mild constipation.    [provider]  Multiple Minerals-Vitamins (CALCIUM-MAGNESIUM-ZINC-D3 PO) Take 1 tablet by mouth daily.    [provider]  Multiple Vitamin (MULTIVITAMIN) tablet Take 1 tablet by mouth daily.    [provider]  nystatin cream (MYCOSTATIN) Apply 1 application topically daily.    [provider]  Polyethyl Glycol-Propyl Glycol (SYSTANE OP) Place 1 drop into both eyes daily as needed (dry eyes).    [provider]  Potassium 99 MG TABS Take 1 tablet by mouth daily.    [provider]  rOPINIRole (REQUIP) 2 MG tablet Take 1 tablet (2 mg total) by mouth at bedtime. For leg cramps 03/17/20   Claretta Fraise, MD  zinc gluconate 50 MG tablet Take 50 mg by mouth daily.    [provider]    Allergies    Macrolides and ketolides, Codeine, and Sulfa antibiotics  Review of Systems   Review of Systems  Genitourinary: Positive for flank pain.  All other systems reviewed and are negative.   Physical Exam Updated Vital  Signs Temp 97.8 F (36.6 C) (Oral)   Ht 5\' 5"  (1.651 m)   Wt 103.4 kg   BMI 37.93 kg/m   Physical Exam Vitals and nursing note reviewed.  Constitutional:      General: She is not in acute distress.    Appearance: Normal appearance. She is not ill-appearing.  HENT:     Head: Normocephalic and atraumatic.  Pulmonary:     Effort: Pulmonary effort is normal.  Abdominal:     General: Abdomen is flat. There is no distension.     Tenderness: There is abdominal tenderness.     Comments: There is mild tenderness to the right lower quadrant, but no palpable abnormality.  Musculoskeletal:     Comments: There is tenderness to palpation in the right lower lumbar region/upper buttock.  Skin:    General: Skin is warm and dry.  Neurological:     Mental Status: She is alert and oriented to person, place, and time.     Comments: Strength is 5 out of 5 in both lower extremities.  DTRs are trace and symmetrical in both lower extremities.  Sensation and pulses are intact throughout both lower legs and feet.     ED Results / Procedures / Treatments   Labs (all labs ordered are listed, but only abnormal results are displayed) Labs Reviewed - No data to display  EKG None  Radiology No results found.  Procedures Procedures   Medications Ordered in ED Medications  predniSONE (DELTASONE) tablet 40 mg (has no administration in time range)  HYDROcodone-acetaminophen (NORCO/VICODIN) 5-325 MG per tablet 1 tablet (has no administration in time range)    ED Course  I have reviewed the triage vital signs and the nursing notes.  Pertinent labs & imaging results that were available during my care of the patient were reviewed by me and considered in my medical decision making (see chart for details).    MDM Rules/Calculators/A&P  Patient presents here with complaints of right lower lumbar pain that is worse with ambulation, palpation, and movement.  It seems very musculoskeletal in nature.   Whether this is some sort of sciatica or muscular, I am uncertain.  She did have a CT scan 3 days ago which was negative and saw her primary doctor today.  At this point, I will prescribe prednisone and hydrocodone for inflammation and pain.  She has been taking Flexeril with little relief.  Final Clinical Impression(s) / ED Diagnoses Final diagnoses:  None    Rx / DC Orders ED Discharge Orders    None       Veryl Speak, MD 04/24/20 2325

## 2020-04-25 ENCOUNTER — Telehealth: Payer: Self-pay

## 2020-04-25 LAB — BMP8+EGFR
BUN/Creatinine Ratio: 16 (ref 12–28)
BUN: 14 mg/dL (ref 8–27)
CO2: 26 mmol/L (ref 20–29)
Calcium: 9.2 mg/dL (ref 8.7–10.3)
Chloride: 99 mmol/L (ref 96–106)
Creatinine, Ser: 0.9 mg/dL (ref 0.57–1.00)
Glucose: 88 mg/dL (ref 65–99)
Potassium: 4.2 mmol/L (ref 3.5–5.2)
Sodium: 140 mmol/L (ref 134–144)
eGFR: 63 mL/min/{1.73_m2} (ref >59)

## 2020-04-25 LAB — MAGNESIUM: Magnesium: 2 mg/dL (ref 1.6–2.3)

## 2020-04-25 NOTE — Telephone Encounter (Signed)
Pt wants Dr Dettinger to review her lab results and have someone call her with those results.  Says she had to go to the ER again last night and was told she had pinched nerves and was given Rx's of Prednisone and Hydrocodone.

## 2020-04-26 LAB — URINE CULTURE

## 2020-05-19 ENCOUNTER — Other Ambulatory Visit: Payer: Self-pay | Admitting: Family Medicine

## 2020-05-19 ENCOUNTER — Telehealth: Payer: Self-pay

## 2020-05-19 MED ORDER — PREDNISONE 10 MG PO TABS: 20.0000 mg | ORAL_TABLET | Freq: Two times a day (BID) | ORAL | 0 refills | Status: DC

## 2020-05-19 NOTE — Progress Notes (Signed)
Called in another short course of prednisone but we cannot do this too frequently throughout the year because of side effects from it, we only like to do it if few times a year so if she continues to have issues we may have to evaluate this further. Caryl Pina, MD [facility] 05/19/2020, 10:45 AM

## 2020-05-19 NOTE — Progress Notes (Signed)
Pt informed and made aware to call for an appt if her symptoms continue.

## 2020-05-22 ENCOUNTER — Encounter: Payer: Self-pay | Admitting: Family Medicine

## 2020-05-22 ENCOUNTER — Other Ambulatory Visit: Payer: Self-pay

## 2020-05-22 ENCOUNTER — Ambulatory Visit (INDEPENDENT_AMBULATORY_CARE_PROVIDER_SITE_OTHER): Payer: Medicare Other | Admitting: Family Medicine

## 2020-05-22 VITALS — BP 116/65 | HR 82 | Temp 98.1°F | Ht 65.0 in

## 2020-05-22 DIAGNOSIS — M5136 Other intervertebral disc degeneration, lumbar region: Secondary | ICD-10-CM | POA: Diagnosis not present

## 2020-05-22 NOTE — Patient Instructions (Signed)
Degenerative Disk Disease  Degenerative disk disease is a condition caused by changes that occur in the spinal disks as a person ages. Spinal disks are soft and compressible disks located between the bones of your spine (vertebrae). These disks act like shock absorbers. Degenerative disk disease can affect the whole spine. However, the neck and lower back are most often affected. Many changes can occur in the spinal disks with aging, such as:  The spinal disks may dry and shrink.  Small tears may occur in the tough, outer covering of the disk (annulus).  The disk space may become smaller due to loss of water.  Abnormal growths in the bone (spurs) may occur. This can put pressure on the nerve roots exiting the spinal canal, causing pain.  The spinal canal may become narrowed. What are the causes? This condition may be caused by:  Normal degeneration with age.  Injuries.  Certain activities and sports that cause damage. What increases the risk? The following factors may make you more likely to develop this condition:  Being overweight.  Having a family history of degenerative disk disease.  Smoking and use of products that contain nicotine and tobacco.  Sudden injury.  Doing work that requires heavy lifting. What are the signs or symptoms? Symptoms of this condition include:  Pain that varies in intensity. Some people have no pain, while others have severe pain. The location of the pain depends on the part of your backbone that is affected. You may have: ? Pain in your neck or arm if a disk in your neck area is affected. ? Pain in your back, buttocks, or legs if a disk in your lower back is affected.  Pain that becomes worse while bending or reaching up, or with twisting movements.  Pain that may start gradually and worsen as time passes. It may also start after a major or minor injury.  Numbness or tingling in the arms or legs. How is this diagnosed? This condition may  be diagnosed based on:  Your symptoms and medical history.  A physical exam.  Imaging tests, including: ? X-ray of the spine. ? CT scan. ? MRI. How is this treated? This condition may be treated with:  Medicines.  Injection of steroids into the back.  Rehabilitation exercises. These activities aim to strengthen muscles in your back and abdomen to better support your spine. If treatments do not help to relieve your symptoms or you have severe pain, you may need surgery. Follow these instructions at home: Medicines  Take over-the-counter and prescription medicines only as told by your health care provider.  Ask your health care provider if the medicine prescribed to you: ? Requires you to avoid driving or using machinery. ? Can cause constipation. You may need to take these actions to prevent or treat constipation:  Drink enough fluid to keep your urine pale yellow.  Take over-the-counter or prescription medicines.  Eat foods that are high in fiber, such as beans, whole grains, and fresh fruits and vegetables.  Limit foods that are high in fat and processed sugars, such as fried or sweet foods. Activity  Rest as told by your health care provider.  Avoid sitting for a long time without moving. Get up to take short walks every 1-2 hours. This is important to improve blood flow and breathing. Ask for help if you feel weak or unsteady.  Return to your normal activities as told by your health care provider. Ask your health care provider what activities   are safe for you.  Perform relaxation exercises as told by your health care provider.  Maintain good posture.  Do not lift anything that is heavier than 10 lb (4.5 kg), or the limit that you are told, until your health care provider says that it is safe.  Follow proper lifting and walking techniques as told by your health care provider. Managing pain, stiffness, and swelling  If directed, put ice on the painful area. Icing  can help to relieve pain. To do this: ? Put ice in a plastic bag. ? Place a towel between your skin and the bag. ? Leave the ice on for 20 minutes, 2-3 times a day. ? Remove the ice if your skin turns bright red. This is very important. If you cannot feel pain, heat, or cold, you have a greater risk of damage to the area.  If directed, apply heat to the painful area as often as told by your health care provider. Heat can reduce the stiffness of your muscles. Use the heat source that your health care provider recommends, such as a moist heat pack or a heating pad. ? Place a towel between your skin and the heat source. ? Leave the heat on for 20-30 minutes. ? Remove the heat if your skin turns bright red. This is especially important if you are unable to feel pain, heat, or cold. You may have a greater risk of getting burned.      General instructions  Change your sitting, standing, and sleeping habits as told by your health care provider.  Avoid sitting in the same position for long periods of time. Change positions frequently.  Lose weight or maintain a healthy weight as told by your health care provider.  Do not use any products that contain nicotine or tobacco, such as cigarettes, e-cigarettes, and chewing tobacco. If you need help quitting, ask your health care provider.  Wear supportive footwear.  Keep all follow-up visits. This is important. This may include visits for physical therapy. Contact a health care provider if you:  Have pain that does not go away within 1-4 weeks.  Lose your appetite.  Lose weight without trying. Get help right away if you:  Have severe pain.  Notice weakness in your arms, hands, or legs.  Begin to lose control of your bladder or bowel movements.  Have fevers or night sweats. Summary  Degenerative disk disease is a condition caused by changes that occur in the spinal disks as a person ages.  This condition can affect the whole spine.  However, the neck and lower back are most often affected.  Take over-the-counter and prescription medicines only as told by your health care provider. This information is not intended to replace advice given to you by your health care provider. Make sure you discuss any questions you have with your health care provider. Document Revised: 04/19/2019 Document Reviewed: 04/19/2019 Elsevier Patient Education  2021 Elsevier Inc.  

## 2020-05-22 NOTE — Progress Notes (Signed)
Acute Office Visit  Subjective:    Patient ID: Anna Bradford, female    DOB: 1933-08-06, 87 y.o.   MRN: [id number]  Chief Complaint  Patient presents with  . Back Pain    HPI Patient is in today for back pain. She has a history of chronic lower back pain. She reports an increase in her pain for the last few weeks. She has been seen in the ED twice for this. She was told she was having muscle spasm. She was given a muscle relaxer. She was given prednisone at the next visit. She reports a burning pain across her lower back. The also reports a stabbing pain in her left thigh that is intermittent with walking sometimes. She is currently taking prednisone. This has been helpful. She denies changes in her bowel or bladder control. She has a muscle cream that she has also been using with good relief. Baclofen has been helpful. She takes advil as needed as well. She had a Xray in 2021 that shower lumbar DDD. She denies injury or trauma since then.   Past Medical History:  Diagnosis Date  . Arthritis   . Cancer [facility]) 1996   left breast  . Essential hypertension, benign   . GERD (gastroesophageal reflux disease)     Past Surgical History:  Procedure Laterality Date  . ABDOMINAL HYSTERECTOMY    . Absess Off Intestine    . cataract surgery Left 08-10-2011  . cataract surgery Right 11-01-2011  . CHOLECYSTECTOMY     1972  . COLONOSCOPY N/A 02/06/2015   Procedure: COLONOSCOPY;  Surgeon: Rogene Houston, MD;  Location: [facility];  Service: Endoscopy;  Laterality: N/A;  1015  . GALLBLADDER SURGERY     Rupture Lower Stomach  . HERNIA REPAIR    . Hysterectomy ---- unknown     1980  . [place] Tumor    . Left Breast Masectomy     1986  . MASS EXCISION N/A 01/30/2018   Procedure: EXCISION MASS 3CM ON BACK AND 3CM ON ABDOMEN;  Surgeon: Virl Cagey, MD;  Location: [facility] ORS;  Service: General;  Laterality: N/A;  . Right Ankle Surgery      Family History  Problem Relation Age of Onset  .  Cancer Mother   . Coronary artery disease Mother   . Heart disease Sister     Social History   Socioeconomic History  . Marital status: Widowed    Spouse name: Not on file  . Number of children: 2  . Years of education: 12-failed the 4th grade and had to repeat  . Highest education level: 11th grade  Occupational History  . Occupation: retired  Tobacco Use  . Smoking status: Never Smoker  . Smokeless tobacco: Never Used  Vaping Use  . Vaping Use: Never used  Substance and Sexual Activity  . Alcohol use: No  . Drug use: No  . Sexual activity: Not Currently    Birth control/protection: Surgical  Other Topics Concern  . Not on file  Social History Narrative  . Not on file   Social Determinants of Health   Financial Resource Strain: Low Risk   . Difficulty of Paying Living Expenses: Not hard at all  Food Insecurity: No Food Insecurity  . Worried About Charity fundraiser in the Last Year: Never true  . Ran Out of Food in the Last Year: Never true  Transportation Needs: No Transportation Needs  . Lack of Transportation (Medical): No  .  Lack of Transportation (Non-Medical): No  Physical Activity: Inactive  . Days of Exercise per Week: 0 days  . Minutes of Exercise per Session: 0 min  Stress: No Stress Concern Present  . Feeling of Stress : Not at all  Social Connections: Moderately Integrated  . Frequency of Communication with Friends and Family: More than three times a week  . Frequency of Social Gatherings with Friends and Family: More than three times a week  . Attends Religious Services: More than 4 times per year  . Active Member of Clubs or Organizations: Yes  . Attends Archivist Meetings: More than 4 times per year  . Marital Status: Widowed  Intimate Partner Violence: Not At Risk  . Fear of Current or Ex-Partner: No  . Emotionally Abused: No  . Physically Abused: No  . Sexually Abused: No    Outpatient Medications Prior to Visit  Medication Sig  Dispense Refill  . albuterol (VENTOLIN HFA) 108 (90 Base) MCG/ACT inhaler Inhale 2 puffs into the lungs every 6 (six) hours as needed for wheezing or shortness of breath. 8 g 0  . amLODipine (NORVASC) 10 MG tablet Take 1 tablet (10 mg total) by mouth daily. 90 tablet 3  . Ascorbic Acid (VITAMIN C) 1000 MG tablet Take 1,000 mg by mouth daily.     Marland Kitchen aspirin 81 MG tablet Take 81 mg by mouth daily.    . baclofen (LIORESAL) 10 MG tablet Take 1 tablet (10 mg total) by mouth at bedtime as needed for muscle spasms. 30 each 1  . Cholecalciferol (VITAMIN D-3) 1000 UNITS CAPS Take 2,000 Units by mouth daily.    . clobetasol cream (TEMOVATE) 0.05 % Apply topically 2 (two) times daily. 30 g 0  . diphenhydrAMINE (BENADRYL) 25 MG tablet Take 25 mg by mouth daily as needed (allergies).     . famotidine (PEPCID) 20 MG tablet Take 1 tablet (20 mg total) by mouth 2 (two) times daily. 180 tablet 3  . ibuprofen (ADVIL,MOTRIN) 200 MG tablet Take 800 mg by mouth 2 (two) times daily.     Marland Kitchen losartan-hydrochlorothiazide (HYZAAR) 100-25 MG tablet Take 1 tablet by mouth daily. 90 tablet 3  . magnesium hydroxide (MILK OF MAGNESIA) 400 MG/5ML suspension Take by mouth as needed for mild constipation.    . Multiple Minerals-Vitamins (CALCIUM-MAGNESIUM-ZINC-D3 PO) Take 1 tablet by mouth daily.    . Multiple Vitamin (MULTIVITAMIN) tablet Take 1 tablet by mouth daily.    Marland Kitchen nystatin cream (MYCOSTATIN) Apply 1 application topically daily.    Vladimir Faster Glycol-Propyl Glycol (SYSTANE OP) Place 1 drop into both eyes daily as needed (dry eyes).    . Potassium 99 MG TABS Take 1 tablet by mouth daily.    . predniSONE (DELTASONE) 10 MG tablet Take 2 tablets (20 mg total) by mouth 2 (two) times daily with a meal. 20 tablet 0  . rOPINIRole (REQUIP) 2 MG tablet Take 1 tablet (2 mg total) by mouth at bedtime. For leg cramps 30 tablet 2  . zinc gluconate 50 MG tablet Take 50 mg by mouth daily.    Marland Kitchen HYDROcodone-acetaminophen (NORCO) 5-325 MG  tablet Take 1-2 tablets by mouth every 6 (six) hours as needed. (Patient not taking: Reported on 05/22/2020) 15 tablet 0   No facility-administered medications prior to visit.    Allergies  Allergen Reactions  . Macrolides And Ketolides     Bleeding thru bowels  . Codeine Nausea And Vomiting  . Sulfa Antibiotics Nausea And Vomiting  Review of Systems As per HPI.    Objective:    Physical Exam Vitals and nursing note reviewed.  Constitutional:      General: She is not in acute distress.    Appearance: She is not ill-appearing, toxic-appearing or diaphoretic.  Pulmonary:     Effort: Pulmonary effort is normal. No respiratory distress.  Musculoskeletal:     Lumbar back: Tenderness (lumbar, paraspinal) present. No swelling, edema, deformity, lacerations or bony tenderness.     Right lower leg: 1+ Edema present.     Left lower leg: 1+ Edema present.  Neurological:     Mental Status: She is alert and oriented to person, place, and time.     Gait: Gait abnormal (in wheelchair).  Psychiatric:        Mood and Affect: Mood normal.        Behavior: Behavior normal.     BP 116/65   Pulse 82   Temp 98.1 F (36.7 C) (Temporal)   Ht _0  (1.651 m)   BMI 37.93 kg/m  Wt Readings from Last 3 Encounters:  04/24/20 227 lb 15.3 oz (103.4 kg)  04/24/20 228 lb (103.4 kg)  04/20/20 220 lb (99.8 kg)    Health Maintenance Due  Topic Date Due  . COVID-19 Vaccine (3 - Booster for Moderna series) 10/04/2019    There are no preventive care reminders to display for this patient.   No results found for: TSH Lab Results  Component Value Date   WBC 6.5 04/20/2020   HGB 14.3 04/20/2020   HCT 43.9 04/20/2020   MCV 94.4 04/20/2020   PLT 243 04/20/2020   Lab Results  Component Value Date   NA 140 04/24/2020   K 4.2 04/24/2020   CO2 26 04/24/2020   GLUCOSE 88 04/24/2020   BUN 14 04/24/2020   CREATININE 0.90 04/24/2020   BILITOT 0.6 04/20/2020   ALKPHOS 69 04/20/2020   AST 24  04/20/2020   ALT 22 04/20/2020   PROT 6.8 04/20/2020   ALBUMIN 3.8 04/20/2020   CALCIUM 9.2 04/24/2020   ANIONGAP 12 04/20/2020   EGFR 63 04/24/2020   Lab Results  Component Value Date   CHOL 189 09/17/2019   Lab Results  Component Value Date   HDL 41 09/17/2019   Lab Results  Component Value Date   LDLCALC 114 (H) 09/17/2019   Lab Results  Component Value Date   TRIG 191 (H) 09/17/2019   Lab Results  Component Value Date   CHOLHDL 4.6 (H) 09/17/2019   No results found for: HGBA1C     Assessment & Plan:   Harlo was seen today for back pain.  Diagnoses and all orders for this visit:  DDD (degenerative disc disease), lumbar Chronic low back pain with recent worsening. No red flags, no new injury. Currently taking oral prednisone, baclofen, and advil with some relief. Referral to ortho placed today. She is established with Dr. Brooke Bonito office.  -     Ambulatory referral to Orthopedic Surgery  Follow up as needed.   The patient indicates understanding of these issues and agrees with the plan.  Gwenlyn Perking, FNP

## 2020-05-29 ENCOUNTER — Ambulatory Visit (INDEPENDENT_AMBULATORY_CARE_PROVIDER_SITE_OTHER): Payer: Medicare Other | Admitting: Orthopaedic Surgery

## 2020-05-29 ENCOUNTER — Encounter: Payer: Self-pay | Admitting: Orthopaedic Surgery

## 2020-05-29 ENCOUNTER — Other Ambulatory Visit: Payer: Self-pay

## 2020-05-29 VITALS — Ht 65.0 in | Wt 229.4 lb

## 2020-05-29 DIAGNOSIS — G8929 Other chronic pain: Secondary | ICD-10-CM | POA: Diagnosis not present

## 2020-05-29 DIAGNOSIS — M5442 Lumbago with sciatica, left side: Secondary | ICD-10-CM

## 2020-05-29 NOTE — Progress Notes (Signed)
Patient VO:HYWVP N Dillinger, female DOB:July 07, 1933, 87 y.o. XTG:[id number]  Chief Complaint  Patient presents with  . Back Pain    NKI, LBP, moves around to the sides, into both legs, sharp stinging pain, legs weak at times, cannot lift them. Happens intermittently, random.    HPI  SUSIE EHRESMAN is a 87 y.o. female who has developed lower back pain with left sided sciatica over the last month getting worse.  She has been to the ER twice, once on 04-20-20 and then 04-24-20.  She has had CT exam which was negative. She has been on prednisone, hydrocodone and flexeril.  She still has pain with a grabbing pain that runs down to the left foot.  She denies falls, trauma.  She has no weakness. She is not getting better.  I have reviewed the ER records.  I have independently reviewed and interpreted x-rays of this patient done at another site by another physician or qualified health professional.     Body mass index is 38.17 kg/m.  ROS  Review of Systems  HENT: Negative for congestion.   Respiratory: Negative for cough and shortness of breath.   Cardiovascular: Negative for chest pain and leg swelling.  Endocrine: Positive for cold intolerance.  Musculoskeletal: Positive for arthralgias, back pain, gait problem and joint swelling.  Allergic/Immunologic: Positive for environmental allergies.    All other systems reviewed and are negative.  The following is a summary of the past history medically, past history surgically, known current medicines, social history and family history.  This information is gathered electronically by the computer from prior information and documentation.  I review this each visit and have found including this information at this point in the chart is beneficial and informative.    Past Medical History:  Diagnosis Date  . Arthritis   . Cancer [facility]) 1996   left breast  . Essential hypertension, benign   . GERD (gastroesophageal reflux disease)     Past Surgical  History:  Procedure Laterality Date  . ABDOMINAL HYSTERECTOMY    . Absess Off Intestine    . cataract surgery Left 08-10-2011  . cataract surgery Right 11-01-2011  . CHOLECYSTECTOMY     1972  . COLONOSCOPY N/A 02/06/2015   Procedure: COLONOSCOPY;  Surgeon: Rogene Houston, MD;  Location: [facility];  Service: Endoscopy;  Laterality: N/A;  1015  . GALLBLADDER SURGERY     Rupture Lower Stomach  . HERNIA REPAIR    . Hysterectomy ---- unknown     1980  . [place] Tumor    . Left Breast Masectomy     1986  . MASS EXCISION N/A 01/30/2018   Procedure: EXCISION MASS 3CM ON BACK AND 3CM ON ABDOMEN;  Surgeon: Virl Cagey, MD;  Location: [facility] ORS;  Service: General;  Laterality: N/A;  . Right Ankle Surgery      Family History  Problem Relation Age of Onset  . Cancer Mother   . Coronary artery disease Mother   . Heart disease Sister     Social History Social History   Tobacco Use  . Smoking status: Never Smoker  . Smokeless tobacco: Never Used  Vaping Use  . Vaping Use: Never used  Substance Use Topics  . Alcohol use: No  . Drug use: No    Allergies  Allergen Reactions  . Macrolides And Ketolides     Bleeding thru bowels  . Codeine Nausea And Vomiting  . Sulfa Antibiotics Nausea And Vomiting  Current Outpatient Medications  Medication Sig Dispense Refill  . albuterol (VENTOLIN HFA) 108 (90 Base) MCG/ACT inhaler Inhale 2 puffs into the lungs every 6 (six) hours as needed for wheezing or shortness of breath. 8 g 0  . amLODipine (NORVASC) 10 MG tablet Take 1 tablet (10 mg total) by mouth daily. 90 tablet 3  . Ascorbic Acid (VITAMIN C) 1000 MG tablet Take 1,000 mg by mouth daily.     Marland Kitchen aspirin 81 MG tablet Take 81 mg by mouth daily.    . baclofen (LIORESAL) 10 MG tablet Take 1 tablet (10 mg total) by mouth at bedtime as needed for muscle spasms. 30 each 1  . Cholecalciferol (VITAMIN D-3) 1000 UNITS CAPS Take 2,000 Units by mouth daily.    . clobetasol cream (TEMOVATE)  0.05 % Apply topically 2 (two) times daily. 30 g 0  . diphenhydrAMINE (BENADRYL) 25 MG tablet Take 25 mg by mouth daily as needed (allergies).     . famotidine (PEPCID) 20 MG tablet Take 1 tablet (20 mg total) by mouth 2 (two) times daily. 180 tablet 3  . HYDROcodone-acetaminophen (NORCO) 5-325 MG tablet Take 1-2 tablets by mouth every 6 (six) hours as needed. 15 tablet 0  . ibuprofen (ADVIL,MOTRIN) 200 MG tablet Take 800 mg by mouth 2 (two) times daily.     Marland Kitchen losartan-hydrochlorothiazide (HYZAAR) 100-25 MG tablet Take 1 tablet by mouth daily. 90 tablet 3  . magnesium hydroxide (MILK OF MAGNESIA) 400 MG/5ML suspension Take by mouth as needed for mild constipation.    . Multiple Minerals-Vitamins (CALCIUM-MAGNESIUM-ZINC-D3 PO) Take 1 tablet by mouth daily.    . Multiple Vitamin (MULTIVITAMIN) tablet Take 1 tablet by mouth daily.    Marland Kitchen nystatin cream (MYCOSTATIN) Apply 1 application topically daily.    Vladimir Faster Glycol-Propyl Glycol (SYSTANE OP) Place 1 drop into both eyes daily as needed (dry eyes).    . Potassium 99 MG TABS Take 1 tablet by mouth daily.    . predniSONE (DELTASONE) 10 MG tablet Take 2 tablets (20 mg total) by mouth 2 (two) times daily with a meal. 20 tablet 0  . rOPINIRole (REQUIP) 2 MG tablet Take 1 tablet (2 mg total) by mouth at bedtime. For leg cramps 30 tablet 2  . zinc gluconate 50 MG tablet Take 50 mg by mouth daily.     No current facility-administered medications for this visit.     Physical Exam  Height 5\' 5"  (1.651 m), weight 229 lb 6.4 oz (104.1 kg).  Constitutional: overall normal hygiene, normal nutrition, well developed, normal grooming, normal body habitus. Assistive device:walker  Musculoskeletal: gait and station Limp left, muscle tone and strength are normal, no tremors or atrophy is present.  .  Neurological: coordination overall normal.  Deep tendon reflex/nerve stretch intact.  Sensation normal.  Cranial nerves II-XII intact.   Skin:   Normal  overall no scars, lesions, ulcers or rashes. No psoriasis.  Psychiatric: Alert and oriented x 3.  Recent memory intact, remote memory unclear.  Normal mood and affect. Well groomed.  Good eye contact.  Cardiovascular: overall no swelling, no varicosities, no edema bilaterally, normal temperatures of the legs and arms, no clubbing, cyanosis and good capillary refill.  Spine/Pelvis examination:  Inspection:  Overall, sacoiliac joint benign and hips nontender; without crepitus or defects.   Thoracic spine inspection: Alignment normal without kyphosis present   Lumbar spine inspection:  Alignment  with normal lumbar lordosis, without scoliosis apparent.   Thoracic spine palpation:  without  tenderness of spinal processes   Lumbar spine palpation: without tenderness of lumbar area; without tightness of lumbar muscles    Range of Motion:   Lumbar flexion, forward flexion is abnormal with pain and tenderness.    Lumbar extension is full without pain or tenderness   Left lateral bend is normal without pain or tenderness   Right lateral bend is normal without pain or tenderness   Straight leg raising is abnormal, positive at 25 degrees left  Strength & tone: normal   Stability overall normal stability  Lymphatic: palpation is normal.  All other systems reviewed and are negative   The patient has been educated about the nature of the problem(s) and counseled on treatment options.  The patient appeared to understand what I have discussed and is in agreement with it.  Encounter Diagnosis  Name Primary?  . Chronic left-sided low back pain with left-sided sciatica Yes    PLAN Call if any problems.  Precautions discussed.  Continue current medications.   Return to clinic 3 weeks   I will get MRI of the lumbar spine to rule out HNP.  Electronically Signed Sanjuana Kava, MD 5/12/202210:03 AM

## 2020-06-17 ENCOUNTER — Ambulatory Visit
Admission: RE | Admit: 2020-06-17 | Discharge: 2020-06-17 | Disposition: A | Discharge status: Home / Self Care | Payer: Medicare Other | Source: Ambulatory Visit | Attending: Orthopaedic Surgery | Admitting: Orthopaedic Surgery

## 2020-06-17 DIAGNOSIS — M545 Low back pain, unspecified: Secondary | ICD-10-CM | POA: Diagnosis not present

## 2020-06-17 DIAGNOSIS — M48061 Spinal stenosis, lumbar region without neurogenic claudication: Secondary | ICD-10-CM | POA: Diagnosis not present

## 2020-06-17 DIAGNOSIS — G8929 Other chronic pain: Secondary | ICD-10-CM

## 2020-06-19 ENCOUNTER — Other Ambulatory Visit: Payer: Self-pay

## 2020-06-19 ENCOUNTER — Encounter: Payer: Self-pay | Admitting: Orthopaedic Surgery

## 2020-06-19 ENCOUNTER — Ambulatory Visit: Payer: Medicare Other | Admitting: Orthopaedic Surgery

## 2020-06-19 VITALS — Ht 65.0 in | Wt 229.0 lb

## 2020-06-19 DIAGNOSIS — G8929 Other chronic pain: Secondary | ICD-10-CM | POA: Diagnosis not present

## 2020-06-19 DIAGNOSIS — M5442 Lumbago with sciatica, left side: Secondary | ICD-10-CM | POA: Diagnosis not present

## 2020-06-19 DIAGNOSIS — M7061 Trochanteric bursitis, right hip: Secondary | ICD-10-CM

## 2020-06-19 NOTE — Progress Notes (Signed)
Patient Anna Bradford, female DOB:05-05-1933, 87 y.o. WEX:[id number]  Chief Complaint  Patient presents with  . Back Pain    LBP with sciatica    HPI  Anna Bradford is a 87 y.o. female who has lower back pain and left sided sciatica.  She had MRI which showed:  IMPRESSION: 1. Multilevel lumbar disc and facet degeneration with mild spinal stenosis at L2-3, L3-4, and L4-5. 2. Moderate right and mild left neural foraminal stenosis at L5-S1. 3. Mild neural foraminal stenosis at L3-4 and L4-5.  I have explained the findings to her.  I have independently reviewed the MRI.     She is better with her back.  She said she got out of a high to get in truck and when getting out she stretched her back but her pain went away, most of it, after that.  She has pain of the right lateral hip again, recurrent bursitis that is not getting any better.   Body mass index is 38.11 kg/m.  ROS  Review of Systems  All other systems reviewed and are negative.  The following is a summary of the past history medically, past history surgically, known current medicines, social history and family history.  This information is gathered electronically by the computer from prior information and documentation.  I review this each visit and have found including this information at this point in the chart is beneficial and informative.    Past Medical History:  Diagnosis Date  . Arthritis   . Cancer [facility]) 1996   left breast  . Essential hypertension, benign   . GERD (gastroesophageal reflux disease)     Past Surgical History:  Procedure Laterality Date  . ABDOMINAL HYSTERECTOMY    . Absess Off Intestine    . cataract surgery Left 08-10-2011  . cataract surgery Right 11-01-2011  . CHOLECYSTECTOMY     1972  . COLONOSCOPY N/A 02/06/2015   Procedure: COLONOSCOPY;  Surgeon: Rogene Houston, MD;  Location: [facility];  Service: Endoscopy;  Laterality: N/A;  1015  . GALLBLADDER SURGERY     Rupture  Lower Stomach  . HERNIA REPAIR    . Hysterectomy ---- unknown     1980  . [place] Tumor    . Left Breast Masectomy     1986  . MASS EXCISION N/A 01/30/2018   Procedure: EXCISION MASS 3CM ON BACK AND 3CM ON ABDOMEN;  Surgeon: Virl Cagey, MD;  Location: [facility] ORS;  Service: General;  Laterality: N/A;  . Right Ankle Surgery      Family History  Problem Relation Age of Onset  . Cancer Mother   . Coronary artery disease Mother   . Heart disease Sister     Social History Social History   Tobacco Use  . Smoking status: Never Smoker  . Smokeless tobacco: Never Used  Vaping Use  . Vaping Use: Never used  Substance Use Topics  . Alcohol use: No  . Drug use: No    Allergies  Allergen Reactions  . Macrolides And Ketolides     Bleeding thru bowels  . Codeine Nausea And Vomiting  . Sulfa Antibiotics Nausea And Vomiting    Current Outpatient Medications  Medication Sig Dispense Refill  . albuterol (VENTOLIN HFA) 108 (90 Base) MCG/ACT inhaler Inhale 2 puffs into the lungs every 6 (six) hours as needed for wheezing or shortness of breath. 8 g 0  . amLODipine (NORVASC) 10 MG tablet Take 1 tablet (10 mg total) by  mouth daily. 90 tablet 3  . Ascorbic Acid (VITAMIN C) 1000 MG tablet Take 1,000 mg by mouth daily.     Marland Kitchen aspirin 81 MG tablet Take 81 mg by mouth daily.    . baclofen (LIORESAL) 10 MG tablet Take 1 tablet (10 mg total) by mouth at bedtime as needed for muscle spasms. 30 each 1  . Cholecalciferol (VITAMIN D-3) 1000 UNITS CAPS Take 2,000 Units by mouth daily.    . clobetasol cream (TEMOVATE) 0.05 % Apply topically 2 (two) times daily. 30 g 0  . diphenhydrAMINE (BENADRYL) 25 MG tablet Take 25 mg by mouth daily as needed (allergies).     . famotidine (PEPCID) 20 MG tablet Take 1 tablet (20 mg total) by mouth 2 (two) times daily. 180 tablet 3  . HYDROcodone-acetaminophen (NORCO) 5-325 MG tablet Take 1-2 tablets by mouth every 6 (six) hours as needed. 15 tablet 0  . ibuprofen  (ADVIL,MOTRIN) 200 MG tablet Take 800 mg by mouth 2 (two) times daily.     Marland Kitchen losartan-hydrochlorothiazide (HYZAAR) 100-25 MG tablet Take 1 tablet by mouth daily. 90 tablet 3  . magnesium hydroxide (MILK OF MAGNESIA) 400 MG/5ML suspension Take by mouth as needed for mild constipation.    . Multiple Minerals-Vitamins (CALCIUM-MAGNESIUM-ZINC-D3 PO) Take 1 tablet by mouth daily.    . Multiple Vitamin (MULTIVITAMIN) tablet Take 1 tablet by mouth daily.    Marland Kitchen nystatin cream (MYCOSTATIN) Apply 1 application topically daily.    Vladimir Faster Glycol-Propyl Glycol (SYSTANE OP) Place 1 drop into both eyes daily as needed (dry eyes).    . Potassium 99 MG TABS Take 1 tablet by mouth daily.    . predniSONE (DELTASONE) 10 MG tablet Take 2 tablets (20 mg total) by mouth 2 (two) times daily with a meal. 20 tablet 0  . rOPINIRole (REQUIP) 2 MG tablet Take 1 tablet (2 mg total) by mouth at bedtime. For leg cramps 30 tablet 2  . zinc gluconate 50 MG tablet Take 50 mg by mouth daily.     No current facility-administered medications for this visit.     Physical Exam  Height 5\' 5"  (1.651 m), weight 229 lb (103.9 kg).  Constitutional: overall normal hygiene, normal nutrition, well developed, normal grooming, normal body habitus. Assistive device:none  Musculoskeletal: gait and station Limp right, muscle tone and strength are normal, no tremors or atrophy is present.  .  Neurological: coordination overall normal.  Deep tendon reflex/nerve stretch intact.  Sensation normal.  Cranial nerves II-XII intact.   Skin:   Normal overall no scars, lesions, ulcers or rashes. No psoriasis.  Psychiatric: Alert and oriented x 3.  Recent memory intact, remote memory unclear.  Normal mood and affect. Well groomed.  Good eye contact.  Cardiovascular: overall no swelling, no varicosities, no edema bilaterally, normal temperatures of the legs and arms, no clubbing, cyanosis and good capillary refill.  Lymphatic: palpation is  normal.  Right hip is tender over the lateral bursa area.  NV intact. ROM of hip is full.  No redness.  Spine/Pelvis examination:  Inspection:  Overall, sacoiliac joint benign and hips nontender; without crepitus or defects.   Thoracic spine inspection: Alignment normal without kyphosis present   Lumbar spine inspection:  Alignment  with normal lumbar lordosis, without scoliosis apparent.   Thoracic spine palpation:  without tenderness of spinal processes   Lumbar spine palpation: without tenderness of lumbar area; without tightness of lumbar muscles    Range of Motion:   Lumbar flexion,  forward flexion is normal without pain or tenderness    Lumbar extension is full without pain or tenderness   Left lateral bend is normal without pain or tenderness   Right lateral bend is normal without pain or tenderness   Straight leg raising is normal  Strength & tone: normal   Stability overall normal stability  All other systems reviewed and are negative   The patient has been educated about the nature of the problem(s) and counseled on treatment options.  The patient appeared to understand what I have discussed and is in agreement with it.  Encounter Diagnoses  Name Primary?  . Chronic left-sided low back pain with left-sided sciatica Yes  . Greater trochanteric bursitis of right hip    PROCEDURE NOTE:  The patient request injection, verbal consent was obtained.  The right trochanteric area of the hip was prepped appropriately after time out was performed.   Sterile technique was observed and injection of 1 cc of DepoMedrol 40 mg with several cc's of plain xylocaine. Anesthesia was provided by ethyl chloride and a 20-gauge needle was used to inject the hip area. The injection was tolerated well.  A band aid dressing was applied.  The patient was advised to apply ice later today and tomorrow to the injection sight as needed.   PLAN Call if any problems.  Precautions discussed.   Continue current medications.   Return to clinic 1 month   Electronically Signed Sanjuana Kava, MD 6/2/202210:02 AM

## 2020-06-25 ENCOUNTER — Other Ambulatory Visit: Payer: Self-pay

## 2020-06-25 ENCOUNTER — Ambulatory Visit (INDEPENDENT_AMBULATORY_CARE_PROVIDER_SITE_OTHER): Payer: Medicare Other | Admitting: Family Medicine

## 2020-06-25 ENCOUNTER — Encounter: Payer: Self-pay | Admitting: Family Medicine

## 2020-06-25 VITALS — BP 139/75 | HR 85 | Ht 65.0 in | Wt 226.0 lb

## 2020-06-25 DIAGNOSIS — N3281 Overactive bladder: Secondary | ICD-10-CM | POA: Diagnosis not present

## 2020-06-25 DIAGNOSIS — I10 Essential (primary) hypertension: Secondary | ICD-10-CM | POA: Diagnosis not present

## 2020-06-25 DIAGNOSIS — K219 Gastro-esophageal reflux disease without esophagitis: Secondary | ICD-10-CM | POA: Diagnosis not present

## 2020-06-25 MED ORDER — MIRABEGRON ER 25 MG PO TB24: 25.0000 mg | ORAL_TABLET | Freq: Every day | ORAL | 1 refills | Status: DC

## 2020-06-25 NOTE — Addendum Note (Signed)
Addended by: Caryl Pina on: 06/25/2020 01:48 PM   Modules accepted: Orders

## 2020-06-25 NOTE — Progress Notes (Signed)
BP 139/75   Pulse 85   Ht 5\' 5"  (1.651 m)   Wt 226 lb (102.5 kg)   SpO2 96%   BMI 37.61 kg/m    Subjective:   Patient ID: Anna Bradford, Bradford    DOB: Mar 01, 1933, 87 y.o.   MRN: [id number]  HPI: Anna Bradford presenting on 06/25/2020 for Medical Management of Chronic Issues and Hypertension   HPI Patient comes in complaining of waking up multiple times at night to urinate and then sometimes she can hardly make it to the restroom because she has to go so badly.  She says she wakes up at least 3 or 4 times a night.  She denies any dysuria but just has the frequency.  Hypertension Patient is currently on amlodipine and losartan hydrochlorothiazide, and their blood pressure today is 139/75 and 116/65 at home. Patient denies any lightheadedness or dizziness. Patient denies headaches, blurred vision, chest pains, shortness of breath, or weakness. Denies any side effects from medication and is content with current medication.  Patient is still having swelling in her legs and will try off the amlodipine and see if that does better for her.  She actually is feeling a lot better today regarding her back and knees  GERD Patient is currently on famotidine.  She denies any major symptoms or abdominal pain or belching or burping. She denies any blood in her stool or lightheadedness or dizziness.   Patient has chronic back issues related related to her weight and we discussed this in being more active would help her.  Relevant past medical, surgical, family and social history reviewed and updated as indicated. Interim medical history since our last visit reviewed. Allergies and medications reviewed and updated.  Review of Systems  Constitutional: Negative for chills and fever.  Eyes: Negative for redness and visual disturbance.  Respiratory: Negative for chest tightness and shortness of breath.   Cardiovascular: Positive for leg swelling. Negative for chest pain and  palpitations.  Musculoskeletal: Positive for arthralgias and back pain. Negative for gait problem.  Skin: Negative for rash.  Neurological: Negative for light-headedness and headaches.  Psychiatric/Behavioral: Negative for agitation and behavioral problems.  All other systems reviewed and are negative.   Per HPI unless specifically indicated above   Allergies as of 06/25/2020      Reactions   Macrolides And Ketolides    Bleeding thru bowels   Codeine Nausea And Vomiting   Sulfa Antibiotics Nausea And Vomiting      Medication List       Accurate as of June 25, 2020  1:47 PM. If you have any questions, ask your nurse or doctor.        STOP taking these medications   amLODipine 10 MG tablet Commonly known as: NORVASC Stopped by: Fransisca Kaufmann [name], MD     TAKE these medications   albuterol 108 (90 Base) MCG/ACT inhaler Commonly known as: VENTOLIN HFA Inhale 2 puffs into the lungs every 6 (six) hours as needed for wheezing or shortness of breath.   aspirin 81 MG tablet Take 81 mg by mouth daily.   baclofen 10 MG tablet Commonly known as: LIORESAL Take 1 tablet (10 mg total) by mouth at bedtime as needed for muscle spasms.   CALCIUM-MAGNESIUM-ZINC-D3 PO Take 1 tablet by mouth daily.   clobetasol cream 0.05 % Commonly known as: TEMOVATE Apply topically 2 (two) times daily.   diphenhydrAMINE 25 MG tablet Commonly known as: BENADRYL Take  25 mg by mouth daily as needed (allergies).   famotidine 20 MG tablet Commonly known as: Pepcid Take 1 tablet (20 mg total) by mouth 2 (two) times daily.   HYDROcodone-acetaminophen 5-325 MG tablet Commonly known as: Norco Take 1-2 tablets by mouth every 6 (six) hours as needed.   ibuprofen 200 MG tablet Commonly known as: ADVIL Take 800 mg by mouth 2 (two) times daily.   losartan-hydrochlorothiazide 100-25 MG tablet Commonly known as: HYZAAR Take 1 tablet by mouth daily.   magnesium hydroxide 400 MG/5ML  suspension Commonly known as: MILK OF MAGNESIA Take by mouth as needed for mild constipation.   mirabegron ER 25 MG Tb24 tablet Commonly known as: Myrbetriq Take 1 tablet (25 mg total) by mouth daily. Started by: Worthy Rancher, MD   multivitamin tablet Take 1 tablet by mouth daily.   nystatin cream Commonly known as: MYCOSTATIN Apply 1 application topically daily.   Potassium 99 MG Tabs Take 1 tablet by mouth daily.   predniSONE 10 MG tablet Commonly known as: DELTASONE Take 2 tablets (20 mg total) by mouth 2 (two) times daily with a meal.   rOPINIRole 2 MG tablet Commonly known as: REQUIP Take 1 tablet (2 mg total) by mouth at bedtime. For leg cramps   SYSTANE OP Place 1 drop into both eyes daily as needed (dry eyes).   vitamin C 1000 MG tablet Take 1,000 mg by mouth daily.   Vitamin D-3 25 MCG (1000 UT) Caps Take 2,000 Units by mouth daily.   zinc gluconate 50 MG tablet Take 50 mg by mouth daily.        Objective:   BP 139/75   Pulse 85   Ht 5\' 5"  (1.651 m)   Wt 226 lb (102.5 kg)   SpO2 96%   BMI 37.61 kg/m   Wt Readings from Last 3 Encounters:  06/25/20 226 lb (102.5 kg)  06/19/20 229 lb (103.9 kg)  05/29/20 229 lb 6.4 oz (104.1 kg)    Physical Exam Vitals and nursing note reviewed.  Constitutional:      General: She is not in acute distress.    Appearance: She is well-developed. She is not diaphoretic.  Eyes:     Conjunctiva/sclera: Conjunctivae normal.  Cardiovascular:     Rate and Rhythm: Normal rate and regular rhythm.     Heart sounds: Normal heart sounds. No murmur heard.   Pulmonary:     Effort: Pulmonary effort is normal. No respiratory distress.     Breath sounds: Normal breath sounds. No wheezing.  Musculoskeletal:        General: Swelling (1+ in bilateral lower extremities) present.  Skin:    General: Skin is warm and dry.     Findings: No rash.  Neurological:     Mental Status: She is alert and oriented to person,  place, and time.     Coordination: Coordination normal.  Psychiatric:        Behavior: Behavior normal.       Assessment & Plan:   Problem List Items Addressed This Visit      Cardiovascular and Mediastinum   Essential hypertension, benign - Primary     Digestive   GERD (gastroesophageal reflux disease)     Other   Severe obesity (BMI 35.0-39.9) with comorbidity ([place])    Other Visit Diagnoses    OAB (overactive bladder)       Relevant Medications   mirabegron ER (MYRBETRIQ) 25 MG TB24 tablet  Will start Myrbetriq for her bladder issues and continue other medicines except for we will stop the amlodipine to see if her swelling gets better. Follow up plan: Return in about 3 months (around 09/25/2020), or if symptoms worsen or fail to improve, for Hypertension and OAB and GERD.  Counseling provided for all of the vaccine components No orders of the defined types were placed in this encounter.   Caryl Pina, MD [facility] 06/25/2020, 1:47 PM

## 2020-07-17 ENCOUNTER — Ambulatory Visit: Payer: Medicare Other | Admitting: Orthopaedic Surgery

## 2020-07-31 ENCOUNTER — Other Ambulatory Visit: Payer: Self-pay

## 2020-07-31 ENCOUNTER — Encounter: Payer: Self-pay | Admitting: Family Medicine

## 2020-07-31 ENCOUNTER — Ambulatory Visit (INDEPENDENT_AMBULATORY_CARE_PROVIDER_SITE_OTHER): Payer: Medicare Other | Admitting: Family Medicine

## 2020-07-31 VITALS — BP 168/75 | HR 72 | Ht 65.0 in | Wt 226.0 lb

## 2020-07-31 DIAGNOSIS — L249 Irritant contact dermatitis, unspecified cause: Secondary | ICD-10-CM

## 2020-07-31 MED ORDER — TRIAMCINOLONE ACETONIDE 0.1 % EX CREA: 1.0000 "application " | TOPICAL_CREAM | Freq: Two times a day (BID) | CUTANEOUS | 0 refills | Status: DC

## 2020-07-31 NOTE — Progress Notes (Signed)
BP (!) 168/75   Pulse 72   Ht 5\' 5"  (1.651 m)   Wt 226 lb (102.5 kg)   SpO2 96%   BMI 37.61 kg/m    Subjective:   Patient ID: Anna Bradford, female    DOB: 11-29-1933, 87 y.o.   MRN: [id number]  HPI: Anna Bradford is a 87 y.o. female presenting on 07/31/2020 for Rash (BLE/ red raised, blistered, burning)   HPI Patient is coming in today complaining of rash that has been going on for the past 2 days.  She says she started up on the day after she was mowing and doing some other things but does not know anything specific that she came in contact with.  She says it started as a few small bumps on her left shin but now she has some on her right ankle and shin as well.  She says has had clear drainage but no purulence.  She denies any fevers or chills.  She says they are very irritated and pruritic.  She has used calamine lotion and Benadryl and they have helped but then it also spread to the other legs.  Relevant past medical, surgical, family and social history reviewed and updated as indicated. Interim medical history since our last visit reviewed. Allergies and medications reviewed and updated.  Review of Systems  Constitutional:  Negative for fever.  Eyes:  Negative for visual disturbance.  Respiratory:  Negative for chest tightness and shortness of breath.   Cardiovascular:  Negative for chest pain and leg swelling.  Skin:  Positive for rash. Negative for color change.  Neurological:  Negative for light-headedness and headaches.  Psychiatric/Behavioral:  Negative for agitation and behavioral problems.   All other systems reviewed and are negative.  Per HPI unless specifically indicated above   Allergies as of 07/31/2020       Reactions   Macrolides And Ketolides    Bleeding thru bowels   Codeine Nausea And Vomiting   Sulfa Antibiotics Nausea And Vomiting        Medication List        Accurate as of July 31, 2020 11:53 AM. If you have any questions, ask your nurse or  doctor.          STOP taking these medications    predniSONE 10 MG tablet Commonly known as: DELTASONE Stopped by: Fransisca Kaufmann Leanard Dimaio, MD       TAKE these medications    albuterol 108 (90 Base) MCG/ACT inhaler Commonly known as: VENTOLIN HFA Inhale 2 puffs into the lungs every 6 (six) hours as needed for wheezing or shortness of breath.   aspirin 81 MG tablet Take 81 mg by mouth daily.   baclofen 10 MG tablet Commonly known as: LIORESAL Take 1 tablet (10 mg total) by mouth at bedtime as needed for muscle spasms.   CALCIUM-MAGNESIUM-ZINC-D3 PO Take 1 tablet by mouth daily.   clobetasol cream 0.05 % Commonly known as: TEMOVATE Apply topically 2 (two) times daily.   diphenhydrAMINE 25 MG tablet Commonly known as: BENADRYL Take 25 mg by mouth daily as needed (allergies).   famotidine 20 MG tablet Commonly known as: Pepcid Take 1 tablet (20 mg total) by mouth 2 (two) times daily.   HYDROcodone-acetaminophen 5-325 MG tablet Commonly known as: Norco Take 1-2 tablets by mouth every 6 (six) hours as needed.   ibuprofen 200 MG tablet Commonly known as: ADVIL Take 800 mg by mouth 2 (two) times daily.   losartan-hydrochlorothiazide 100-25  MG tablet Commonly known as: HYZAAR Take 1 tablet by mouth daily.   magnesium hydroxide 400 MG/5ML suspension Commonly known as: MILK OF MAGNESIA Take by mouth as needed for mild constipation.   mirabegron ER 25 MG Tb24 tablet Commonly known as: Myrbetriq Take 1 tablet (25 mg total) by mouth daily.   multivitamin tablet Take 1 tablet by mouth daily.   nystatin cream Commonly known as: MYCOSTATIN Apply 1 application topically daily.   Potassium 99 MG Tabs Take 1 tablet by mouth daily.   rOPINIRole 2 MG tablet Commonly known as: REQUIP Take 1 tablet (2 mg total) by mouth at bedtime. For leg cramps   SYSTANE OP Place 1 drop into both eyes daily as needed (dry eyes).   triamcinolone cream 0.1 % Commonly known as:  KENALOG Apply 1 application topically 2 (two) times daily. Started by: Worthy Rancher, MD   vitamin C 1000 MG tablet Take 1,000 mg by mouth daily.   Vitamin D-3 25 MCG (1000 UT) Caps Take 2,000 Units by mouth daily.   zinc gluconate 50 MG tablet Take 50 mg by mouth daily.         Objective:   BP (!) 168/75   Pulse 72   Ht 5\' 5"  (1.651 m)   Wt 226 lb (102.5 kg)   SpO2 96%   BMI 37.61 kg/m   Wt Readings from Last 3 Encounters:  07/31/20 226 lb (102.5 kg)  06/25/20 226 lb (102.5 kg)  06/19/20 229 lb (103.9 kg)    Physical Exam Vitals and nursing note reviewed.  Constitutional:      General: She is not in acute distress.    Appearance: She is well-developed. She is not diaphoretic.  Skin:    General: Skin is warm and dry.     Findings: Rash (Small pink papules on anterior left shin, about 5 and about 2 on right ankle and shin.  No erythema or warmth, slightly clear drainage with crusting on some of the lesions.) present.  Neurological:     Mental Status: She is alert.  Psychiatric:        Behavior: Behavior normal.      Assessment & Plan:   Problem List Items Addressed This Visit   None Visit Diagnoses     Irritant contact dermatitis, unspecified trigger    -  Primary   Relevant Medications   triamcinolone cream (KENALOG) 0.1 %       Recommended continue Benadryl and calamine and do topical triamcinolone for a week or 2.  If not improved or does not resolve then please call us back. Follow up plan: Return if symptoms worsen or fail to improve.  Counseling provided for all of the vaccine components No orders of the defined types were placed in this encounter.   Caryl Pina, MD [facility] 07/31/2020, 11:53 AM

## 2020-07-31 NOTE — Progress Notes (Signed)
Chief Complaint: rash on both legs   History of Present Illness:  Anna Bradford is a 87 year old who presents today with a rash on both of her shins. She first noticed a small bump on her left shin 2 days ago after mowing the lawn the day before. The bump was itchy and she experienced a burning sensation up and down the leg around the spot. 1 day ago she noticed 4 more bumps near the first one on the left shin. Later in the day yesterday she also noticed a new bump on her right ankle. Today she has 3 more smaller bumps on the right shin. Calamine lotion and benadryl help the itchy and burning sensation. Nothing in particular seems to make it worse. There are no other bumps or rashes anywhere else on her body. She denies any headache, changes in bowel or bladder, or numbness and tingling in her extremities. She mentioned that she has had poison ivy/oak before and this rash is not the same that she experienced with that. The only event out of ordinary for her was mowing the lawn 3 days ago ([employer]) and she also mentioned that she picked some milkweed (although she has done this before and did not have a reaction to it).   Allergies:  No known food or environmental allergies   ROS: General: no headache, normal appetite HEENT: no sore throat  Pulmonary: no cough CV: no chest pain Skin: per HPI - itchy, burning papules on left and right shins  Neuro: no tingling or numbness    Objective:  PE: Vital signs - BP: 168/75, P:72 SpO2:96% room air. General: not in acute distress, conversant, normal work of breathing MSK:  Right leg: 5 mix pink pruritic papules and vesicles and on shin. Some excoriated and other containing clear liquid. Left leg: 1 excoriated pink papule on ankle, 2-3 smaller pink papules on shin right above ankle.    Assessment/Plan: Anna Bradford is a 87 year old presenting with itchy pink papules on her left and right shins. The likely diagnosis is contact dermatitis: She was outside  mowing the lawn and picked milkweed 3 days ago and the rash started developing 24 hours later. Rash contains pruritic papules consistent with contact dermatitis. Could have been a grass or weed that came in contact with her shin while mowing. Rash does not look like a poison ivy/oak rash. She is already taking benadryl and using calamine lotion. Please also try topical triamcinolone cream twice a day for the next week. Return to clinic rash continues to spread or gets worse.

## 2020-09-09 ENCOUNTER — Encounter: Payer: Self-pay | Admitting: General Surgery

## 2020-09-09 ENCOUNTER — Other Ambulatory Visit: Payer: Self-pay

## 2020-09-09 ENCOUNTER — Ambulatory Visit: Payer: Medicare Other | Admitting: General Surgery

## 2020-09-09 VITALS — BP 186/79 | HR 80 | Temp 98.9°F | Resp 16 | Ht 65.0 in | Wt 226.0 lb

## 2020-09-09 DIAGNOSIS — D171 Benign lipomatous neoplasm of skin and subcutaneous tissue of trunk: Secondary | ICD-10-CM | POA: Insufficient documentation

## 2020-09-09 NOTE — Progress Notes (Signed)
[facility] History and Physical   Chief Complaint   Other     Anna Bradford is a 87 y.o. female.  HPI: Anna Bradford is known to me and had right side and flank lipomas removed a few years ago. She has new ones and they are causing her some pain. She has pain when she moves and when she tries to wipe after using the restroom. She wants to get these areas excised too. She says that her pain improved with the last excision. She is otherwise in the same state of health. She does not take any blood thinners.   She does not think the areas are growing.   Past Medical History:  Diagnosis Date   Arthritis    Cancer [facility]) 1996   left breast   Essential hypertension, benign    GERD (gastroesophageal reflux disease)     Past Surgical History:  Procedure Laterality Date   ABDOMINAL HYSTERECTOMY     Absess Off Intestine     cataract surgery Left 08-10-2011   cataract surgery Right 11-01-2011   CHOLECYSTECTOMY     1972   COLONOSCOPY N/A 02/06/2015   Procedure: COLONOSCOPY;  Surgeon: Rogene Houston, MD;  Location: [facility] SUITE;  Service: Endoscopy;  Laterality: N/A;  1015   GALLBLADDER SURGERY     Rupture Lower Stomach   HERNIA REPAIR     Hysterectomy ---- unknown     1980   Lapoma Tumor     Left Breast Masectomy     1986   MASS EXCISION N/A 01/30/2018   Procedure: EXCISION MASS 3CM ON BACK AND 3CM ON ABDOMEN;  Surgeon: Virl Cagey, MD;  Location: [facility] ORS;  Service: General;  Laterality: N/A;   Right Ankle Surgery      Family History  Problem Relation Age of Onset   Cancer Mother    Coronary artery disease Mother    Heart disease Sister     Social History   Tobacco Use   Smoking status: Never   Smokeless tobacco: Never  Vaping Use   Vaping Use: Never used  Substance Use Topics   Alcohol use: No   Drug use: No    Medications: I have reviewed the patient's current medications. Allergies as of 09/09/2020       Reactions   Macrolides And Ketolides     Bleeding thru bowels   Codeine Nausea And Vomiting   Sulfa Antibiotics Nausea And Vomiting        Medication List        Accurate as of September 09, 2020 10:16 AM. If you have any questions, ask your nurse or doctor.          albuterol 108 (90 Base) MCG/ACT inhaler Commonly known as: VENTOLIN HFA Inhale 2 puffs into the lungs every 6 (six) hours as needed for wheezing or shortness of breath.   aspirin 81 MG tablet Take 81 mg by mouth daily.   baclofen 10 MG tablet Commonly known as: LIORESAL Take 1 tablet (10 mg total) by mouth at bedtime as needed for muscle spasms.   CALCIUM-MAGNESIUM-ZINC-D3 PO Take 1 tablet by mouth daily.   clobetasol cream 0.05 % Commonly known as: TEMOVATE Apply topically 2 (two) times daily.   diphenhydrAMINE 25 MG tablet Commonly known as: BENADRYL Take 25 mg by mouth daily as needed (allergies).   famotidine 20 MG tablet Commonly known as: Pepcid Take 1 tablet (20 mg total) by mouth 2 (two) times daily.  HYDROcodone-acetaminophen 5-325 MG tablet Commonly known as: Norco Take 1-2 tablets by mouth every 6 (six) hours as needed.   ibuprofen 200 MG tablet Commonly known as: ADVIL Take 800 mg by mouth 2 (two) times daily.   losartan-hydrochlorothiazide 100-25 MG tablet Commonly known as: HYZAAR Take 1 tablet by mouth daily.   magnesium hydroxide 400 MG/5ML suspension Commonly known as: MILK OF MAGNESIA Take by mouth as needed for mild constipation.   mirabegron ER 25 MG Tb24 tablet Commonly known as: Myrbetriq Take 1 tablet (25 mg total) by mouth daily.   multivitamin tablet Take 1 tablet by mouth daily.   nystatin cream Commonly known as: MYCOSTATIN Apply 1 application topically daily.   Potassium 99 MG Tabs Take 1 tablet by mouth daily.   rOPINIRole 2 MG tablet Commonly known as: REQUIP Take 1 tablet (2 mg total) by mouth at bedtime. For leg cramps   SYSTANE OP Place 1 drop into both eyes daily as needed (dry  eyes).   triamcinolone cream 0.1 % Commonly known as: KENALOG Apply 1 application topically 2 (two) times daily.   vitamin C 1000 MG tablet Take 1,000 mg by mouth daily.   Vitamin D-3 25 MCG (1000 UT) Caps Take 2,000 Units by mouth daily.   zinc gluconate 50 MG tablet Take 50 mg by mouth daily.         ROS:  A comprehensive review of systems was negative except for: Musculoskeletal: positive for back pain and pain on the flank on the right at lipomas  Blood pressure (!) 186/79, pulse 80, temperature 98.9 F (37.2 C), temperature source Other (Comment), resp. rate 16, height '5\' 5"'$  (1.651 m), weight 226 lb (102.5 kg), SpO2 95 %. Physical Exam Vitals reviewed.  Constitutional:      Appearance: She is obese.  HENT:     Head: Normocephalic.     Nose: Nose normal.     Mouth/Throat:     Mouth: Mucous membranes are moist.  Eyes:     Extraocular Movements: Extraocular movements intact.  Cardiovascular:     Rate and Rhythm: Normal rate and regular rhythm.  Pulmonary:     Effort: Pulmonary effort is normal.  Abdominal:     General: There is no distension.     Palpations: Abdomen is soft.     Tenderness: There is no abdominal tenderness.  Musculoskeletal:     Cervical back: Normal range of motion.  Skin:    General: Skin is warm.  Neurological:     General: No focal deficit present.     Mental Status: She is alert and oriented to person, place, and time.  Psychiatric:        Mood and Affect: Mood normal.        Behavior: Behavior normal.        Thought Content: Thought content normal.        Judgment: Judgment normal.    Results: None   Assessment & Plan:  Anna Bradford is a 87 y.o. female with new lipomas on her right flank and back causing some pain. Discussed that the pain may not resolve with excision. Discussed excision in the office with local given that they are small and superficial. Discussed risk of bleeding, infection recurrence.  Excision in the office  in the upcoming weeks Future Appointments  Date Time Provider [place]  09/25/2020  3:25 PM Dettinger, Fransisca Kaufmann, MD [facility] None  10/02/2020  2:45 PM Virl Cagey, MD [facility] None  11/10/2020  1:15 PM [facility]-ANNUAL WELLNESS VISIT [facility] None     All questions were answered to the satisfaction of the patient.    Virl Cagey 09/09/2020, 10:16 AM

## 2020-09-09 NOTE — Patient Instructions (Signed)
Will get you scheduled for lipoma excision in the office with local numbing medicine.   Lipoma  A lipoma is a noncancerous (benign) tumor that is made up of fat cells. This is a very common type of soft-tissue growth. Lipomas are usually found under the skin (subcutaneous). They may occur in any tissue of the body that contains fat. Common areas forlipomas to appear include the back, arms, shoulders, buttocks, and thighs. Lipomas grow slowly, and they are usually painless. Most lipomas do not causeproblems and do not require treatment. What are the causes? The cause of this condition is not known. What increases the risk? You are more likely to develop this condition if: You are 35-53 years old. You have a family history of lipomas. What are the signs or symptoms? A lipoma usually appears as a small, round bump under the skin. In most cases, the lump will: Feel soft or rubbery. Not cause pain or other symptoms. However, if a lipoma is located in an area where it pushes on nerves, it canbecome painful or cause other symptoms. How is this diagnosed? A lipoma can usually be diagnosed with a physical exam. You may also have tests to confirm the diagnosis and to rule out other conditions. Tests may include: Imaging tests, such as a CT scan or an MRI. Removal of a tissue sample to be looked at under a microscope (biopsy). How is this treated? Treatment for this condition depends on the size of the lipoma and whether it is causing any symptoms. For small lipomas that are not causing problems, no treatment is needed. If a lipoma is bigger or it causes problems, surgery may be done to remove the lipoma. Lipomas can also be removed to improve appearance. Most often, the procedure is done after applying a medicine that numbs the area (local anesthetic). Liposuction may be done to reduce the size of the lipoma before it is removed through surgery, or it may be done to remove the lipoma. Lipomas are  removed with this method in order to limit incision size and scarring. A liposuction tube is inserted through a small incision into the lipoma, and the contents of the lipoma are removed through the tube with suction. Follow these instructions at home: Watch your lipoma for any changes. Keep all follow-up visits as told by your health care provider. This is important. Contact a health care provider if: Your lipoma becomes larger or hard. Your lipoma becomes painful, red, or increasingly swollen. These could be signs of infection or a more serious condition. Get help right away if: You develop tingling or numbness in an area near the lipoma. This could indicate that the lipoma is causing nerve damage. Summary A lipoma is a noncancerous tumor that is made up of fat cells. Most lipomas do not cause problems and do not require treatment. If a lipoma is bigger or it causes problems, surgery may be done to remove the lipoma. Contact a health care provider if your lipoma becomes larger or hard, or if it becomes painful, red, or increasingly swollen. Pain, redness, and swelling could be signs of infection or a more serious condition. This information is not intended to replace advice given to you by your health care provider. Make sure you discuss any questions you have with your healthcare provider. Document Revised: 08/21/2018 Document Reviewed: 08/21/2018 Elsevier Patient Education  [place].

## 2020-09-10 ENCOUNTER — Other Ambulatory Visit: Payer: Self-pay | Admitting: Family Medicine

## 2020-09-10 DIAGNOSIS — I10 Essential (primary) hypertension: Secondary | ICD-10-CM

## 2020-09-23 ENCOUNTER — Other Ambulatory Visit: Payer: Self-pay

## 2020-09-23 ENCOUNTER — Telehealth: Payer: Self-pay | Admitting: Family Medicine

## 2020-09-23 DIAGNOSIS — I10 Essential (primary) hypertension: Secondary | ICD-10-CM

## 2020-09-23 NOTE — Telephone Encounter (Signed)
Future lab orders placed

## 2020-09-25 ENCOUNTER — Ambulatory Visit (INDEPENDENT_AMBULATORY_CARE_PROVIDER_SITE_OTHER): Payer: Medicare Other | Admitting: Family Medicine

## 2020-09-25 ENCOUNTER — Other Ambulatory Visit: Payer: Self-pay | Admitting: Family Medicine

## 2020-09-25 ENCOUNTER — Encounter: Payer: Self-pay | Admitting: Family Medicine

## 2020-09-25 ENCOUNTER — Other Ambulatory Visit: Payer: Self-pay

## 2020-09-25 VITALS — BP 154/75 | HR 81 | Ht 65.0 in | Wt 228.0 lb

## 2020-09-25 DIAGNOSIS — M810 Age-related osteoporosis without current pathological fracture: Secondary | ICD-10-CM

## 2020-09-25 DIAGNOSIS — R110 Nausea: Secondary | ICD-10-CM | POA: Diagnosis not present

## 2020-09-25 DIAGNOSIS — I10 Essential (primary) hypertension: Secondary | ICD-10-CM

## 2020-09-25 DIAGNOSIS — K219 Gastro-esophageal reflux disease without esophagitis: Secondary | ICD-10-CM

## 2020-09-25 MED ORDER — FAMOTIDINE 20 MG PO TABS: 20.0000 mg | ORAL_TABLET | Freq: Two times a day (BID) | ORAL | 3 refills | Status: DC

## 2020-09-25 MED ORDER — LOSARTAN POTASSIUM-HCTZ 100-25 MG PO TABS: 1.0000 | ORAL_TABLET | Freq: Every day | ORAL | 3 refills | Status: DC

## 2020-09-25 NOTE — Progress Notes (Signed)
BP (!) 154/75   Pulse 81   Ht '5\' 5"'  (1.651 m)   Wt 228 lb (103.4 kg)   SpO2 96%   BMI 37.94 kg/m    Subjective:   Patient ID: Anna Bradford, female    DOB: May 30, 1933, 87 y.o.   MRN: [id number]  HPI: Anna Bradford is a 87 y.o. female presenting on 09/25/2020 for Medical Management of Chronic Issues and Hypertension   HPI Hypertension Patient is currently on amlodipine 5 mg and losartan hydrochlorothiazide, and their blood pressure today is 154/75. Patient denies any lightheadedness or dizziness. Patient denies headaches, blurred vision, chest pains, shortness of breath, or weakness. Denies any side effects from medication and is content with current medication.   Hyperlipidemia Patient is coming in for recheck of his hyperlipidemia. The patient is currently taking no medication currently, has been intolerant of statins previously. They deny any issues with myalgias or history of liver damage from it. They deny any focal numbness or weakness or chest pain.   GERD Patient is currently on famotidine.  She denies any major symptoms or abdominal pain or belching or burping. She denies any blood in her stool or lightheadedness or dizziness.   Osteoporosis/osteopenia Fractures or history of fracture: None Medication: Calcium and vitamin D Duration of treatment: Past couple years Last bone density scan: 1 year ago Last T score: -2.6  Relevant past medical, surgical, family and social history reviewed and updated as indicated. Interim medical history since our last visit reviewed. Allergies and medications reviewed and updated.  Review of Systems  Constitutional:  Negative for chills and fever.  HENT:  Negative for congestion, ear discharge and ear pain.   Eyes:  Negative for visual disturbance.  Respiratory:  Negative for chest tightness and shortness of breath.   Cardiovascular:  Negative for chest pain and leg swelling.  Genitourinary:  Negative for difficulty urinating and dysuria.   Musculoskeletal:  Negative for back pain and gait problem.  Skin:  Negative for rash.  Neurological:  Negative for light-headedness and headaches.  Psychiatric/Behavioral:  Negative for agitation and behavioral problems.   All other systems reviewed and are negative.  Per HPI unless specifically indicated above   Allergies as of 09/25/2020       Reactions   Macrolides And Ketolides    Bleeding thru bowels   Codeine Nausea And Vomiting   Sulfa Antibiotics Nausea And Vomiting        Medication List        Accurate as of September 25, 2020  3:59 PM. If you have any questions, ask your nurse or doctor.          albuterol 108 (90 Base) MCG/ACT inhaler Commonly known as: VENTOLIN HFA Inhale 2 puffs into the lungs every 6 (six) hours as needed for wheezing or shortness of breath.   amLODipine 5 MG tablet Commonly known as: NORVASC Take 5 mg by mouth daily.   aspirin 81 MG tablet Take 81 mg by mouth daily.   baclofen 10 MG tablet Commonly known as: LIORESAL Take 1 tablet (10 mg total) by mouth at bedtime as needed for muscle spasms.   CALCIUM-MAGNESIUM-ZINC-D3 PO Take 1 tablet by mouth daily.   clobetasol cream 0.05 % Commonly known as: TEMOVATE Apply topically 2 (two) times daily.   diphenhydrAMINE 25 MG tablet Commonly known as: BENADRYL Take 25 mg by mouth daily as needed (allergies).   famotidine 20 MG tablet Commonly known as: Pepcid Take 1 tablet (20  mg total) by mouth 2 (two) times daily.   HYDROcodone-acetaminophen 5-325 MG tablet Commonly known as: Norco Take 1-2 tablets by mouth every 6 (six) hours as needed.   ibuprofen 200 MG tablet Commonly known as: ADVIL Take 800 mg by mouth 2 (two) times daily.   losartan-hydrochlorothiazide 100-25 MG tablet Commonly known as: HYZAAR Take 1 tablet by mouth daily.   magnesium hydroxide 400 MG/5ML suspension Commonly known as: MILK OF MAGNESIA Take by mouth as needed for mild constipation.   mirabegron  ER 25 MG Tb24 tablet Commonly known as: Myrbetriq Take 1 tablet (25 mg total) by mouth daily.   multivitamin tablet Take 1 tablet by mouth daily.   nystatin cream Commonly known as: MYCOSTATIN Apply 1 application topically daily.   Potassium 99 MG Tabs Take 1 tablet by mouth daily.   rOPINIRole 2 MG tablet Commonly known as: REQUIP Take 1 tablet (2 mg total) by mouth at bedtime. For leg cramps   SYSTANE OP Place 1 drop into both eyes daily as needed (dry eyes).   triamcinolone cream 0.1 % Commonly known as: KENALOG Apply 1 application topically 2 (two) times daily.   vitamin C 1000 MG tablet Take 1,000 mg by mouth daily.   Vitamin D-3 25 MCG (1000 UT) Caps Take 2,000 Units by mouth daily.   zinc gluconate 50 MG tablet Take 50 mg by mouth daily.         Objective:   BP (!) 154/75   Pulse 81   Ht '5\' 5"'  (1.651 m)   Wt 228 lb (103.4 kg)   SpO2 96%   BMI 37.94 kg/m   Wt Readings from Last 3 Encounters:  09/25/20 228 lb (103.4 kg)  09/09/20 226 lb (102.5 kg)  07/31/20 226 lb (102.5 kg)    Physical Exam Vitals and nursing note reviewed.  Constitutional:      General: She is not in acute distress.    Appearance: She is well-developed. She is not diaphoretic.  Eyes:     Conjunctiva/sclera: Conjunctivae normal.  Cardiovascular:     Rate and Rhythm: Normal rate and regular rhythm.     Heart sounds: Normal heart sounds. No murmur heard. Pulmonary:     Effort: Pulmonary effort is normal. No respiratory distress.     Breath sounds: Normal breath sounds. No wheezing.  Musculoskeletal:        General: No tenderness. Normal range of motion.  Skin:    General: Skin is warm and dry.     Findings: No rash.  Neurological:     Mental Status: She is alert and oriented to person, place, and time.     Coordination: Coordination normal.  Psychiatric:        Behavior: Behavior normal.      Assessment & Plan:   Problem List Items Addressed This Visit        Cardiovascular and Mediastinum   Essential hypertension, benign - Primary   Relevant Medications   amLODipine (NORVASC) 5 MG tablet   losartan-hydrochlorothiazide (HYZAAR) 100-25 MG tablet   Other Relevant Orders   CBC with Differential/Platelet   CMP14+EGFR   Lipid panel     Digestive   GERD (gastroesophageal reflux disease)   Relevant Medications   famotidine (PEPCID) 20 MG tablet     Musculoskeletal and Integument   Osteoporosis without current pathological fracture   Relevant Orders   VITAMIN D 25 Hydroxy (Vit-D Deficiency, Fractures)   Other Visit Diagnoses     Nausea  Relevant Medications   famotidine (PEPCID) 20 MG tablet       Will check blood work, continue amlodipine and losartan hydrochlorothiazide for blood pressure, will monitor closely.  Otherwise seems to be doing well no changes to medication. Follow up plan: Return in about 3 months (around 12/25/2020), or if symptoms worsen or fail to improve, for Hypertension and GERD and osteoporosis recheck.  Counseling provided for all of the vaccine components Orders Placed This Encounter  Procedures   CBC with Differential/Platelet   CMP14+EGFR   Lipid panel   VITAMIN D 25 Hydroxy (Vit-D Deficiency, Fractures)    Caryl Pina, MD [facility] 09/25/2020, 3:59 PM

## 2020-09-26 LAB — CMP14+EGFR
ALT: 21 IU/L (ref 0–32)
AST: 23 IU/L (ref 0–40)
Albumin/Globulin Ratio: 1.8 (ref 1.2–2.2)
Albumin: 4 g/dL (ref 3.6–4.6)
Alkaline Phosphatase: 80 IU/L (ref 44–121)
BUN/Creatinine Ratio: 22 (ref 12–28)
BUN: 18 mg/dL (ref 8–27)
Bilirubin Total: 0.3 mg/dL (ref 0.0–1.2)
CO2: 28 mmol/L (ref 20–29)
Calcium: 8.7 mg/dL (ref 8.7–10.3)
Chloride: 102 mmol/L (ref 96–106)
Creatinine, Ser: 0.83 mg/dL (ref 0.57–1.00)
Globulin, Total: 2.2 g/dL (ref 1.5–4.5)
Glucose: 102 mg/dL — ABNORMAL HIGH (ref 65–99)
Potassium: 3.8 mmol/L (ref 3.5–5.2)
Sodium: 142 mmol/L (ref 134–144)
Total Protein: 6.2 g/dL (ref 6.0–8.5)
eGFR: 69 mL/min/{1.73_m2} (ref >59)

## 2020-09-26 LAB — CBC WITH DIFFERENTIAL/PLATELET
Basophils Absolute: 0.1 10*3/uL (ref 0.0–0.2)
Basos: 1 %
EOS (ABSOLUTE): 0.3 10*3/uL (ref 0.0–0.4)
Eos: 4 %
Hematocrit: 40.6 % (ref 34.0–46.6)
Hemoglobin: 13.6 g/dL (ref 11.1–15.9)
Immature Grans (Abs): 0 10*3/uL (ref 0.0–0.1)
Immature Granulocytes: 0 %
Lymphocytes Absolute: 2 10*3/uL (ref 0.7–3.1)
Lymphs: 28 %
MCH: 30.4 pg (ref 26.6–33.0)
MCHC: 33.5 g/dL (ref 31.5–35.7)
MCV: 91 fL (ref 79–97)
Monocytes Absolute: 0.8 10*3/uL (ref 0.1–0.9)
Monocytes: 11 %
Neutrophils Absolute: 4.1 10*3/uL (ref 1.4–7.0)
Neutrophils: 56 %
Platelets: 233 10*3/uL (ref 150–450)
RBC: 4.48 x10E6/uL (ref 3.77–5.28)
RDW: 13.1 % (ref 11.7–15.4)
WBC: 7.3 10*3/uL (ref 3.4–10.8)

## 2020-09-26 LAB — VITAMIN D 25 HYDROXY (VIT D DEFICIENCY, FRACTURES): Vit D, 25-Hydroxy: 57.9 ng/mL (ref 30.0–100.0)

## 2020-09-26 LAB — LIPID PANEL
Chol/HDL Ratio: 5 ratio — ABNORMAL HIGH (ref 0.0–4.4)
Cholesterol, Total: 164 mg/dL (ref 100–199)
HDL: 33 mg/dL — ABNORMAL LOW (ref >39)
LDL Chol Calc (NIH): 93 mg/dL (ref 0–99)
Triglycerides: 226 mg/dL — ABNORMAL HIGH (ref 0–149)
VLDL Cholesterol Cal: 38 mg/dL (ref 5–40)

## 2020-10-02 ENCOUNTER — Ambulatory Visit: Payer: Medicare Other | Admitting: General Surgery

## 2020-10-02 ENCOUNTER — Other Ambulatory Visit: Payer: Self-pay

## 2020-10-02 ENCOUNTER — Encounter: Payer: Self-pay | Admitting: General Surgery

## 2020-10-02 VITALS — BP 147/78 | HR 75 | Temp 98.4°F | Resp 18 | Ht 65.0 in | Wt 227.0 lb

## 2020-10-02 DIAGNOSIS — D171 Benign lipomatous neoplasm of skin and subcutaneous tissue of trunk: Secondary | ICD-10-CM

## 2020-10-02 NOTE — Patient Instructions (Signed)
Lipoma Removal, Care After This sheet gives you information about how to care for yourself after your procedure. Your health care provider may also give you more specific instructions. If you have problems or questions, contact your health care provider. What can I expect after the procedure? After the procedure, it is common to have: Mild pain. Swelling. Bruising. Follow these instructions at home: Bathing  Do not take baths, swim, or use a hot tub until your health care provider approves.  You may shower. Keep your bandage (dressing) dry until your health care provider says it can be removed. Incision care  Follow instructions from your health care provider about how to take care of your incision. Make sure you: Wash your hands with soap and water for at least 20 seconds before and after you change your dressing. If soap and water are not available, use hand sanitizer. Change your dressing as told by your health care provider. Leave stitches (sutures), skin glue, or adhesive strips in place. These skin closures may need to stay in place for 2 weeks or longer. If adhesive strip edges start to loosen and curl up, you may trim the loose edges. Do not remove adhesive strips completely unless your health care provider tells you to do that. Check your incision area every day for signs of infection. Check for: More redness, swelling, or pain. Fluid or blood. Warmth. Pus or a bad smell. Medicines Take over-the-counter and prescription medicines only as told by your health care provider. If you were prescribed an antibiotic medicine, use it as told by your health care provider. Do not stop using the antibiotic even if you start to feel better. General instructions  If you were given a sedative during the procedure, it can affect you for several hours. Do not drive or operate machinery until your health care provider says that it is safe. Do not use any products that contain nicotine or  tobacco, such as cigarettes, e-cigarettes, and chewing tobacco. These can delay healing. If you need help quitting, ask your health care provider. Return to your normal activities as told by your health care provider. Ask your health care provider what activities are safe for you. Keep all follow-up visits as told by your health care provider. This is important. Contact a health care provider if: You have more redness, swelling, or pain around your incision. You have fluid or blood coming from your incision. Your incision feels warm to the touch. You have pus or a bad smell coming from your incision. You have pain that does not get better with medicine. Get help right away if: You have chills or a fever. You have severe pain. Summary After the procedure, it is common to have mild pain, swelling, and bruising. Follow instructions from your health care provider about how to take care of your incision. Check your incision area every day for signs of infection. Contact a health care provider if you have more redness, swelling, or pain around your incision. This information is not intended to replace advice given to you by your health care provider. Make sure you discuss any questions you have with your health care provider. Document Revised: 08/21/2018 Document Reviewed: 08/21/2018 Elsevier Patient Education  [place].

## 2020-10-02 NOTE — Progress Notes (Signed)
R/C labs

## 2020-10-03 NOTE — Progress Notes (Signed)
[facility] Operative Note  10/03/20  Preoperative Diagnosis: Lipomas right flank    Postoperative Diagnosis: Same   Procedure(s) Performed: Excision of lipomas 3 cm    Surgeon: Lanell Matar. Constance Haw, MD   Anesthesia: 1% lidocaine    Specimens: Lipoma superior, lipoma inferior    Estimated Blood Loss: Minimal   Blood Replacement: None    Complications: None   Wound Class: Clean    Operative Indications: Anna Bradford is a 87 yo with multiple lipomas that are causing her discomfort. She had previous ones removed with improvement in her pain. She wants to proceed with excision. We discussed the risk of bleeding, infection, recurrence, finding something other than lipoma.   Findings: Lipomas    Procedure: The patient was taken to the procedure room and placed on her left side. Lidocaine 1% was injected over the lipomas. Two separate incisions were made and carried down into the fatty tissue. Lipomatous lesions were removed. The cavities were made hemostatic. The total size of the lipomas were 3 cm. Interrupted 3-0 Vicryl were used to close the incisions and the subcuticular incisions were closed with 4-0 Monocryl and dermabond.   Final inspection revealed acceptable hemostasis. All counts were correct at the end of the case. The patient tolerated the procedure well.    Anna Labrum, MD [facility] [address] [place], [place] [address] [phone] (office)

## 2020-10-07 ENCOUNTER — Ambulatory Visit (INDEPENDENT_AMBULATORY_CARE_PROVIDER_SITE_OTHER): Payer: Medicare Other | Admitting: Family Medicine

## 2020-10-07 ENCOUNTER — Encounter: Payer: Self-pay | Admitting: Family Medicine

## 2020-10-07 DIAGNOSIS — J029 Acute pharyngitis, unspecified: Secondary | ICD-10-CM

## 2020-10-07 DIAGNOSIS — R059 Cough, unspecified: Secondary | ICD-10-CM

## 2020-10-07 LAB — TISSUE PATH REPORT

## 2020-10-07 LAB — PATHOLOGY REPORT

## 2020-10-07 LAB — CULTURE, GROUP A STREP

## 2020-10-07 LAB — RAPID STREP SCREEN (MED CTR MEBANE ONLY): Strep Gp A Ag, IA W/Reflex: NEGATIVE

## 2020-10-07 NOTE — Progress Notes (Signed)
Virtual Visit via telephone Note Due to COVID-19 pandemic this visit was conducted virtually. This visit type was conducted due to national recommendations for restrictions regarding the COVID-19 Pandemic (e.g. social distancing, sheltering in place) in an effort to limit this patient's exposure and mitigate transmission in our community. All issues noted in this document were discussed and addressed.  A physical exam was not performed with this format.   I connected with Anna Bradford on 10/07/2020 at 1039 by telephone and verified that I am speaking with the correct person using two identifiers. Anna Bradford is currently located at home and  no one  is currently with them during visit. The provider, Monia Pouch, FNP is located in their office at time of visit.  I discussed the limitations, risks, security and privacy concerns of performing an evaluation and management service by telephone and the availability of in person appointments. I also discussed with the patient that there may be a patient responsible charge related to this service. The patient expressed understanding and agreed to proceed.  Subjective:  Patient ID: Anna Bradford, female    DOB: [birth year], 87 y.o.   MRN: [id number]  Chief Complaint:  Sore Throat   HPI: Anna Bradford is a 87 y.o. female presenting on 10/07/2020 for Sore Throat   Pt reports she has a significant sore throat with a slight cough and headache. States she was at a party on Saturday with several people. States no one at the event was sick. States she developed a sore throat yesterday which continues to worsen. She has taken tylenol and used throat sprays without relief of symptoms. She has not been able to see in the back of her throat. Denies lymphadenopathy.   Sore Throat  This is a new problem. The current episode started yesterday. The problem has been gradually worsening. Neither side of throat is experiencing more pain than the other. There has been no  fever. The pain is at a severity of 6/10. The pain is moderate. Associated symptoms include congestion, coughing, headaches and trouble swallowing. Pertinent negatives include no abdominal pain, diarrhea, drooling, ear discharge, ear pain, hoarse voice, plugged ear sensation, neck pain, shortness of breath, stridor, swollen glands or vomiting. She has tried acetaminophen for the symptoms. The treatment provided no relief.    Relevant past medical, surgical, family, and social history reviewed and updated as indicated.  Allergies and medications reviewed and updated.   Past Medical History:  Diagnosis Date   Arthritis    Cancer [facility]) 1996   left breast   Essential hypertension, benign    GERD (gastroesophageal reflux disease)     Past Surgical History:  Procedure Laterality Date   ABDOMINAL HYSTERECTOMY     Absess Off Intestine     cataract surgery Left 08-10-2011   cataract surgery Right 11-01-2011   CHOLECYSTECTOMY     1972   COLONOSCOPY N/A 02/06/2015   Procedure: COLONOSCOPY;  Surgeon: Rogene Houston, MD;  Location: [facility] ENDO SUITE;  Service: Endoscopy;  Laterality: N/A;  1015   GALLBLADDER SURGERY     Rupture Lower Stomach   HERNIA REPAIR     Hysterectomy ---- unknown     1980   Lapoma Tumor     Left Breast Masectomy     1986   MASS EXCISION N/A 01/30/2018   Procedure: EXCISION MASS 3CM ON BACK AND 3CM ON ABDOMEN;  Surgeon: Virl Cagey, MD;  Location: [facility] ORS;  Service: General;  Laterality:  N/A;   Right Ankle Surgery      Social History   Socioeconomic History   Marital status: Widowed    Spouse name: Not on file   Number of children: 2   Years of education: 12-failed the 4th grade and had to repeat   Highest education level: 11th grade  Occupational History   Occupation: retired  Tobacco Use   Smoking status: Never   Smokeless tobacco: Never  Vaping Use   Vaping Use: Never used  Substance and Sexual Activity   Alcohol use: No   Drug use: No   Sexual  activity: Not Currently    Birth control/protection: Surgical  Other Topics Concern   Not on file  Social History Narrative   Not on file   Social Determinants of Health   Financial Resource Strain: Low Risk    Difficulty of Paying Living Expenses: Not hard at all  Food Insecurity: No Food Insecurity   Worried About Charity fundraiser in the Last Year: Never true   [place] in the Last Year: Never true  Transportation Needs: No Transportation Needs   Lack of Transportation (Medical): No   Lack of Transportation (Non-Medical): No  Physical Activity: Inactive   Days of Exercise per Week: 0 days   Minutes of Exercise per Session: 0 min  Stress: No Stress Concern Present   Feeling of Stress : Not at all  Social Connections: Moderately Integrated   Frequency of Communication with Friends and Family: More than three times a week   Frequency of Social Gatherings with Friends and Family: More than three times a week   Attends Religious Services: More than 4 times per year   Active Member of [employer] or Organizations: Yes   Attends Archivist Meetings: More than 4 times per year   Marital Status: Widowed  Intimate Partner Violence: Not At Risk   Fear of Current or Ex-Partner: No   Emotionally Abused: No   Physically Abused: No   Sexually Abused: No    Outpatient Encounter Medications as of 10/07/2020  Medication Sig   albuterol (VENTOLIN HFA) 108 (90 Base) MCG/ACT inhaler Inhale 2 puffs into the lungs every 6 (six) hours as needed for wheezing or shortness of breath.   amLODipine (NORVASC) 5 MG tablet Take 5 mg by mouth daily.   Ascorbic Acid (VITAMIN C) 1000 MG tablet Take 1,000 mg by mouth daily.    aspirin 81 MG tablet Take 81 mg by mouth daily.   baclofen (LIORESAL) 10 MG tablet Take 1 tablet (10 mg total) by mouth at bedtime as needed for muscle spasms.   Cholecalciferol (VITAMIN D-3) 1000 UNITS CAPS Take 2,000 Units by mouth daily.   clobetasol cream (TEMOVATE)  0.05 % Apply topically 2 (two) times daily.   diphenhydrAMINE (BENADRYL) 25 MG tablet Take 25 mg by mouth daily as needed (allergies).    famotidine (PEPCID) 20 MG tablet Take 1 tablet (20 mg total) by mouth 2 (two) times daily.   HYDROcodone-acetaminophen (NORCO) 5-325 MG tablet Take 1-2 tablets by mouth every 6 (six) hours as needed.   ibuprofen (ADVIL,MOTRIN) 200 MG tablet Take 800 mg by mouth 2 (two) times daily.    losartan-hydrochlorothiazide (HYZAAR) 100-25 MG tablet Take 1 tablet by mouth daily.   magnesium hydroxide (MILK OF MAGNESIA) 400 MG/5ML suspension Take by mouth as needed for mild constipation.   mirabegron ER (MYRBETRIQ) 25 MG TB24 tablet Take 1 tablet (25 mg total) by mouth daily.  Multiple Minerals-Vitamins (CALCIUM-MAGNESIUM-ZINC-D3 PO) Take 1 tablet by mouth daily.   Multiple Vitamin (MULTIVITAMIN) tablet Take 1 tablet by mouth daily.   nystatin cream (MYCOSTATIN) Apply 1 application topically daily.   Polyethyl Glycol-Propyl Glycol (SYSTANE OP) Place 1 drop into both eyes daily as needed (dry eyes).   Potassium 99 MG TABS Take 1 tablet by mouth daily.   rOPINIRole (REQUIP) 2 MG tablet Take 1 tablet (2 mg total) by mouth at bedtime. For leg cramps   triamcinolone cream (KENALOG) 0.1 % Apply 1 application topically 2 (two) times daily.   zinc gluconate 50 MG tablet Take 50 mg by mouth daily.   No facility-administered encounter medications on file as of 10/07/2020.    Allergies  Allergen Reactions   Macrolides And Ketolides     Bleeding thru bowels   Codeine Nausea And Vomiting   Sulfa Antibiotics Nausea And Vomiting    Review of Systems  Constitutional:  Negative for activity change, appetite change, chills, diaphoresis, fatigue, fever and unexpected weight change.  HENT:  Positive for congestion, sore throat and trouble swallowing. Negative for dental problem, drooling, ear discharge, ear pain, facial swelling, hearing loss, hoarse voice, mouth sores, nosebleeds,  postnasal drip, rhinorrhea, sinus pressure, sinus pain, sneezing, tinnitus and voice change.   Eyes: Negative.   Respiratory:  Positive for cough. Negative for chest tightness, shortness of breath and stridor.   Cardiovascular:  Negative for chest pain, palpitations and leg swelling.  Gastrointestinal:  Negative for abdominal pain, blood in stool, constipation, diarrhea, nausea and vomiting.  Endocrine: Negative.   Genitourinary:  Negative for decreased urine volume, difficulty urinating, dysuria, frequency and urgency.  Musculoskeletal:  Negative for arthralgias, myalgias and neck pain.  Skin: Negative.   Allergic/Immunologic: Negative.   Neurological:  Positive for headaches. Negative for dizziness, tremors, seizures, syncope, facial asymmetry, speech difficulty, weakness, light-headedness and numbness.  Hematological: Negative.   Psychiatric/Behavioral:  Negative for confusion, hallucinations, sleep disturbance and suicidal ideas.   All other systems reviewed and are negative.       Observations/Objective: No vital signs or physical exam, this was a telephone or virtual health encounter.  Pt alert and oriented, answers all questions appropriately, and able to speak in full sentences.    Assessment and Plan: Anna Bradford was seen today for sore throat.  Diagnoses and all orders for this visit:  Sore throat Labs pending, treatment pending results. Otherwise, symptomatic care discussed in detail.  -     Rapid Strep Screen ([facility] ONLY) -     Novel Coronavirus, NAA ([facility])  Cough Labs pending, treatment pending results. Otherwise, symptomatic care discussed in detail.  -     Novel Coronavirus, NAA ([facility])     Follow Up Instructions: Return if symptoms worsen or fail to improve.    I discussed the assessment and treatment plan with the patient. The patient was provided an opportunity to ask questions and all were answered. The patient agreed with the plan and  demonstrated an understanding of the instructions.   The patient was advised to call back or seek an in-person evaluation if the symptoms worsen or if the condition fails to improve as anticipated.  The above assessment and management plan was discussed with the patient. The patient verbalized understanding of and has agreed to the management plan. Patient is aware to call the clinic if they develop any new symptoms or if symptoms persist or worsen. Patient is aware when to return to the clinic for a follow-up  visit. Patient educated on when it is appropriate to go to the emergency department.    I provided 12 minutes of non-face-to-face time during this encounter. The call started at 1039. The call ended at 1050. The other time was used for coordination of care.    Monia Pouch, FNP-C [facility] [address] [place], [place] [address] [phone] 10/07/2020

## 2020-10-08 DIAGNOSIS — U071 COVID-19: Secondary | ICD-10-CM | POA: Diagnosis not present

## 2020-10-08 DIAGNOSIS — J029 Acute pharyngitis, unspecified: Secondary | ICD-10-CM | POA: Diagnosis not present

## 2020-10-08 LAB — SARS-COV-2, NAA 2 DAY TAT

## 2020-10-08 LAB — NOVEL CORONAVIRUS, NAA: SARS-CoV-2, NAA: DETECTED — AB

## 2020-10-16 ENCOUNTER — Ambulatory Visit (INDEPENDENT_AMBULATORY_CARE_PROVIDER_SITE_OTHER): Payer: Medicare Other | Admitting: Family Medicine

## 2020-10-16 ENCOUNTER — Ambulatory Visit (INDEPENDENT_AMBULATORY_CARE_PROVIDER_SITE_OTHER): Payer: Medicare Other | Admitting: General Surgery

## 2020-10-16 ENCOUNTER — Encounter: Payer: Self-pay | Admitting: Family Medicine

## 2020-10-16 DIAGNOSIS — J40 Bronchitis, not specified as acute or chronic: Secondary | ICD-10-CM | POA: Diagnosis not present

## 2020-10-16 DIAGNOSIS — D171 Benign lipomatous neoplasm of skin and subcutaneous tissue of trunk: Secondary | ICD-10-CM

## 2020-10-16 MED ORDER — PREDNISONE 20 MG PO TABS: ORAL_TABLET | ORAL | 0 refills | Status: DC

## 2020-10-16 MED ORDER — AMOXICILLIN-POT CLAVULANATE 875-125 MG PO TABS: 1.0000 | ORAL_TABLET | Freq: Two times a day (BID) | ORAL | 0 refills | Status: DC

## 2020-10-16 NOTE — Progress Notes (Signed)
[facility]  I am calling the patient for post operative evaluation. This is not a billable encounter as it is under the [place] charges for the surgery.  The patient had a lipoma excision on 10/02/2020. Ms. Gerald did answer voicemails left.  Pathology: With mature fatty tissue consistent with lipoma   Will see the patient PRN.   Curlene Labrum, MD [facility] [address] [place], [place] [address] [phone] (office)

## 2020-10-16 NOTE — Progress Notes (Signed)
Virtual Visit via telephone Note  I connected with Anna Bradford on 10/16/20 at 1037 by telephone and verified that I am speaking with the correct person using two identifiers. Anna Bradford is currently located at home and patient are currently with her during visit. The provider, Fransisca Kaufmann Denee Boeder, MD is located in their office at time of visit.  Call ended at 1043  I discussed the limitations, risks, security and privacy concerns of performing an evaluation and management service by telephone and the availability of in person appointments. I also discussed with the patient that there may be a patient responsible charge related to this service. The patient expressed understanding and agreed to proceed.   History and Present Illness: Patient is calling in for covid f/u and she was tested last week and took antiviral and finished 4 days ago.  She is getting mucous up and she is feeling worse.  Her sore throat is better.  She is having more coughing and wheezing and denies shortness of breath. She is using mucinex and chloraseptin and flonase and they are helping.  She felt achy this morning. She feels congested and she is feeling worse.   1. Bronchitis     Outpatient Encounter Medications as of 10/16/2020  Medication Sig   amoxicillin-clavulanate (AUGMENTIN) 875-125 MG tablet Take 1 tablet by mouth 2 (two) times daily.   predniSONE (DELTASONE) 20 MG tablet 2 po at same time daily for 5 days   albuterol (VENTOLIN HFA) 108 (90 Base) MCG/ACT inhaler Inhale 2 puffs into the lungs every 6 (six) hours as needed for wheezing or shortness of breath.   amLODipine (NORVASC) 5 MG tablet Take 5 mg by mouth daily.   Ascorbic Acid (VITAMIN C) 1000 MG tablet Take 1,000 mg by mouth daily.    aspirin 81 MG tablet Take 81 mg by mouth daily.   baclofen (LIORESAL) 10 MG tablet Take 1 tablet (10 mg total) by mouth at bedtime as needed for muscle spasms.   Cholecalciferol (VITAMIN D-3) 1000 UNITS CAPS Take 2,000  Units by mouth daily.   clobetasol cream (TEMOVATE) 0.05 % Apply topically 2 (two) times daily.   diphenhydrAMINE (BENADRYL) 25 MG tablet Take 25 mg by mouth daily as needed (allergies).    famotidine (PEPCID) 20 MG tablet Take 1 tablet (20 mg total) by mouth 2 (two) times daily.   HYDROcodone-acetaminophen (NORCO) 5-325 MG tablet Take 1-2 tablets by mouth every 6 (six) hours as needed.   ibuprofen (ADVIL,MOTRIN) 200 MG tablet Take 800 mg by mouth 2 (two) times daily.    losartan-hydrochlorothiazide (HYZAAR) 100-25 MG tablet Take 1 tablet by mouth daily.   magnesium hydroxide (MILK OF MAGNESIA) 400 MG/5ML suspension Take by mouth as needed for mild constipation.   mirabegron ER (MYRBETRIQ) 25 MG TB24 tablet Take 1 tablet (25 mg total) by mouth daily.   Multiple Minerals-Vitamins (CALCIUM-MAGNESIUM-ZINC-D3 PO) Take 1 tablet by mouth daily.   Multiple Vitamin (MULTIVITAMIN) tablet Take 1 tablet by mouth daily.   nystatin cream (MYCOSTATIN) Apply 1 application topically daily.   Polyethyl Glycol-Propyl Glycol (SYSTANE OP) Place 1 drop into both eyes daily as needed (dry eyes).   Potassium 99 MG TABS Take 1 tablet by mouth daily.   rOPINIRole (REQUIP) 2 MG tablet Take 1 tablet (2 mg total) by mouth at bedtime. For leg cramps   triamcinolone cream (KENALOG) 0.1 % Apply 1 application topically 2 (two) times daily.   zinc gluconate 50 MG tablet Take 50 mg  by mouth daily.   No facility-administered encounter medications on file as of 10/16/2020.    Review of Systems  Constitutional:  Negative for chills and fever.  HENT:  Positive for congestion, postnasal drip and rhinorrhea. Negative for ear discharge, ear pain, sinus pressure, sneezing and sore throat.   Eyes:  Negative for visual disturbance.  Respiratory:  Positive for cough and wheezing. Negative for chest tightness and shortness of breath.   Cardiovascular:  Negative for chest pain and leg swelling.  Skin:  Negative for rash.  Neurological:   Negative for light-headedness and headaches.  Psychiatric/Behavioral:  Negative for agitation and behavioral problems.   All other systems reviewed and are negative.  Observations/Objective: Patient sounds comfortable and in no acute distress  Assessment and Plan: Problem List Items Addressed This Visit   None Visit Diagnoses     Bronchitis    -  Primary   Relevant Medications   amoxicillin-clavulanate (AUGMENTIN) 875-125 MG tablet   predniSONE (DELTASONE) 20 MG tablet       Will treat bronchitis and call back if needed Follow up plan: Return if symptoms worsen or fail to improve.     I discussed the assessment and treatment plan with the patient. The patient was provided an opportunity to ask questions and all were answered. The patient agreed with the plan and demonstrated an understanding of the instructions.   The patient was advised to call back or seek an in-person evaluation if the symptoms worsen or if the condition fails to improve as anticipated.  The above assessment and management plan was discussed with the patient. The patient verbalized understanding of and has agreed to the management plan. Patient is aware to call the clinic if symptoms persist or worsen. Patient is aware when to return to the clinic for a follow-up visit. Patient educated on when it is appropriate to go to the emergency department.    I provided 6 minutes of non-face-to-face time during this encounter.    Worthy Rancher, MD

## 2020-10-30 ENCOUNTER — Other Ambulatory Visit: Payer: Self-pay

## 2020-10-30 ENCOUNTER — Ambulatory Visit (INDEPENDENT_AMBULATORY_CARE_PROVIDER_SITE_OTHER): Payer: Medicare Other | Admitting: General Surgery

## 2020-10-30 ENCOUNTER — Encounter: Payer: Self-pay | Admitting: General Surgery

## 2020-10-30 VITALS — HR 78 | Temp 97.9°F | Resp 16 | Ht 65.0 in | Wt 226.0 lb

## 2020-10-30 DIAGNOSIS — R590 Localized enlarged lymph nodes: Secondary | ICD-10-CM

## 2020-10-30 DIAGNOSIS — D171 Benign lipomatous neoplasm of skin and subcutaneous tissue of trunk: Secondary | ICD-10-CM

## 2020-10-30 NOTE — Patient Instructions (Addendum)
Areas on prior incision are inflammation; probably old blood in the area. It will improve with time. I think the upper pain is on the rib.   Will get you scheduled for a lipoma excision in the office.   Chest Wall Pain Chest wall pain is pain in or around the bones and muscles of your chest. Sometimes, an injury causes this pain. Excessive coughing or overuse of arm and chest muscles may also cause chest wall pain. Sometimes, the cause may not be known. This pain may take several weeks or longer to get better. Follow these instructions at home: Managing pain, stiffness, and swelling  If directed, put ice on the painful area: Put ice in a plastic bag. Place a towel between your skin and the bag. Leave the ice on for 20 minutes, 2-3 times per day. Activity Rest as told by your health care provider. Avoid activities that cause pain. These include any activities that use your chest muscles or your abdominal and side muscles to lift heavy items. Ask your health care provider what activities are safe for you. General instructions  Take over-the-counter and prescription medicines only as told by your health care provider. Do not use any products that contain nicotine or tobacco, such as cigarettes, e-cigarettes, and chewing tobacco. These can delay healing after injury. If you need help quitting, ask your health care provider. Keep all follow-up visits as told by your health care provider. This is important. Contact a health care provider if: You have a fever. Your chest pain becomes worse. You have new symptoms. Get help right away if: You have nausea or vomiting. You feel sweaty or light-headed. You have a cough with mucus from your lungs (sputum) or you cough up blood. You develop shortness of breath. These symptoms may represent a serious problem that is an emergency. Do not wait to see if the symptoms will go away. Get medical help right away. Call your local emergency services (911 in  the [place] Do not drive yourself to the hospital. Summary Chest wall pain is pain in or around the bones and muscles of your chest. Depending on the cause, it may be treated with ice, rest, medicines, and avoiding activities that cause pain. Contact a health care provider if you have a fever, worsening chest pain, or new symptoms. Get help right away if you feel light-headed or you develop shortness of breath. These symptoms may be an emergency. This information is not intended to replace advice given to you by your health care provider. Make sure you discuss any questions you have with your health care provider. Document Revised: 03/21/2020 Document Reviewed: 03/21/2020 Elsevier Patient Education  [place].  Lymphadenopathy Lymphadenopathy means that your lymph glands are swollen or larger than normal. Lymph glands, also called lymph nodes, are collections of tissue that filter excess fluid, bacteria, viruses, and waste from your bloodstream. They are part of your body's disease-fighting system (immune system), which protects your body from germs. There may be different causes of lymphadenopathy, depending on where it is in your body. Some types go away on their own. Lymphadenopathy can occur anywhere that you have lymph glands, including these areas: Neck (cervical lymphadenopathy). Chest (mediastinal lymphadenopathy). Lungs (hilar lymphadenopathy). Underarms (axillary lymphadenopathy). Groin (inguinal lymphadenopathy). When your immune system responds to germs, infection-fighting cells and fluid build up in your lymph glands. This causes some swelling and enlargement. If the lymph nodes do not go back to normal size after you have an infection or  disease, your health care provider may do tests. These tests help to monitor your condition and find the reason why the glands are still swollen and enlarged. Follow these instructions at home:  Get plenty of rest. Your health care  provider may recommend over-the-counter medicines for pain. Take over-the-counter and prescription medicines only as told by your health care provider. If directed, apply heat to swollen lymph glands as often as told by your health care provider. Use the heat source that your health care provider recommends, such as a moist heat pack or a heating pad. Place a towel between your skin and the heat source. Leave the heat on for 20-30 minutes. Remove the heat if your skin turns bright red. This is especially important if you are unable to feel pain, heat, or cold. You may have a greater risk of getting burned. Check your affected lymph glands every day for changes. Check other lymph gland areas as told by your health care provider. Check for changes such as: More swelling. Sudden increase in size. Redness or pain. Hardness. Keep all follow-up visits. This is important. Contact a health care provider if you have: Lymph glands that: Are still swollen after 2 weeks. Have suddenly gotten bigger or the swelling spreads. Are red, painful, or hard. Fluid leaking from the skin near an enlarged lymph gland. Problems with breathing. A fever, chills, or night sweats. Fatigue. A sore throat. Pain in your abdomen. Weight loss. Get help right away if you have: Severe pain. Chest pain. Shortness of breath. These symptoms may represent a serious problem that is an emergency. Do not wait to see if the symptoms will go away. Get medical help right away. Call your local emergency services (911 in the [place] Do not drive yourself to the hospital. Summary Lymphadenopathy means that your lymph glands are swollen or larger than normal. Lymph glands, also called lymph nodes, are collections of tissue that filter excess fluid, bacteria, viruses, and waste from the bloodstream. They are part of your body's disease-fighting system (immune system). Lymphadenopathy can occur anywhere that you have lymph glands. If  the lymph nodes do not go back to normal size after you have an infection or disease, your health care provider may do tests to monitor your condition and find the reason why the glands are still swollen and enlarged. Check your affected lymph glands every day for changes. Check other lymph gland areas as told by your health care provider. This information is not intended to replace advice given to you by your health care provider. Make sure you discuss any questions you have with your health care provider. Document Revised: 10/31/2019 Document Reviewed: 10/31/2019 Elsevier Patient Education  2022 [employer].

## 2020-10-30 NOTE — Progress Notes (Signed)
[facility] Note   HPI:  87 y.o. Female presents to clinic follow-up evaluation of the lipoma excision site. Patient reports feeling no pain post op but that she has noticed a hardness now and is worried it has recurred. She also has one on her abdomen she wants removed. She also has pain on the ribs superior.   Review of Systems:  Tender on right posterior rib, no lipoma All other review of systems: otherwise negative   Vital Signs:  Pulse 78   Temp 97.9 F (36.6 C) (Other (Comment))   Resp 16   Ht 5\' 5"  (1.651 m)   Wt 226 lb (102.5 kg)   SpO2 95%   BMI 37.61 kg/m    Physical Exam:  Physical Exam Cardiovascular:     Rate and Rhythm: Normal rate.  Pulmonary:     Effort: Pulmonary effort is normal.  Abdominal:     General: There is no distension.     Palpations: Abdomen is soft.     Tenderness: There is abdominal tenderness.     Comments: Right upper quadrant with 1cm hard area in her scar from her cholecystectomy, tender  Musculoskeletal:     Comments: Lipoma excision site c/d/I with healing incision, induration, no lipoma recurrence, ribs on the superior posterior rib on right tender  Neurological:     Mental Status: She is alert.     Assessment:  87 y.o. yo Female with a indurated area on her prior lipoma excision, no signs of recurrence. Rib pain but no masses, and lipoma in the right upper quadrant incision.  Plan:  Will plan to excision the abdominal lipoma with local in the clinic Induration of the prior incision Rib pain, recommend heating pad  Future Appointments  Date Time Provider [place]  11/10/2020  1:15 PM [facility]-ANNUAL WELLNESS VISIT [facility] None  11/27/2020  3:15 PM Virl Cagey, MD [facility] None     Curlene Labrum, MD [facility] [address] [place], [place] [address] [phone] (office)

## 2020-11-03 ENCOUNTER — Other Ambulatory Visit: Payer: Self-pay | Admitting: Family Medicine

## 2020-11-05 ENCOUNTER — Encounter: Payer: Self-pay | Admitting: Family Medicine

## 2020-11-05 ENCOUNTER — Ambulatory Visit (INDEPENDENT_AMBULATORY_CARE_PROVIDER_SITE_OTHER): Payer: Medicare Other | Admitting: Family Medicine

## 2020-11-05 DIAGNOSIS — J40 Bronchitis, not specified as acute or chronic: Secondary | ICD-10-CM | POA: Diagnosis not present

## 2020-11-05 MED ORDER — DOXYCYCLINE HYCLATE 100 MG PO TABS: 100.0000 mg | ORAL_TABLET | Freq: Two times a day (BID) | ORAL | 0 refills | Status: DC

## 2020-11-05 MED ORDER — DEXAMETHASONE 2 MG PO TABS
ORAL_TABLET | ORAL | 0 refills | Status: DC
Start: 2020-11-05 — End: 2021-01-30

## 2020-11-05 NOTE — Progress Notes (Signed)
Virtual Visit via telephone Note  I connected with Anna Bradford on 11/05/20 at 0818 by telephone and verified that I am speaking with the correct person using two identifiers. Anna Bradford is currently located at home and patient are currently with her during visit. The provider, Fransisca Kaufmann Zimir Kittleson, MD is located in their office at time of visit.  Call ended at 0829  I discussed the limitations, risks, security and privacy concerns of performing an evaluation and management service by telephone and the availability of in person appointments. I also discussed with the patient that there may be a patient responsible charge related to this service. The patient expressed understanding and agreed to proceed.   History and Present Illness: Patient is calling in for congestion.  She had covid the 20th of last month, 30 days ago.  She is coughing up white thick phlegm.  She took nyquil and it helped with cough but she feels like it is getting down in chest now. She took prednisone and augmentin and feels like it didn't get better while on them.  She feels like it has gotten down into chest now. She denies fevers or chills. She denies shortness of breath except on exertion or wheezing.   1. Bronchitis     Outpatient Encounter Medications as of 11/05/2020  Medication Sig   dexamethasone (DECADRON) 2 MG tablet Take 4 tablets for 3 days then 3 tablets for 3 days then 2 tablets for 3 days then 1 tablet for 3 days   doxycycline (VIBRA-TABS) 100 MG tablet Take 1 tablet (100 mg total) by mouth 2 (two) times daily. 1 po bid   albuterol (VENTOLIN HFA) 108 (90 Base) MCG/ACT inhaler Inhale 2 puffs into the lungs every 6 (six) hours as needed for wheezing or shortness of breath.   amLODipine (NORVASC) 10 MG tablet TAKE 1 TABLET BY MOUTH  DAILY   amLODipine (NORVASC) 5 MG tablet Take 5 mg by mouth daily.   Ascorbic Acid (VITAMIN C) 1000 MG tablet Take 1,000 mg by mouth daily.    aspirin 81 MG tablet Take 81 mg  by mouth daily.   baclofen (LIORESAL) 10 MG tablet Take 1 tablet (10 mg total) by mouth at bedtime as needed for muscle spasms.   Cholecalciferol (VITAMIN D-3) 1000 UNITS CAPS Take 2,000 Units by mouth daily.   clobetasol cream (TEMOVATE) 0.05 % Apply topically 2 (two) times daily.   diphenhydrAMINE (BENADRYL) 25 MG tablet Take 25 mg by mouth daily as needed (allergies).    famotidine (PEPCID) 20 MG tablet Take 1 tablet (20 mg total) by mouth 2 (two) times daily.   HYDROcodone-acetaminophen (NORCO) 5-325 MG tablet Take 1-2 tablets by mouth every 6 (six) hours as needed.   ibuprofen (ADVIL,MOTRIN) 200 MG tablet Take 800 mg by mouth 2 (two) times daily.    losartan-hydrochlorothiazide (HYZAAR) 100-25 MG tablet Take 1 tablet by mouth daily.   magnesium hydroxide (MILK OF MAGNESIA) 400 MG/5ML suspension Take by mouth as needed for mild constipation.   mirabegron ER (MYRBETRIQ) 25 MG TB24 tablet Take 1 tablet (25 mg total) by mouth daily.   Multiple Minerals-Vitamins (CALCIUM-MAGNESIUM-ZINC-D3 PO) Take 1 tablet by mouth daily.   Multiple Vitamin (MULTIVITAMIN) tablet Take 1 tablet by mouth daily.   nystatin cream (MYCOSTATIN) Apply 1 application topically daily.   Polyethyl Glycol-Propyl Glycol (SYSTANE OP) Place 1 drop into both eyes daily as needed (dry eyes).   Potassium 99 MG TABS Take 1 tablet by mouth daily.  rOPINIRole (REQUIP) 2 MG tablet Take 1 tablet (2 mg total) by mouth at bedtime. For leg cramps   triamcinolone cream (KENALOG) 0.1 % Apply 1 application topically 2 (two) times daily.   zinc gluconate 50 MG tablet Take 50 mg by mouth daily.   [DISCONTINUED] amoxicillin-clavulanate (AUGMENTIN) 875-125 MG tablet Take 1 tablet by mouth 2 (two) times daily.   [DISCONTINUED] predniSONE (DELTASONE) 20 MG tablet 2 po at same time daily for 5 days   No facility-administered encounter medications on file as of 11/05/2020.    Review of Systems  Constitutional:  Negative for chills and fever.   HENT:  Positive for congestion, postnasal drip, rhinorrhea, sinus pressure, sneezing and voice change. Negative for ear discharge, ear pain and sore throat.   Eyes:  Negative for pain, redness and visual disturbance.  Respiratory:  Positive for cough. Negative for chest tightness, shortness of breath and wheezing.   Cardiovascular:  Negative for chest pain and leg swelling.  Genitourinary:  Negative for difficulty urinating and dysuria.  Skin:  Negative for rash.  Neurological:  Negative for light-headedness and headaches.  Psychiatric/Behavioral:  Negative for agitation and behavioral problems.   All other systems reviewed and are negative.  Observations/Objective: Patient sounds comfortable and in no acute distress  Assessment and Plan: Problem List Items Addressed This Visit   None Visit Diagnoses     Bronchitis    -  Primary   Relevant Medications   dexamethasone (DECADRON) 2 MG tablet   doxycycline (VIBRA-TABS) 100 MG tablet       Will try decadron taper doxycycline Follow up plan: Return if symptoms worsen or fail to improve.  Patient seems to not be resolving, likely post COVID syndrome versus superinfection, will treat with doxycycline and dexamethasone to see if we can reduce inflammation and recommended Mucinex and to start using her albuterol inhalers which she has not been using.    I discussed the assessment and treatment plan with the patient. The patient was provided an opportunity to ask questions and all were answered. The patient agreed with the plan and demonstrated an understanding of the instructions.   The patient was advised to call back or seek an in-person evaluation if the symptoms worsen or if the condition fails to improve as anticipated.  The above assessment and management plan was discussed with the patient. The patient verbalized understanding of and has agreed to the management plan. Patient is aware to call the clinic if symptoms persist or  worsen. Patient is aware when to return to the clinic for a follow-up visit. Patient educated on when it is appropriate to go to the emergency department.    I provided 11 minutes of non-face-to-face time during this encounter.    Worthy Rancher, MD

## 2020-11-10 ENCOUNTER — Ambulatory Visit (INDEPENDENT_AMBULATORY_CARE_PROVIDER_SITE_OTHER): Payer: Medicare Other

## 2020-11-10 VITALS — Ht 65.0 in | Wt 226.0 lb

## 2020-11-10 DIAGNOSIS — Z0000 Encounter for general adult medical examination without abnormal findings: Secondary | ICD-10-CM

## 2020-11-10 NOTE — Patient Instructions (Signed)
[name] , Thank you for taking time to come for your Medicare Wellness Visit. I appreciate your ongoing commitment to your health goals. Please review the following plan we discussed and let me know if I can assist you in the future.   Screening recommendations/referrals: Colonoscopy: Done 02/06/2015 - no repeat Mammogram: No longer required Bone Density: Done 12/24/2019 - Repeat every 2 years  Recommended yearly ophthalmology/optometry visit for glaucoma screening and checkup Recommended yearly dental visit for hygiene and checkup  Vaccinations: Influenza vaccine: Done 10/2020 - Repeat annually  Pneumococcal vaccine: Done 12/16/2014 & 11/14/2004 Tdap vaccine: Done 07/03/2012 - Repeat in 10 years Shingles vaccine: Due   Covid-19:Done 02/27/2019 & 04/03/2019 - declines boosters  Advanced directives: Please bring a copy of your health care power of attorney and living will to the office to be added to your chart at your convenience.   Conditions/risks identified: Aim for 30 minutes of exercise or brisk walking each day, drink 6-8 glasses of water and eat lots of fruits and vegetables.   Next appointment: Follow up in one year for your annual wellness visit    Preventive Care 65 Years and Older, Female Preventive care refers to lifestyle choices and visits with your health care provider that can promote health and wellness. What does preventive care include? A yearly physical exam. This is also called an annual well check. Dental exams once or twice a year. Routine eye exams. Ask your health care provider how often you should have your eyes checked. Personal lifestyle choices, including: Daily care of your teeth and gums. Regular physical activity. Eating a healthy diet. Avoiding tobacco and drug use. Limiting alcohol use. Practicing safe sex. Taking low-dose aspirin every day. Taking vitamin and mineral supplements as recommended by your health care provider. What happens during an  annual well check? The services and screenings done by your health care provider during your annual well check will depend on your age, overall health, lifestyle risk factors, and family history of disease. Counseling  Your health care provider may ask you questions about your: Alcohol use. Tobacco use. Drug use. Emotional well-being. Home and relationship well-being. Sexual activity. Eating habits. History of falls. Memory and ability to understand (cognition). Work and work Statistician. Reproductive health. Screening  You may have the following tests or measurements: Height, weight, and BMI. Blood pressure. Lipid and cholesterol levels. These may be checked every 5 years, or more frequently if you are over 37 years old. Skin check. Lung cancer screening. You may have this screening every year starting at age 20 if you have a 30-pack-year history of smoking and currently smoke or have quit within the past 15 years. Fecal occult blood test (FOBT) of the stool. You may have this test every year starting at age 29. Flexible sigmoidoscopy or colonoscopy. You may have a sigmoidoscopy every 5 years or a colonoscopy every 10 years starting at age 36. Hepatitis C blood test. Hepatitis B blood test. Sexually transmitted disease (STD) testing. Diabetes screening. This is done by checking your blood sugar (glucose) after you have not eaten for a while (fasting). You may have this done every 1-3 years. Bone density scan. This is done to screen for osteoporosis. You may have this done starting at age 67. Mammogram. This may be done every 1-2 years. Talk to your health care provider about how often you should have regular mammograms. Talk with your health care provider about your test results, treatment options, and if necessary, the need for  more tests. Vaccines  Your health care provider may recommend certain vaccines, such as: Influenza vaccine. This is recommended every year. Tetanus,  diphtheria, and acellular pertussis (Tdap, Td) vaccine. You may need a Td booster every 10 years. Zoster vaccine. You may need this after age 69. Pneumococcal 13-valent conjugate (PCV13) vaccine. One dose is recommended after age 75. Pneumococcal polysaccharide (PPSV23) vaccine. One dose is recommended after age 53. Talk to your health care provider about which screenings and vaccines you need and how often you need them. This information is not intended to replace advice given to you by your health care provider. Make sure you discuss any questions you have with your health care provider. Document Released: 01/31/2015 Document Revised: 09/24/2015 Document Reviewed: 11/05/2014 Elsevier Interactive Patient Education  2017 [place] Prevention in the Home Falls can cause injuries. They can happen to people of all ages. There are many things you can do to make your home safe and to help prevent falls. What can I do on the outside of my home? Regularly fix the edges of walkways and driveways and fix any cracks. Remove anything that might make you trip as you walk through a door, such as a raised step or threshold. Trim any bushes or trees on the path to your home. Use bright outdoor lighting. Clear any walking paths of anything that might make someone trip, such as rocks or tools. Regularly check to see if handrails are loose or broken. Make sure that both sides of any steps have handrails. Any raised decks and porches should have guardrails on the edges. Have any leaves, snow, or ice cleared regularly. Use sand or salt on walking paths during winter. Clean up any spills in your garage right away. This includes oil or grease spills. What can I do in the bathroom? Use night lights. Install grab bars by the toilet and in the tub and shower. Do not use towel bars as grab bars. Use non-skid mats or decals in the tub or shower. If you need to sit down in the shower, use a plastic,  non-slip stool. Keep the floor dry. Clean up any water that spills on the floor as soon as it happens. Remove soap buildup in the tub or shower regularly. Attach bath mats securely with double-sided non-slip rug tape. Do not have throw rugs and other things on the floor that can make you trip. What can I do in the bedroom? Use night lights. Make sure that you have a light by your bed that is easy to reach. Do not use any sheets or blankets that are too big for your bed. They should not hang down onto the floor. Have a firm chair that has side arms. You can use this for support while you get dressed. Do not have throw rugs and other things on the floor that can make you trip. What can I do in the kitchen? Clean up any spills right away. Avoid walking on wet floors. Keep items that you use a lot in easy-to-reach places. If you need to reach something above you, use a strong step stool that has a grab bar. Keep electrical cords out of the way. Do not use floor polish or wax that makes floors slippery. If you must use wax, use non-skid floor wax. Do not have throw rugs and other things on the floor that can make you trip. What can I do with my stairs? Do not leave any items on the stairs. Make  sure that there are handrails on both sides of the stairs and use them. Fix handrails that are broken or loose. Make sure that handrails are as long as the stairways. Check any carpeting to make sure that it is firmly attached to the stairs. Fix any carpet that is loose or worn. Avoid having throw rugs at the top or bottom of the stairs. If you do have throw rugs, attach them to the floor with carpet tape. Make sure that you have a light switch at the top of the stairs and the bottom of the stairs. If you do not have them, ask someone to add them for you. What else can I do to help prevent falls? Wear shoes that: Do not have high heels. Have rubber bottoms. Are comfortable and fit you well. Are closed  at the toe. Do not wear sandals. If you use a stepladder: Make sure that it is fully opened. Do not climb a closed stepladder. Make sure that both sides of the stepladder are locked into place. Ask someone to hold it for you, if possible. Clearly mark and make sure that you can see: Any grab bars or handrails. First and last steps. Where the edge of each step is. Use tools that help you move around (mobility aids) if they are needed. These include: Canes. Walkers. Scooters. Crutches. Turn on the lights when you go into a dark area. Replace any light bulbs as soon as they burn out. Set up your furniture so you have a clear path. Avoid moving your furniture around. If any of your floors are uneven, fix them. If there are any pets around you, be aware of where they are. Review your medicines with your doctor. Some medicines can make you feel dizzy. This can increase your chance of falling. Ask your doctor what other things that you can do to help prevent falls. This information is not intended to replace advice given to you by your health care provider. Make sure you discuss any questions you have with your health care provider. Document Released: 10/31/2008 Document Revised: 06/12/2015 Document Reviewed: 02/08/2014 Elsevier Interactive Patient Education  2017 [employer].

## 2020-11-10 NOTE — Progress Notes (Signed)
Subjective:   Anna Bradford is a 87 y.o. female who presents for Medicare Annual (Subsequent) preventive examination.  Virtual Visit via Telephone Note  I connected with  Anna Bradford on 11/10/20 at  1:15 PM EDT by telephone and verified that I am speaking with the correct person using two identifiers.  Location: Patient: Home Provider: [facility] Persons participating in the virtual visit: patient/Nurse Health Advisor   I discussed the limitations, risks, security and privacy concerns of performing an evaluation and management service by telephone and the availability of in person appointments. The patient expressed understanding and agreed to proceed.  Interactive audio and video telecommunications were attempted between this nurse and patient, however failed, due to patient having technical difficulties OR patient did not have access to video capability.  We continued and completed visit with audio only.  Some vital signs may be absent or patient reported.   Carolin Quang E Inessa Wardrop, LPN   Review of Systems     Cardiac Risk Factors include: advanced age (>60men, >38 women);sedentary lifestyle;obesity (BMI >30kg/m2);hypertension     Objective:    Today's Vitals   11/10/20 1318 11/10/20 1322  Weight: 226 lb (102.5 kg)   Height: 5\' 5"  (1.651 m)   PainSc:  4    Body mass index is 37.61 kg/m.  Advanced Directives 11/10/2020 04/24/2020 04/20/2020 11/09/2019 03/14/2019 11/07/2018 01/30/2018  Does Patient Have a Medical Advance Directive? Yes No No No No No No  Type of Paramedic of [place];Living will - - - - - -  Copy of [place] in Chart? No - copy requested - - - - - -  Would patient like information on creating a medical advance directive? - No - Patient declined - No - Patient declined No - Patient declined No - Patient declined No - Patient declined    Current Medications (verified) Outpatient Encounter Medications as of 11/10/2020  Medication  Sig   albuterol (VENTOLIN HFA) 108 (90 Base) MCG/ACT inhaler Inhale 2 puffs into the lungs every 6 (six) hours as needed for wheezing or shortness of breath.   amLODipine (NORVASC) 10 MG tablet TAKE 1 TABLET BY MOUTH  DAILY   Ascorbic Acid (VITAMIN C) 1000 MG tablet Take 1,000 mg by mouth daily.    aspirin 81 MG tablet Take 81 mg by mouth daily.   baclofen (LIORESAL) 10 MG tablet Take 1 tablet (10 mg total) by mouth at bedtime as needed for muscle spasms.   Cholecalciferol (VITAMIN D-3) 1000 UNITS CAPS Take 2,000 Units by mouth daily.   clobetasol cream (TEMOVATE) 0.05 % Apply topically 2 (two) times daily.   dexamethasone (DECADRON) 2 MG tablet Take 4 tablets for 3 days then 3 tablets for 3 days then 2 tablets for 3 days then 1 tablet for 3 days   doxycycline (VIBRA-TABS) 100 MG tablet Take 1 tablet (100 mg total) by mouth 2 (two) times daily. 1 po bid   famotidine (PEPCID) 20 MG tablet Take 1 tablet (20 mg total) by mouth 2 (two) times daily.   ibuprofen (ADVIL,MOTRIN) 200 MG tablet Take 800 mg by mouth 2 (two) times daily.    ipratropium (ATROVENT) 0.06 % nasal spray Place 2 sprays into both nostrils 3 (three) times daily.   losartan-hydrochlorothiazide (HYZAAR) 100-25 MG tablet Take 1 tablet by mouth daily.   magnesium hydroxide (MILK OF MAGNESIA) 400 MG/5ML suspension Take by mouth as needed for mild constipation.   Multiple Minerals-Vitamins (CALCIUM-MAGNESIUM-ZINC-D3 PO) Take 1 tablet  by mouth daily.   Multiple Vitamin (MULTIVITAMIN) tablet Take 1 tablet by mouth daily.   nystatin cream (MYCOSTATIN) Apply 1 application topically daily.   Polyethyl Glycol-Propyl Glycol (SYSTANE OP) Place 1 drop into both eyes daily as needed (dry eyes).   Potassium 99 MG TABS Take 1 tablet by mouth daily.   triamcinolone cream (KENALOG) 0.1 % Apply 1 application topically 2 (two) times daily.   zinc gluconate 50 MG tablet Take 50 mg by mouth daily.   diphenhydrAMINE (BENADRYL) 25 MG tablet Take 25 mg by  mouth daily as needed (allergies).    HYDROcodone-acetaminophen (NORCO) 5-325 MG tablet Take 1-2 tablets by mouth every 6 (six) hours as needed. (Patient not taking: Reported on 11/10/2020)   lidocaine (XYLOCAINE) 2 % solution SMARTSIG:10 Milliliter(s) By Mouth Every 3 Hours PRN (Patient not taking: Reported on 11/10/2020)   mirabegron ER (MYRBETRIQ) 25 MG TB24 tablet Take 1 tablet (25 mg total) by mouth daily. (Patient not taking: Reported on 11/10/2020)   rOPINIRole (REQUIP) 2 MG tablet Take 1 tablet (2 mg total) by mouth at bedtime. For leg cramps (Patient not taking: Reported on 11/10/2020)   [DISCONTINUED] amLODipine (NORVASC) 5 MG tablet Take 5 mg by mouth daily.   No facility-administered encounter medications on file as of 11/10/2020.    Allergies (verified) Levofloxacin, Nitrofurantoin, Macrolides and ketolides, Codeine, and Sulfa antibiotics   History: Past Medical History:  Diagnosis Date   Arthritis    Cancer ([place]) 1996   left breast   Essential hypertension, benign    GERD (gastroesophageal reflux disease)    Past Surgical History:  Procedure Laterality Date   ABDOMINAL HYSTERECTOMY     Absess Off Intestine     cataract surgery Left 08-10-2011   cataract surgery Right 11-01-2011   CHOLECYSTECTOMY     1972   COLONOSCOPY N/A 02/06/2015   Procedure: COLONOSCOPY;  Surgeon: Rogene Houston, MD;  Location: [facility];  Service: Endoscopy;  Laterality: N/A;  1015   GALLBLADDER SURGERY     Rupture Lower Stomach   HERNIA REPAIR     Hysterectomy ---- unknown     1980   Lapoma Tumor     Left Breast Masectomy     1986   MASS EXCISION N/A 01/30/2018   Procedure: EXCISION MASS 3CM ON BACK AND 3CM ON ABDOMEN;  Surgeon: Virl Cagey, MD;  Location: [facility];  Service: General;  Laterality: N/A;   Right Ankle Surgery     Family History  Problem Relation Age of Onset   Cancer Mother    Coronary artery disease Mother    Heart disease Sister    Social History    Socioeconomic History   Marital status: Widowed    Spouse name: Not on file   Number of children: 2   Years of education: 12-failed the 4th grade and had to repeat   Highest education level: 11th grade  Occupational History   Occupation: retired  Tobacco Use   Smoking status: Never   Smokeless tobacco: Never  Vaping Use   Vaping Use: Never used  Substance and Sexual Activity   Alcohol use: No   Drug use: No   Sexual activity: Not Currently    Birth control/protection: Surgical  Other Topics Concern   Not on file  Social History Narrative   Not on file   Social Determinants of Health   Financial Resource Strain: Low Risk    Difficulty of Paying Living Expenses: Not hard at all  Food Insecurity: No Food Insecurity   Worried About Charity fundraiser in the Last Year: Never true   Ran Out of Food in the Last Year: Never true  Transportation Needs: No Transportation Needs   Lack of Transportation (Medical): No   Lack of Transportation (Non-Medical): No  Physical Activity: Insufficiently Active   Days of Exercise per Week: 7 days   Minutes of Exercise per Session: 10 min  Stress: No Stress Concern Present   Feeling of Stress : Not at all  Social Connections: Moderately Integrated   Frequency of Communication with Friends and Family: More than three times a week   Frequency of Social Gatherings with Friends and Family: More than three times a week   Attends Religious Services: More than 4 times per year   Active Member of [employer] or Organizations: Yes   Attends Archivist Meetings: More than 4 times per year   Marital Status: Widowed    Tobacco Counseling Counseling given: Not Answered   Clinical Intake:  Pre-visit preparation completed: Yes  Pain : 0-10 Pain Score: 4  Pain Type: Chronic pain Pain Location: Flank Pain Orientation: Right Pain Descriptors / Indicators: Aching, Discomfort Pain Onset: More than a month ago Pain Frequency:  Intermittent     BMI - recorded: 37.61 Nutritional Status: BMI > 30  Obese Nutritional Risks: None Diabetes: No  How often do you need to have someone help you when you read instructions, pamphlets, or other written materials from your doctor or pharmacy?: 1 - Never  Diabetic? no  Interpreter Needed?: No  Information entered by :: Danyale Ridinger, LPN   Activities of Daily Living In your present state of health, do you have any difficulty performing the following activities: 11/10/2020  Hearing? N  Vision? N  Difficulty concentrating or making decisions? N  Walking or climbing stairs? Y  Comment a little  Dressing or bathing? N  Doing errands, shopping? N  Preparing Food and eating ? N  Using the Toilet? N  In the past six months, have you accidently leaked urine? Y  Comment when she stands from sitting position - wears pads for protection  Do you have problems with loss of bowel control? N  Managing your Medications? N  Managing your Finances? N  Housekeeping or managing your Housekeeping? N  Some recent data might be hidden    Patient Care Team: Dettinger, Fransisca Kaufmann, MD as PCP - General (Family Medicine)  Indicate any recent Medical Services you may have received from other than [facility] providers in the past year (date may be approximate).     Assessment:   This is a routine wellness examination for Heath.  Hearing/Vision screen Hearing Screening - Comments:: Denies hearing difficulties  Vision Screening - Comments:: Has glasses, but only wears prn - up to date with semi-annual eye exams with [facility]  Dietary issues and exercise activities discussed: Current Exercise Habits: Home exercise routine, Type of exercise: walking;Other - see comments (yard work, house work), Time (Minutes): 30, Frequency (Times/Week): 7, Weekly Exercise (Minutes/Week): 210, Intensity: Mild, Exercise limited by: orthopedic condition(s)   Goals Addressed             This Visit's  Progress    DIET - INCREASE WATER INTAKE   On track    Try to drink 6-8 glasses of water daily.     Exercise 150 min/wk Moderate Activity   On track    11/09/2019 AWV Goal: Exercise for General Health  Patient will verbalize understanding of the benefits of increased physical activity: Exercising regularly is important. It will improve your overall fitness, flexibility, and endurance. Regular exercise also will improve your overall health. It can help you control your weight, reduce stress, and improve your bone density. Over the next year, patient will increase physical activity as tolerated with a goal of at least 150 minutes of moderate physical activity per week.  You can tell that you are exercising at a moderate intensity if your heart starts beating faster and you start breathing faster but can still hold a conversation. Moderate-intensity exercise ideas include: Walking 1 mile (1.6 km) in about 15 minutes Biking Hiking Golfing Dancing Water aerobics Patient will verbalize understanding of everyday activities that increase physical activity by providing examples like the following: Yard work, such as: Sales promotion account executive Gardening Washing windows or floors Patient will be able to explain general safety guidelines for exercising:  Before you start a new exercise program, talk with your health care provider. Do not exercise so much that you hurt yourself, feel dizzy, or get very short of breath. Wear comfortable clothes and wear shoes with good support. Drink plenty of water while you exercise to prevent dehydration or heat stroke. Work out until your breathing and your heartbeat get faster.        Depression Screen PHQ 2/9 Scores 11/10/2020 09/25/2020 07/31/2020 06/25/2020 04/24/2020 03/07/2020 12/19/2019  PHQ - 2 Score 0 0 0 0 0 0 0    Fall Risk Fall Risk  11/10/2020 09/25/2020 09/09/2020 07/31/2020  06/25/2020  Falls in the past year? 0 0 0 0 0  Number falls in past yr: 0 - - - -  Injury with Fall? 1 - - - -  Risk for fall due to : Orthopedic patient - - - -  Follow up Falls prevention discussed - Falls evaluation completed - -  Comment - - - - -    FALL RISK PREVENTION PERTAINING TO THE HOME:  Any stairs in or around the home? No  If so, are there any without handrails? No  Home free of loose throw rugs in walkways, pet beds, electrical cords, etc? Yes  Adequate lighting in your home to reduce risk of falls? Yes   ASSISTIVE DEVICES UTILIZED TO PREVENT FALLS:  Life alert? No  Use of a cane, walker or w/c? No  Grab bars in the bathroom? Yes  Shower chair or bench in shower? Yes  Elevated toilet seat or a handicapped toilet? Yes   TIMED UP AND GO:  Was the test performed? No . Telephonic visit  Cognitive Function: Normal cognitive status assessed by direct observation by this Nurse Health Advisor. No abnormalities found.       6CIT Screen 11/09/2019 11/07/2018  What Year? 0 points 0 points  What month? 0 points 0 points  What time? 0 points 0 points  Count back from 20 0 points 0 points  Months in reverse 0 points 0 points  Repeat phrase 0 points 0 points  Total Score 0 0    Immunizations Immunization History  Administered Date(s) Administered   Influenza Inj Mdck Quad With Preservative 12/07/2019   Influenza Split 11/09/2016   Influenza, High Dose Seasonal PF 12/03/2015, 11/28/2017   Influenza, Seasonal, Injecte, Preservative Fre 10/23/2014   Influenza,inj,Quad PF,6+ Mos 10/27/2018   Influenza,trivalent, recombinat, inj, PF 10/22/2016   Influenza-Unspecified 12/08/2019, 10/30/2020   Moderna Sars-Covid-2 Vaccination  02/27/2019, 04/03/2019   Pneumococcal Conjugate-13 12/16/2014   Pneumococcal Polysaccharide-23 11/14/2004   Tdap 07/03/2012    TDAP status: Up to date  Flu Vaccine status: Up to date  Pneumococcal vaccine status: Up to date  Covid-19 vaccine  status: Completed vaccines  Qualifies for Shingles Vaccine? Yes   Zostavax completed Yes   Shingrix Completed?: No.    Education has been provided regarding the importance of this vaccine. Patient has been advised to call insurance company to determine out of pocket expense if they have not yet received this vaccine. Advised may also receive vaccine at local pharmacy or Health Dept. Verbalized acceptance and understanding.  Screening Tests Health Maintenance  Topic Date Due   Zoster Vaccines- Shingrix (1 of 2) 02/23/2021 (Originally 08/14/1983)   COVID-19 Vaccine (3 - Booster for Moderna series) 06/25/2021 (Originally 05/29/2019)   TETANUS/TDAP  07/04/2022   Pneumonia Vaccine 67+ Years old  Completed   INFLUENZA VACCINE  Completed   DEXA SCAN  Completed   HPV VACCINES  Aged Out    Health Maintenance  There are no preventive care reminders to display for this patient.   Colorectal cancer screening: No longer required.   Mammogram status: No longer required due to age.  Bone Density status: Completed 12/24/2019. Results reflect: Bone density results: OSTEOPOROSIS. Repeat every 2 years.  Lung Cancer Screening: (Low Dose CT Chest recommended if Age 50-80 years, 30 pack-year currently smoking OR have quit w/in 15years.) does not qualify.   Additional Screening:  Hepatitis C Screening: does not qualify  Vision Screening: Recommended annual ophthalmology exams for early detection of glaucoma and other disorders of the eye. Is the patient up to date with their annual eye exam?  Yes  Who is the provider or what is the name of the office in which the patient attends annual eye exams? [facility] If pt is not established with a provider, would they like to be referred to a provider to establish care? No .   Dental Screening: Recommended annual dental exams for proper oral hygiene  Community Resource Referral / Chronic Care Management: CRR required this visit?  No   CCM required this visit?   No      Plan:     I have personally reviewed and noted the following in the patient's chart:   Medical and social history Use of alcohol, tobacco or illicit drugs  Current medications and supplements including opioid prescriptions.  Functional ability and status Nutritional status Physical activity Advanced directives List of other physicians Hospitalizations, surgeries, and ER visits in previous 12 months Vitals Screenings to include cognitive, depression, and falls Referrals and appointments  In addition, I have reviewed and discussed with patient certain preventive protocols, quality metrics, and best practice recommendations. A written personalized care plan for preventive services as well as general preventive health recommendations were provided to patient.     Sandrea Hammond, LPN   24/40/1027   Nurse Notes: None

## 2020-11-13 ENCOUNTER — Ambulatory Visit (INDEPENDENT_AMBULATORY_CARE_PROVIDER_SITE_OTHER): Payer: Medicare Other | Admitting: Family Medicine

## 2020-11-13 DIAGNOSIS — R399 Unspecified symptoms and signs involving the genitourinary system: Secondary | ICD-10-CM

## 2020-11-14 ENCOUNTER — Other Ambulatory Visit: Payer: Medicare Other

## 2020-11-14 ENCOUNTER — Other Ambulatory Visit: Payer: Self-pay

## 2020-11-14 ENCOUNTER — Encounter: Payer: Self-pay | Admitting: Family Medicine

## 2020-11-14 DIAGNOSIS — R399 Unspecified symptoms and signs involving the genitourinary system: Secondary | ICD-10-CM

## 2020-11-14 LAB — URINALYSIS, ROUTINE W REFLEX MICROSCOPIC
Bilirubin, UA: NEGATIVE
Glucose, UA: NEGATIVE
Ketones, UA: NEGATIVE
Leukocytes,UA: NEGATIVE
Nitrite, UA: NEGATIVE
Protein,UA: NEGATIVE
RBC, UA: NEGATIVE
Specific Gravity, UA: 1.02 (ref 1.005–1.030)
Urobilinogen, Ur: 0.2 mg/dL (ref 0.2–1.0)
pH, UA: 7 (ref 5.0–7.5)

## 2020-11-14 LAB — MICROSCOPIC EXAMINATION
Bacteria, UA: NONE SEEN
RBC, Urine: NONE SEEN /hpf (ref 0–2)

## 2020-11-14 NOTE — Progress Notes (Signed)
   Virtual Visit  Note Due to COVID-19 pandemic this visit was conducted virtually. This visit type was conducted due to national recommendations for restrictions regarding the COVID-19 Pandemic (e.g. social distancing, sheltering in place) in an effort to limit this patient's exposure and mitigate transmission in our community. All issues noted in this document were discussed and addressed.  A physical exam was not performed with this format.  I connected with Anna Bradford on 11/14/20 at 1614 by telephone and verified that I am speaking with the correct person using two identifiers. Anna Bradford is currently located at home and no one is currently with her during the visit. The provider, Gwenlyn Perking, FNP is located in their office at time of visit.  I discussed the limitations, risks, security and privacy concerns of performing an evaluation and management service by telephone and the availability of in person appointments. I also discussed with the patient that there may be a patient responsible charge related to this service. The patient expressed understanding and agreed to proceed.  CC: UTI symptoms  History and Present Illness:  HPI Kayton reports pain on her right side that radiates down to her hip x 4 days. The pain had been intermittent. Today, the pain is constant, in her groin, and is worse with movement today. She also reports urgency and frequency today. She denies dysuria, hematuria, fever, nausea, or vomiting. She is currently on doxycyline for a skin infection. She denies a history of kidney stones but worries this is what is causing her symptoms.     ROS As per HPI.   Observations/Objective: Alert and oriented x 3. Able to speak in full sentences without difficulty.   Assessment and Plan: [name] was seen today for urinary tract infection.  Diagnoses and all orders for this visit:  UTI symptoms She will come in the morning to leave urine for testing. She denies flomax  for empirix treatment of kidney stone today. Discussed to go to ER if symptoms and pain worsen overnight. Push fluids, rest. Tylenol for pain.  -     Urinalysis, Routine w reflex microscopic; Future -     Urine Culture; Future    Follow Up Instructions: Return to office for new or worsening symptoms, or if symptoms persist.     I discussed the assessment and treatment plan with the patient. The patient was provided an opportunity to ask questions and all were answered. The patient agreed with the plan and demonstrated an understanding of the instructions.   The patient was advised to call back or seek an in-person evaluation if the symptoms worsen or if the condition fails to improve as anticipated.  The above assessment and management plan was discussed with the patient. The patient verbalized understanding of and has agreed to the management plan. Patient is aware to call the clinic if symptoms persist or worsen. Patient is aware when to return to the clinic for a follow-up visit. Patient educated on when it is appropriate to go to the emergency department.   Time call ended:  1625  I provided 11 minutes of  non face-to-face time during this encounter.    Gwenlyn Perking, FNP

## 2020-11-18 LAB — URINE CULTURE

## 2020-11-27 ENCOUNTER — Other Ambulatory Visit: Payer: Self-pay

## 2020-11-27 ENCOUNTER — Encounter: Payer: Self-pay | Admitting: General Surgery

## 2020-11-27 ENCOUNTER — Ambulatory Visit (INDEPENDENT_AMBULATORY_CARE_PROVIDER_SITE_OTHER): Payer: Medicare Other | Admitting: General Surgery

## 2020-11-27 VITALS — BP 140/80 | HR 91 | Temp 97.9°F | Resp 18 | Ht 65.0 in | Wt 222.0 lb

## 2020-11-27 DIAGNOSIS — D171 Benign lipomatous neoplasm of skin and subcutaneous tissue of trunk: Secondary | ICD-10-CM

## 2020-11-27 DIAGNOSIS — L988 Other specified disorders of the skin and subcutaneous tissue: Secondary | ICD-10-CM | POA: Diagnosis not present

## 2020-11-27 DIAGNOSIS — L089 Local infection of the skin and subcutaneous tissue, unspecified: Secondary | ICD-10-CM | POA: Diagnosis not present

## 2020-11-27 NOTE — Progress Notes (Signed)
[facility] Procedure Note  11/27/20  Pre procedure Diagnosis: Right flank /lower abdomen lipomas 3cm    Post procedure Diagnosis: Same   Procedure(s) Performed: Excision of lipomas 3cm    Surgeon: Lanell Matar. Constance Haw, MD   Anesthesia: Lidocaine 1%     Specimens: Lipoma    Estimated Blood Loss: Minimal   Blood Replacement: None    Complications: None   Wound Class: Clean    Operative Indications: Ms. Fojtik is a 87 yo with multiple areas of lipoma on her abdomen and torso. We have removed others on her right flank in the past. These cause her discomfort. She wants some other areas removed today.   Findings: Lipomatous lesions    Procedure: The patient was taken to the procedure room and placed on her left side to expose her right lower abdomen. The abdomen on the right lower side was prepared and draped in the usual sterile fashion. Lidocaine 1% was injected into the two areas. Two separate incision were made. With sharp dissection with scissors two lipomatous lesions were removed in their entirety measuring about 3cm in total size. The cavities were made hemostatic. Vicryl interrupted 3-0s were placed to close the cavity. The skin was closed with Monocryl 4-0 Subcuticular sutures and dermabond.   She tolerated th procedure well. The lipomas were sent to pathology.   Tylenol and ibuprofen for pain as needed. Call with questions or concerns. Will call you in 2 weeks to check on you.    Curlene Labrum, MD [facility] [address] [place], [place] [address] [phone] (office)

## 2020-11-27 NOTE — Patient Instructions (Signed)
Tylenol and ibuprofen for pain as needed. Call with questions or concerns. Will call you in 2 weeks to check on you.

## 2020-12-02 LAB — PATHOLOGY REPORT

## 2020-12-02 LAB — TISSUE SPECIMEN

## 2020-12-17 ENCOUNTER — Telehealth (INDEPENDENT_AMBULATORY_CARE_PROVIDER_SITE_OTHER): Payer: Medicare Other | Admitting: General Surgery

## 2020-12-17 DIAGNOSIS — D171 Benign lipomatous neoplasm of skin and subcutaneous tissue of trunk: Secondary | ICD-10-CM

## 2020-12-17 NOTE — Telephone Encounter (Signed)
Noted  

## 2020-12-17 NOTE — Telephone Encounter (Signed)
[facility]  Patient with with pain in the sides and on flank. She reports pressure on this causes pain like from her waistline.   She put some blue imoo on the area. The incisions are healed. Told her to try lidocaine over the counter patches. I told her I am not sure that this is pain from the lipomas and worried this could be deeper pain like muscle or rib pain.  The rib is the most sensitive spot. Told her to try the lidocaine patches.  She describes her submental neck pain/ lump. She has a PCP appt next week. I told her to discuss with Dr. Warrick Parisian as they had obtained an Xray in the past but she may need more imaging.  CT from April of the abdomen was reviewed and nothing obvious that would be causing the pain. She is going to try the lidocaine patches.  Curlene Labrum, MD [facility] [address] [place], [place] [address] [phone] (office)

## 2020-12-17 NOTE — Telephone Encounter (Signed)
Patient and MD had discussion. Please see new note.

## 2020-12-17 NOTE — Telephone Encounter (Signed)
[facility]  Called patient and left message regarding lipoma excision and pathology. All pathology benign. Told her to call the office to let us know if she is having issues.  Curlene Labrum, MD [facility] [address] [place], [place] [address] [phone] (office)

## 2020-12-22 ENCOUNTER — Ambulatory Visit (INDEPENDENT_AMBULATORY_CARE_PROVIDER_SITE_OTHER): Payer: Medicare Other | Admitting: Family Medicine

## 2020-12-22 ENCOUNTER — Encounter: Payer: Self-pay | Admitting: Family Medicine

## 2020-12-22 DIAGNOSIS — N3090 Cystitis, unspecified without hematuria: Secondary | ICD-10-CM | POA: Diagnosis not present

## 2020-12-22 LAB — URINALYSIS, COMPLETE
Bilirubin, UA: NEGATIVE
Glucose, UA: NEGATIVE
Nitrite, UA: NEGATIVE
Protein,UA: NEGATIVE
RBC, UA: NEGATIVE
Specific Gravity, UA: 1.02 (ref 1.005–1.030)
Urobilinogen, Ur: 0.2 mg/dL (ref 0.2–1.0)
pH, UA: 6 (ref 5.0–7.5)

## 2020-12-22 LAB — MICROSCOPIC EXAMINATION
RBC, Urine: NONE SEEN /hpf (ref 0–2)
Renal Epithel, UA: NONE SEEN /hpf

## 2020-12-22 MED ORDER — AMOXICILLIN 500 MG PO CAPS: 500.0000 mg | ORAL_CAPSULE | Freq: Three times a day (TID) | ORAL | 0 refills | Status: DC

## 2020-12-22 NOTE — Progress Notes (Signed)
Subjective:    Patient ID: Anna Bradford, female    DOB: June 22, 1933, 87 y.o.   MRN: [id number]   HPI: Anna Bradford is a 87 y.o. female presenting for burning with urination and frequency for several days. Denies fever . No flank pain. Some nausea, no vomiting.Onset 3-4 days ago. Used Azo for temporary relief.    Depression screen [facility] 2/9 11/10/2020 09/25/2020 07/31/2020 06/25/2020 04/24/2020  Decreased Interest 0 0 0 0 0  Down, Depressed, Hopeless 0 0 0 0 0  PHQ - 2 Score 0 0 0 0 0     Relevant past medical, surgical, family and social history reviewed and updated as indicated.  Interim medical history since our last visit reviewed. Allergies and medications reviewed and updated.  ROS:  Review of Systems  Constitutional:  Negative for chills, diaphoresis and fever.  HENT:  Negative for congestion.   Eyes:  Negative for visual disturbance.  Respiratory:  Negative for cough and shortness of breath.   Cardiovascular:  Negative for chest pain and palpitations.  Gastrointestinal:  Negative for constipation, diarrhea and nausea.  Genitourinary:  Positive for dysuria, frequency and urgency. Negative for decreased urine volume, flank pain, hematuria, menstrual problem and pelvic pain.  Musculoskeletal:  Negative for arthralgias and joint swelling.  Skin:  Negative for rash.  Neurological:  Negative for dizziness and numbness.    Social History   Tobacco Use  Smoking Status Never  Smokeless Tobacco Never       Objective:     Wt Readings from Last 3 Encounters:  11/27/20 222 lb (100.7 kg)  11/10/20 226 lb (102.5 kg)  10/30/20 226 lb (102.5 kg)     Exam deferred. Pt. Harboring due to COVID 19. Phone visit performed.   Assessment & Plan:   1. Cystitis     Meds ordered this encounter  Medications   amoxicillin (AMOXIL) 500 MG capsule    Sig: Take 1 capsule (500 mg total) by mouth 3 (three) times daily.    Dispense:  21 capsule    Refill:  0    Orders Placed This  Encounter  Procedures   Urine Culture   Microscopic Examination   Urinalysis, Complete      Diagnoses and all orders for this visit:  Cystitis -     Urinalysis, Complete -     Urine Culture  Other orders -     Microscopic Examination -     amoxicillin (AMOXIL) 500 MG capsule; Take 1 capsule (500 mg total) by mouth 3 (three) times daily.   Virtual Visit via telephone Note  I discussed the limitations, risks, security and privacy concerns of performing an evaluation and management service by telephone and the availability of in person appointments. The patient was identified with two identifiers. Pt.expressed understanding and agreed to proceed. Pt. Is at home. Dr. Livia Snellen is in his office.  Follow Up Instructions:   I discussed the assessment and treatment plan with the patient. The patient was provided an opportunity to ask questions and all were answered. The patient agreed with the plan and demonstrated an understanding of the instructions.   The patient was advised to call back or seek an in-person evaluation if the symptoms worsen or if the condition fails to improve as anticipated.   Total minutes including chart review and phone contact time: 11   Follow up plan: Return if symptoms worsen or fail to improve.  Claretta Fraise, MD [place]

## 2020-12-24 LAB — URINE CULTURE

## 2021-01-22 ENCOUNTER — Ambulatory Visit: Payer: Medicare Other | Admitting: Orthopaedic Surgery

## 2021-01-22 ENCOUNTER — Encounter: Payer: Self-pay | Admitting: Orthopaedic Surgery

## 2021-01-22 ENCOUNTER — Ambulatory Visit: Payer: Medicare Other

## 2021-01-22 ENCOUNTER — Other Ambulatory Visit: Payer: Self-pay

## 2021-01-22 VITALS — BP 186/94 | HR 78 | Ht 65.0 in | Wt 225.6 lb

## 2021-01-22 DIAGNOSIS — M79604 Pain in right leg: Secondary | ICD-10-CM

## 2021-01-22 DIAGNOSIS — M5442 Lumbago with sciatica, left side: Secondary | ICD-10-CM

## 2021-01-22 DIAGNOSIS — M7061 Trochanteric bursitis, right hip: Secondary | ICD-10-CM | POA: Diagnosis not present

## 2021-01-22 DIAGNOSIS — G8929 Other chronic pain: Secondary | ICD-10-CM

## 2021-01-22 NOTE — Progress Notes (Signed)
I hurt different areas.  She has lower back pain that is bothering her first thing in the morning.  It takes her a while to get going.  She has no trauma, no weakness.  She has pain of the right mid medial tibia at night, only at night.  The pain awakens her but she is able to go back to sleep.  She has no trauma, no swelling, no redness.  She has right sided trochanteric pain again.  She has no trauma.  She has a small swelling of the right upper neck near the jaw.  She saw her family doctor about this recently.  She is to see him again tomorrow. I suggested she see ENT as well.  Spine/Pelvis examination:  Inspection:  Overall, sacoiliac joint benign and hips nontender; without crepitus or defects.   Thoracic spine inspection: Alignment normal without kyphosis present   Lumbar spine inspection:  Alignment  with normal lumbar lordosis, without scoliosis apparent.   Thoracic spine palpation:  without tenderness of spinal processes   Lumbar spine palpation: without tenderness of lumbar area; without tightness of lumbar muscles    Range of Motion:   Lumbar flexion, forward flexion is normal without pain or tenderness    Lumbar extension is full without pain or tenderness   Left lateral bend is normal without pain or tenderness   Right lateral bend is normal without pain or tenderness   Straight leg raising is normal  Strength & tone: normal   Stability overall normal stability  Right lower leg has no tenderness, she has no defect, no swelling.  Right knee has some effusion and crepitus.  Right hip has pain over the trochanteric area, ROM of the hip is full.  No redness or weakness.  X-rays were done of the right tibia, reported separately.  Encounter Diagnoses  Name Primary?   Pain in right leg Yes   Greater trochanteric bursitis of right hip    Chronic left-sided low back pain with left-sided sciatica    PROCEDURE NOTE:  The patient request injection, verbal consent was  obtained.  The right trochanteric area of the hip was prepped appropriately after time out was performed.   Sterile technique was observed and injection of 1 cc of DepoMedrol 40 mg with several cc's of plain xylocaine. Anesthesia was provided by ethyl chloride and a 20-gauge needle was used to inject the hip area. The injection was tolerated well.  A band aid dressing was applied.  The patient was advised to apply ice later today and tomorrow to the injection sight as needed.  I have given instructions for a lift to put her foot on while at the sink or modify her stool.  I have told her to take Advil two tablets three times a day after eating.  I will see her in six weeks.  Call if any problem.  Precautions discussed.  Electronically Signed Sanjuana Kava, MD 1/5/202311:02 AM

## 2021-01-28 ENCOUNTER — Other Ambulatory Visit: Payer: Self-pay | Admitting: Family Medicine

## 2021-01-30 ENCOUNTER — Ambulatory Visit (INDEPENDENT_AMBULATORY_CARE_PROVIDER_SITE_OTHER): Payer: Medicare Other | Admitting: Family Medicine

## 2021-01-30 ENCOUNTER — Encounter: Payer: Self-pay | Admitting: Family Medicine

## 2021-01-30 VITALS — BP 145/82 | HR 78 | Ht 65.0 in | Wt 223.0 lb

## 2021-01-30 DIAGNOSIS — I10 Essential (primary) hypertension: Secondary | ICD-10-CM | POA: Diagnosis not present

## 2021-01-30 DIAGNOSIS — K219 Gastro-esophageal reflux disease without esophagitis: Secondary | ICD-10-CM | POA: Diagnosis not present

## 2021-01-30 DIAGNOSIS — Z23 Encounter for immunization: Secondary | ICD-10-CM

## 2021-01-30 DIAGNOSIS — R590 Localized enlarged lymph nodes: Secondary | ICD-10-CM | POA: Diagnosis not present

## 2021-01-30 DIAGNOSIS — N3281 Overactive bladder: Secondary | ICD-10-CM

## 2021-01-30 NOTE — Progress Notes (Signed)
BP (!) 145/82    Pulse 78    Ht _0  (1.651 m)    Wt 223 lb (101.2 kg)    SpO2 96%    BMI 37.11 kg/m    Subjective:   Patient ID: Anna Bradford, female    DOB: [birth year], 88 y.o.   MRN: [id number]  HPI: Anna Bradford is a 88 y.o. female presenting on 01/30/2021 for Medical Management of Chronic Issues and Hypertension   HPI Hypertension Patient is currently on amlodipine and losartan hydrochlorothiazide, and their blood pressure today is 145/82. Patient denies any lightheadedness or dizziness. Patient denies headaches, blurred vision, chest pains, shortness of breath, or weakness. Denies any side effects from medication and is content with current medication.   GERD Patient is currently on famotidine.  She denies any major symptoms or abdominal pain or belching or burping. She denies any blood in her stool or lightheadedness or dizziness.   Patient comes in complaining of still having a lump on the right side of her neck under her jawline that has been there for quite a few months now.  She says its not gone down.  Not tender to palpation but it is just been there and bothers her.  She feels aches when she turns her head in certain ways that it catches.  She denies any cough congestion sore throat runny nose.  Relevant past medical, surgical, family and social history reviewed and updated as indicated. Interim medical history since our last visit reviewed. Allergies and medications reviewed and updated.  Review of Systems  Constitutional:  Negative for chills and fever.  HENT:  Negative for congestion, ear discharge and ear pain.   Eyes:  Negative for visual disturbance.  Respiratory:  Negative for cough, chest tightness and shortness of breath.   Cardiovascular:  Negative for chest pain and leg swelling.  Genitourinary:  Negative for difficulty urinating and dysuria.  Musculoskeletal:  Negative for back pain and gait problem.  Skin:  Negative for rash.  Neurological:  Negative for  light-headedness and headaches.  Psychiatric/Behavioral:  Negative for agitation and behavioral problems.   All other systems reviewed and are negative.  Per HPI unless specifically indicated above   Allergies as of 01/30/2021       Reactions   Levofloxacin    Other reaction(s): Other (See Comments) tendonitis   Nitrofurantoin    Other reaction(s): Other (See Comments) Bleeding thru bowels Bleeding thru bowels   Macrolides And Ketolides    Bleeding thru bowels   Codeine Nausea And Vomiting   Sulfa Antibiotics Nausea And Vomiting        Medication List        Accurate as of January 30, 2021 10:39 AM. If you have any questions, ask your nurse or doctor.          STOP taking these medications    amoxicillin 500 MG capsule Commonly known as: AMOXIL Stopped by: Worthy Rancher, MD   dexamethasone 2 MG tablet Commonly known as: DECADRON Stopped by: Worthy Rancher, MD   doxycycline 100 MG tablet Commonly known as: VIBRA-TABS Stopped by: Worthy Rancher, MD   mirabegron ER 25 MG Tb24 tablet Commonly known as: Myrbetriq Stopped by: Worthy Rancher, MD       TAKE these medications    albuterol 108 (90 Base) MCG/ACT inhaler Commonly known as: VENTOLIN HFA Inhale 2 puffs into the lungs every 6 (six) hours as needed for wheezing or shortness of breath.  amLODipine 10 MG tablet Commonly known as: NORVASC TAKE 1 TABLET BY MOUTH  DAILY   aspirin 81 MG tablet Take 81 mg by mouth daily.   baclofen 10 MG tablet Commonly known as: LIORESAL Take 1 tablet (10 mg total) by mouth at bedtime as needed for muscle spasms.   CALCIUM-MAGNESIUM-ZINC-D3 PO Take 1 tablet by mouth daily.   clobetasol cream 0.05 % Commonly known as: TEMOVATE Apply topically 2 (two) times daily.   diphenhydrAMINE 25 MG tablet Commonly known as: BENADRYL Take 25 mg by mouth daily as needed (allergies).   famotidine 20 MG tablet Commonly known as: Pepcid Take 1 tablet (20  mg total) by mouth 2 (two) times daily.   HYDROcodone-acetaminophen 5-325 MG tablet Commonly known as: Norco Take 1-2 tablets by mouth every 6 (six) hours as needed.   ibuprofen 200 MG tablet Commonly known as: ADVIL Take 800 mg by mouth 2 (two) times daily.   ipratropium 0.06 % nasal spray Commonly known as: ATROVENT Place 2 sprays into both nostrils 3 (three) times daily.   lidocaine 2 % solution Commonly known as: XYLOCAINE SMARTSIG:10 Milliliter(s) By Mouth Every 3 Hours PRN   losartan-hydrochlorothiazide 100-25 MG tablet Commonly known as: HYZAAR Take 1 tablet by mouth daily.   magnesium hydroxide 400 MG/5ML suspension Commonly known as: MILK OF MAGNESIA Take by mouth as needed for mild constipation.   multivitamin tablet Take 1 tablet by mouth daily.   nystatin cream Commonly known as: MYCOSTATIN Apply 1 application topically daily.   Potassium 99 MG Tabs Take 1 tablet by mouth daily.   rOPINIRole 2 MG tablet Commonly known as: REQUIP Take 1 tablet (2 mg total) by mouth at bedtime. For leg cramps   SYSTANE OP Place 1 drop into both eyes daily as needed (dry eyes).   triamcinolone cream 0.1 % Commonly known as: KENALOG Apply 1 application topically 2 (two) times daily.   vitamin C 1000 MG tablet Take 1,000 mg by mouth daily.   Vitamin D-3 25 MCG (1000 UT) Caps Take 2,000 Units by mouth daily.   zinc gluconate 50 MG tablet Take 50 mg by mouth daily.         Objective:   BP (!) 145/82    Pulse 78    Ht _0  (1.651 m)    Wt 223 lb (101.2 kg)    SpO2 96%    BMI 37.11 kg/m   Wt Readings from Last 3 Encounters:  01/30/21 223 lb (101.2 kg)  01/22/21 225 lb 9.6 oz (102.3 kg)  11/27/20 222 lb (100.7 kg)    Physical Exam Vitals and nursing note reviewed.  Constitutional:      General: She is not in acute distress.    Appearance: She is well-developed. She is not diaphoretic.  Eyes:     Conjunctiva/sclera: Conjunctivae normal.  Cardiovascular:      Rate and Rhythm: Normal rate and regular rhythm.     Heart sounds: Normal heart sounds. No murmur heard. Pulmonary:     Effort: Pulmonary effort is normal. No respiratory distress.     Breath sounds: Normal breath sounds. No wheezing.  Musculoskeletal:        General: No tenderness. Normal range of motion.  Lymphadenopathy:     Head:     Right side of head: Submandibular (Small lymph node on right, mobile, nontender) adenopathy present.     Left side of head: No submandibular adenopathy.  Skin:    General: Skin is warm and dry.  Findings: No rash.  Neurological:     Mental Status: She is alert and oriented to person, place, and time.     Coordination: Coordination normal.  Psychiatric:        Behavior: Behavior normal.      Assessment & Plan:   Problem List Items Addressed This Visit       Cardiovascular and Mediastinum   Essential hypertension, benign - Primary   Relevant Orders   CBC with Differential/Platelet   CMP14+EGFR   Lipid panel     Digestive   GERD (gastroesophageal reflux disease)   Relevant Orders   CBC with Differential/Platelet     Immune and Lymphatic   Submental adenopathy   Relevant Orders   CT Soft Tissue Neck W Contrast   Other Visit Diagnoses     OAB (overactive bladder)       Need for shingles vaccine       Relevant Orders   Varicella-zoster vaccine IM (Shingrix) (Completed)      Will order CT head and neck for right side of neck lump. Follow up plan: Return in about 3 months (around 04/30/2021), or if symptoms worsen or fail to improve, for htn recheck.  Counseling provided for all of the vaccine components Orders Placed This Encounter  Procedures   CT Soft Tissue Neck W Contrast   Varicella-zoster vaccine IM (Shingrix)   CBC with Differential/Platelet   CMP14+EGFR   Lipid panel    Caryl Pina, MD [facility] 01/30/2021, 10:39 AM

## 2021-01-31 LAB — LIPID PANEL
Chol/HDL Ratio: 4.6 ratio — ABNORMAL HIGH (ref 0.0–4.4)
Cholesterol, Total: 174 mg/dL (ref 100–199)
HDL: 38 mg/dL — ABNORMAL LOW (ref >39)
LDL Chol Calc (NIH): 103 mg/dL — ABNORMAL HIGH (ref 0–99)
Triglycerides: 191 mg/dL — ABNORMAL HIGH (ref 0–149)
VLDL Cholesterol Cal: 33 mg/dL (ref 5–40)

## 2021-01-31 LAB — CBC WITH DIFFERENTIAL/PLATELET
Basophils Absolute: 0.1 10*3/uL (ref 0.0–0.2)
Basos: 1 %
EOS (ABSOLUTE): 0.3 10*3/uL (ref 0.0–0.4)
Eos: 4 %
Hematocrit: 43.3 % (ref 34.0–46.6)
Hemoglobin: 15.1 g/dL (ref 11.1–15.9)
Immature Grans (Abs): 0 10*3/uL (ref 0.0–0.1)
Immature Granulocytes: 1 %
Lymphocytes Absolute: 2.1 10*3/uL (ref 0.7–3.1)
Lymphs: 24 %
MCH: 31.5 pg (ref 26.6–33.0)
MCHC: 34.9 g/dL (ref 31.5–35.7)
MCV: 90 fL (ref 79–97)
Monocytes Absolute: 0.9 10*3/uL (ref 0.1–0.9)
Monocytes: 10 %
Neutrophils Absolute: 5.2 10*3/uL (ref 1.4–7.0)
Neutrophils: 60 %
Platelets: 259 10*3/uL (ref 150–450)
RBC: 4.8 x10E6/uL (ref 3.77–5.28)
RDW: 13.4 % (ref 11.7–15.4)
WBC: 8.5 10*3/uL (ref 3.4–10.8)

## 2021-01-31 LAB — CMP14+EGFR
ALT: 16 IU/L (ref 0–32)
AST: 13 IU/L (ref 0–40)
Albumin/Globulin Ratio: 1.7 (ref 1.2–2.2)
Albumin: 4.1 g/dL (ref 3.6–4.6)
Alkaline Phosphatase: 76 IU/L (ref 44–121)
BUN/Creatinine Ratio: 23 (ref 12–28)
BUN: 19 mg/dL (ref 8–27)
Bilirubin Total: 0.5 mg/dL (ref 0.0–1.2)
CO2: 27 mmol/L (ref 20–29)
Calcium: 9.4 mg/dL (ref 8.7–10.3)
Chloride: 102 mmol/L (ref 96–106)
Creatinine, Ser: 0.82 mg/dL (ref 0.57–1.00)
Globulin, Total: 2.4 g/dL (ref 1.5–4.5)
Glucose: 74 mg/dL (ref 70–99)
Potassium: 4 mmol/L (ref 3.5–5.2)
Sodium: 143 mmol/L (ref 134–144)
Total Protein: 6.5 g/dL (ref 6.0–8.5)
eGFR: 70 mL/min/{1.73_m2} (ref >59)

## 2021-02-23 ENCOUNTER — Telehealth: Payer: Self-pay | Admitting: Family Medicine

## 2021-02-23 NOTE — Telephone Encounter (Signed)
Patient is wanting to talk to [facility] about getting an MRI. Please call back and advise.

## 2021-02-23 NOTE — Telephone Encounter (Signed)
Patient complains of burning in bottoms of feet and legs and wants to be seen.  Appointment scheduled for 02/25/21 with Dr. Warrick Parisian.

## 2021-02-25 ENCOUNTER — Encounter: Payer: Self-pay | Admitting: Family Medicine

## 2021-02-25 ENCOUNTER — Ambulatory Visit (INDEPENDENT_AMBULATORY_CARE_PROVIDER_SITE_OTHER): Payer: Medicare Other | Admitting: Family Medicine

## 2021-02-25 VITALS — BP 145/67 | HR 82 | Ht 65.0 in | Wt 226.0 lb

## 2021-02-25 DIAGNOSIS — M792 Neuralgia and neuritis, unspecified: Secondary | ICD-10-CM | POA: Diagnosis not present

## 2021-02-25 MED ORDER — AMITRIPTYLINE HCL 50 MG PO TABS: 50.0000 mg | ORAL_TABLET | Freq: Every day | ORAL | 1 refills | Status: DC

## 2021-02-25 NOTE — Progress Notes (Signed)
BP (!) 145/67    Pulse 82    Ht _0  (1.651 m)    Wt 226 lb (102.5 kg)    SpO2 94%    BMI 37.61 kg/m    Subjective:   Patient ID: Anna Bradford, female    DOB: 10-02-1933, 88 y.o.   MRN: [id number]  HPI: Anna Bradford is a 88 y.o. female presenting on 02/25/2021 for Burning in BLE   HPI Anna Bradford states the burning is all over her body at different times but mostly confined to her feet and legs. She states the sensation is off and on throughout the day. It is worse when sitting and at night. Walking helps the pain some as well as ibuprofen. She states she fell recently after standing up out of her chair. She says her right leg "just gave out". She rates her pain at it's worst as an 8 out of 10.  She says the burning is sporadic and sometimes in her chest and sometimes in her shoulder and sometimes in her arms and sometimes in her feet and her legs and has been something that she has been dealing with for a while but says its worse recently.  Relevant past medical, surgical, family and social history reviewed and updated as indicated. Interim medical history since our last visit reviewed. Allergies and medications reviewed and updated.  Review of Systems  Constitutional:  Negative for chills and fever.  Musculoskeletal:  Positive for arthralgias, back pain and myalgias. Negative for gait problem.  Neurological:  Positive for numbness. Negative for dizziness and weakness.  MSK - admits numbness, burning; denies tingling, weakness  Per HPI unless specifically indicated above   Allergies as of 02/25/2021       Reactions   Levofloxacin    Other reaction(s): Other (See Comments) tendonitis   Nitrofurantoin    Other reaction(s): Other (See Comments) Bleeding thru bowels Bleeding thru bowels   Macrolides And Ketolides    Bleeding thru bowels   Codeine Nausea And Vomiting   Sulfa Antibiotics Nausea And Vomiting        Medication List        Accurate as of February 25, 2021 11:59 PM. If  you have any questions, ask your nurse or doctor.          albuterol 108 (90 Base) MCG/ACT inhaler Commonly known as: VENTOLIN HFA Inhale 2 puffs into the lungs every 6 (six) hours as needed for wheezing or shortness of breath.   amitriptyline 50 MG tablet Commonly known as: ELAVIL Take 1 tablet (50 mg total) by mouth at bedtime. Started by: Fransisca Kaufmann Albirta Rhinehart, MD   amLODipine 10 MG tablet Commonly known as: NORVASC TAKE 1 TABLET BY MOUTH  DAILY   aspirin 81 MG tablet Take 81 mg by mouth daily.   baclofen 10 MG tablet Commonly known as: LIORESAL Take 1 tablet (10 mg total) by mouth at bedtime as needed for muscle spasms.   CALCIUM-MAGNESIUM-ZINC-D3 PO Take 1 tablet by mouth daily.   clobetasol cream 0.05 % Commonly known as: TEMOVATE Apply topically 2 (two) times daily.   diphenhydrAMINE 25 MG tablet Commonly known as: BENADRYL Take 25 mg by mouth daily as needed (allergies).   famotidine 20 MG tablet Commonly known as: Pepcid Take 1 tablet (20 mg total) by mouth 2 (two) times daily.   HYDROcodone-acetaminophen 5-325 MG tablet Commonly known as: Norco Take 1-2 tablets by mouth every 6 (six) hours as needed.   ibuprofen 200  MG tablet Commonly known as: ADVIL Take 800 mg by mouth 2 (two) times daily.   ipratropium 0.06 % nasal spray Commonly known as: ATROVENT Place 2 sprays into both nostrils 3 (three) times daily.   lidocaine 2 % solution Commonly known as: XYLOCAINE SMARTSIG:10 Milliliter(s) By Mouth Every 3 Hours PRN   losartan-hydrochlorothiazide 100-25 MG tablet Commonly known as: HYZAAR Take 1 tablet by mouth daily.   magnesium hydroxide 400 MG/5ML suspension Commonly known as: MILK OF MAGNESIA Take by mouth as needed for mild constipation.   multivitamin tablet Take 1 tablet by mouth daily.   nystatin cream Commonly known as: MYCOSTATIN Apply 1 application topically daily.   Potassium 99 MG Tabs Take 1 tablet by mouth daily.    rOPINIRole 2 MG tablet Commonly known as: REQUIP Take 1 tablet (2 mg total) by mouth at bedtime. For leg cramps   SYSTANE OP Place 1 drop into both eyes daily as needed (dry eyes).   triamcinolone cream 0.1 % Commonly known as: KENALOG Apply 1 application topically 2 (two) times daily.   vitamin C 1000 MG tablet Take 1,000 mg by mouth daily.   Vitamin D-3 25 MCG (1000 UT) Caps Take 2,000 Units by mouth daily.   zinc gluconate 50 MG tablet Take 50 mg by mouth daily.         Objective:   BP (!) 145/67    Pulse 82    Ht _0  (1.651 m)    Wt 226 lb (102.5 kg)    SpO2 94%    BMI 37.61 kg/m   Wt Readings from Last 3 Encounters:  02/25/21 226 lb (102.5 kg)  01/30/21 223 lb (101.2 kg)  01/22/21 225 lb 9.6 oz (102.3 kg)    Physical Exam Vitals and nursing note reviewed.  Constitutional:      Appearance: Normal appearance. She is obese.  Cardiovascular:     Rate and Rhythm: Normal rate and regular rhythm.     Heart sounds: No murmur heard. Pulmonary:     Effort: Pulmonary effort is normal. No respiratory distress.     Breath sounds: Normal breath sounds. No wheezing or rhonchi.  Musculoskeletal:        General: Swelling (1+ edema) present.  Neurological:     Mental Status: She is alert.   MSK - sensation in feet was normal, no sores, positive for edema Cardio - regular rate/rhythm no murmurs Pulm - lungs clear to auscultation bil    Assessment & Plan:   Problem List Items Addressed This Visit   None Visit Diagnoses     Neuropathic pain    -  Primary   Relevant Orders   CBC with Differential/Platelet (Completed)   CMP14+EGFR (Completed)   TSH (Completed)   Vitamin B12 (Completed)       We will do blood work but may consider neurology referral in the future. Follow up plan: Return if symptoms worsen or fail to improve, for 2 to 51-monthfollow-up for neuropathy.  Counseling provided for all of the vaccine components Orders Placed This Encounter   Procedures   CBC with Differential/Platelet   CMP14+EGFR   TSH   Vitamin B12    TLeanor Kail PA-S [facility] 03/03/2021, 12:11 PM  I was personally present for all components of the history, physical exam and/or medical decision making.  I agree with the documentation performed by the PA student and agree with assessment and plan above.  PA student was TWarehouse manager JCaryl Pina  MD [facility] 03/03/2021, 12:11 PM

## 2021-02-26 LAB — CMP14+EGFR
ALT: 20 IU/L (ref 0–32)
AST: 17 IU/L (ref 0–40)
Albumin/Globulin Ratio: 1.8 (ref 1.2–2.2)
Albumin: 4.1 g/dL (ref 3.6–4.6)
Alkaline Phosphatase: 89 IU/L (ref 44–121)
BUN/Creatinine Ratio: 19 (ref 12–28)
BUN: 14 mg/dL (ref 8–27)
Bilirubin Total: 0.3 mg/dL (ref 0.0–1.2)
CO2: 26 mmol/L (ref 20–29)
Calcium: 9.1 mg/dL (ref 8.7–10.3)
Chloride: 100 mmol/L (ref 96–106)
Creatinine, Ser: 0.74 mg/dL (ref 0.57–1.00)
Globulin, Total: 2.3 g/dL (ref 1.5–4.5)
Glucose: 120 mg/dL — ABNORMAL HIGH (ref 70–99)
Potassium: 3.8 mmol/L (ref 3.5–5.2)
Sodium: 142 mmol/L (ref 134–144)
Total Protein: 6.4 g/dL (ref 6.0–8.5)
eGFR: 79 mL/min/{1.73_m2} (ref >59)

## 2021-02-26 LAB — CBC WITH DIFFERENTIAL/PLATELET
Basophils Absolute: 0.1 10*3/uL (ref 0.0–0.2)
Basos: 1 %
EOS (ABSOLUTE): 0.3 10*3/uL (ref 0.0–0.4)
Eos: 4 %
Hematocrit: 42.4 % (ref 34.0–46.6)
Hemoglobin: 14.1 g/dL (ref 11.1–15.9)
Immature Grans (Abs): 0 10*3/uL (ref 0.0–0.1)
Immature Granulocytes: 0 %
Lymphocytes Absolute: 2.1 10*3/uL (ref 0.7–3.1)
Lymphs: 29 %
MCH: 30.1 pg (ref 26.6–33.0)
MCHC: 33.3 g/dL (ref 31.5–35.7)
MCV: 91 fL (ref 79–97)
Monocytes Absolute: 0.7 10*3/uL (ref 0.1–0.9)
Monocytes: 10 %
Neutrophils Absolute: 4.2 10*3/uL (ref 1.4–7.0)
Neutrophils: 56 %
Platelets: 259 10*3/uL (ref 150–450)
RBC: 4.68 x10E6/uL (ref 3.77–5.28)
RDW: 13.5 % (ref 11.7–15.4)
WBC: 7.5 10*3/uL (ref 3.4–10.8)

## 2021-02-26 LAB — TSH: TSH: 1.45 u[IU]/mL (ref 0.450–4.500)

## 2021-02-26 LAB — VITAMIN B12: Vitamin B-12: 661 pg/mL (ref 232–1245)

## 2021-03-03 ENCOUNTER — Telehealth: Payer: Self-pay | Admitting: Family Medicine

## 2021-03-03 MED ORDER — AMITRIPTYLINE HCL 50 MG PO TABS: 50.0000 mg | ORAL_TABLET | Freq: Every day | ORAL | 0 refills | Status: DC

## 2021-03-03 NOTE — Telephone Encounter (Signed)
Pt aware refill sent to [facility]

## 2021-03-03 NOTE — Telephone Encounter (Signed)
°  Prescription Request  03/03/2021  Is this a "Controlled Substance" medicine? no  Have you seen your PCP in the last 2 weeks? yes  If YES, route message to pool  -  If NO, patient needs to be scheduled for appointment.  What is the name of the medication or equipment? amitriptyline (ELAVIL) 50 MG tablet  Have you contacted your pharmacy to request a refill? Yes, [facility] does not have in stock   Which pharmacy would you like this sent to? [facility]    Patient notified that their request is being sent to the clinical staff for review and that they should receive a response within 2 business days.

## 2021-03-05 ENCOUNTER — Encounter: Payer: Self-pay | Admitting: Orthopaedic Surgery

## 2021-03-05 ENCOUNTER — Ambulatory Visit: Payer: Medicare Other

## 2021-03-05 ENCOUNTER — Ambulatory Visit: Payer: Medicare Other | Admitting: Orthopaedic Surgery

## 2021-03-05 ENCOUNTER — Other Ambulatory Visit: Payer: Self-pay

## 2021-03-05 VITALS — BP 174/81 | HR 80 | Ht 65.0 in | Wt 226.0 lb

## 2021-03-05 DIAGNOSIS — G8929 Other chronic pain: Secondary | ICD-10-CM

## 2021-03-05 DIAGNOSIS — M5441 Lumbago with sciatica, right side: Secondary | ICD-10-CM | POA: Diagnosis not present

## 2021-03-05 DIAGNOSIS — M545 Low back pain, unspecified: Secondary | ICD-10-CM

## 2021-03-05 NOTE — Addendum Note (Signed)
Addended by: Elizabeth Sauer on: 03/05/2021 11:06 AM   Modules accepted: Orders

## 2021-03-05 NOTE — Progress Notes (Signed)
My back is hurting more  She has less hip and side pain but more pain in the right lower back and pain running down the right leg.  She has periods of pain when she can do nothing.  She has no new trauma, no weakness, no redness.  She is taking ibuprofen 600 twice a day.  I have told her to increase to three times a day.    I will get MRI as she is not improving.  Spine/Pelvis examination:  Inspection:  Overall, sacoiliac joint benign and hips nontender; without crepitus or defects.   Thoracic spine inspection: Alignment normal without kyphosis present   Lumbar spine inspection:  Alignment  with normal lumbar lordosis, without scoliosis apparent.   Thoracic spine palpation:  without tenderness of spinal processes   Lumbar spine palpation: with tenderness of lumbar area; with tightness of lumbar muscles    Range of Motion:   Lumbar flexion, forward flexion is 30 with pain or tenderness    Lumbar extension is full without pain or tenderness   Left lateral bend is Normal  without pain or tenderness   Right lateral bend is Normal without pain or tenderness   Straight leg raising is Abnormal- 30 degrees right   Strength & tone: Normal   Stability overall normal stability    Encounter Diagnosis  Name Primary?   Chronic right-sided low back pain with right-sided sciatica Yes   Get MRI.  Return in two weeks.  Call if any problem.  Precautions discussed.  Electronically Signed Sanjuana Kava, MD 2/16/202310:53 AM

## 2021-03-11 ENCOUNTER — Ambulatory Visit (HOSPITAL_COMMUNITY)
Admission: RE | Admit: 2021-03-11 | Discharge: 2021-03-11 | Disposition: A | Discharge status: Home / Self Care | Payer: Medicare Other | Source: Ambulatory Visit | Attending: Family Medicine | Admitting: Family Medicine

## 2021-03-11 ENCOUNTER — Other Ambulatory Visit: Payer: Self-pay

## 2021-03-11 DIAGNOSIS — I771 Stricture of artery: Secondary | ICD-10-CM | POA: Diagnosis not present

## 2021-03-11 DIAGNOSIS — R590 Localized enlarged lymph nodes: Secondary | ICD-10-CM | POA: Insufficient documentation

## 2021-03-11 DIAGNOSIS — I6529 Occlusion and stenosis of unspecified carotid artery: Secondary | ICD-10-CM | POA: Diagnosis not present

## 2021-03-11 DIAGNOSIS — M50322 Other cervical disc degeneration at C5-C6 level: Secondary | ICD-10-CM | POA: Diagnosis not present

## 2021-03-11 MED ORDER — IOHEXOL 300 MG/ML  SOLN
75.0000 mL | Freq: Once | INTRAMUSCULAR | Status: AC | PRN
  Administered 2021-03-11: 75 mL via INTRAVENOUS

## 2021-03-15 ENCOUNTER — Other Ambulatory Visit: Payer: Medicare Other

## 2021-03-19 ENCOUNTER — Ambulatory Visit: Payer: Medicare Other | Admitting: Orthopaedic Surgery

## 2021-03-23 ENCOUNTER — Ambulatory Visit
Admission: RE | Admit: 2021-03-23 | Discharge: 2021-03-23 | Disposition: A | Discharge status: Home / Self Care | Payer: Medicare Other | Source: Ambulatory Visit | Attending: Orthopaedic Surgery | Admitting: Orthopaedic Surgery

## 2021-03-23 DIAGNOSIS — M5441 Lumbago with sciatica, right side: Secondary | ICD-10-CM

## 2021-03-23 DIAGNOSIS — M25551 Pain in right hip: Secondary | ICD-10-CM | POA: Diagnosis not present

## 2021-03-23 DIAGNOSIS — M545 Low back pain, unspecified: Secondary | ICD-10-CM | POA: Diagnosis not present

## 2021-03-23 DIAGNOSIS — M47816 Spondylosis without myelopathy or radiculopathy, lumbar region: Secondary | ICD-10-CM | POA: Diagnosis not present

## 2021-03-23 DIAGNOSIS — M4807 Spinal stenosis, lumbosacral region: Secondary | ICD-10-CM | POA: Diagnosis not present

## 2021-03-23 DIAGNOSIS — G8929 Other chronic pain: Secondary | ICD-10-CM

## 2021-03-23 DIAGNOSIS — M48061 Spinal stenosis, lumbar region without neurogenic claudication: Secondary | ICD-10-CM | POA: Diagnosis not present

## 2021-03-26 ENCOUNTER — Other Ambulatory Visit: Payer: Self-pay

## 2021-03-26 ENCOUNTER — Ambulatory Visit: Payer: Medicare Other | Admitting: Orthopaedic Surgery

## 2021-03-26 ENCOUNTER — Encounter: Payer: Self-pay | Admitting: Orthopaedic Surgery

## 2021-03-26 DIAGNOSIS — M5441 Lumbago with sciatica, right side: Secondary | ICD-10-CM | POA: Diagnosis not present

## 2021-03-26 DIAGNOSIS — G8929 Other chronic pain: Secondary | ICD-10-CM | POA: Diagnosis not present

## 2021-03-26 NOTE — Addendum Note (Signed)
Addended by: Elizabeth Sauer on: 03/26/2021 02:54 PM ? ? Modules accepted: Orders ? ?

## 2021-03-26 NOTE — Progress Notes (Signed)
My back is a little worse. ? ?She had an episode of lower back pain last week that lasted about three days.  She is better today.  Most of the pain is on the right side and right sided paresthesias but she has gotten some left side paresthesias at times.  She has no new trauma.  She is taking her medicine.  Today she feels the best she has in a while. ? ?MRI was done of the lumbar spine showing: ?IMPRESSION: ?1. Multilevel lumbar spondylosis as described above. New left ?subarticular disc extrusion and progressive now severe left lateral ?recess stenosis at L3-L4 which could affect the descending left L4 ?nerve root. ?2. Unchanged moderate bilateral lateral recess stenosis at L4-L5 and ?moderate right neuroforaminal stenosis at L5-S1. ? ?I have explained the findings to her.  I would like her to be seen by neurosurgery for further evaluation.  She is agreeable to this. ? ?I have independently reviewed the MRI.   ? ?Spine/Pelvis examination: ? Inspection:  Overall, sacoiliac joint benign and hips nontender; without crepitus or defects. ? ? Thoracic spine inspection: Alignment normal without kyphosis present ? ? Lumbar spine inspection:  Alignment  with normal lumbar lordosis, without scoliosis apparent. ? ? Thoracic spine palpation:  without tenderness of spinal processes ? ? Lumbar spine palpation: without tenderness of lumbar area; without tightness of lumbar muscles  ? ? Range of Motion: ?  Lumbar flexion, forward flexion is normal without pain or tenderness  ?  Lumbar extension is full without pain or tenderness ?  Left lateral bend is normal without pain or tenderness ?  Right lateral bend is normal without pain or tenderness ?  Straight leg raising is normal ? Strength & tone: normal ? ? Stability overall normal stability ? ?Encounter Diagnosis  ?Name Primary?  ? Chronic right-sided low back pain with right-sided sciatica Yes  ? ?Continue present medicine. ? ?See neurosurgery. ? ?Return in six weeks. ? ?Call if  any problem. ? ?Precautions discussed. ? ?Electronically Signed ?Sanjuana Kava, MD ?3/9/202310:01 AM ? ?

## 2021-04-03 DIAGNOSIS — M47816 Spondylosis without myelopathy or radiculopathy, lumbar region: Secondary | ICD-10-CM | POA: Diagnosis not present

## 2021-04-06 ENCOUNTER — Telehealth: Payer: Self-pay

## 2021-04-06 NOTE — Telephone Encounter (Signed)
Patient called stating that she saw Dr. Zada Finders and he told her that he didn't do the back injections.  ?Stated also, that he told her that it was her hip not her back that was her problem. She stated that if it's  ?her hip than she wanted to come back to see you for the injection. ? ?Please advise ?

## 2021-04-09 ENCOUNTER — Encounter: Payer: Self-pay | Admitting: Orthopaedic Surgery

## 2021-04-09 ENCOUNTER — Ambulatory Visit: Payer: Medicare Other | Admitting: Orthopaedic Surgery

## 2021-04-09 ENCOUNTER — Other Ambulatory Visit: Payer: Self-pay

## 2021-04-09 ENCOUNTER — Ambulatory Visit (INDEPENDENT_AMBULATORY_CARE_PROVIDER_SITE_OTHER): Payer: Medicare Other

## 2021-04-09 VITALS — BP 159/82 | HR 93 | Ht 65.0 in | Wt 224.0 lb

## 2021-04-09 DIAGNOSIS — M25552 Pain in left hip: Secondary | ICD-10-CM

## 2021-04-09 DIAGNOSIS — G8929 Other chronic pain: Secondary | ICD-10-CM

## 2021-04-09 DIAGNOSIS — M25562 Pain in left knee: Secondary | ICD-10-CM | POA: Diagnosis not present

## 2021-04-09 DIAGNOSIS — M5441 Lumbago with sciatica, right side: Secondary | ICD-10-CM | POA: Diagnosis not present

## 2021-04-09 NOTE — Progress Notes (Signed)
I just hurt ? ?She has seen the neurosurgeon, Dr. Zada Finders, about her lower back pain.  He felt it was not an operative case but recommended consideration of epidural.  She declined the epidural.  He felt she had left hip pain as well and she is here for that today.  I have read Dr. Colleen Can notes. ? ?Her left hip is tender, ROM is slightly decreased, 90 flexion, 25 abduction, 25 adduction, internal 20, external 20, extension 5.  Gait is good.  NV intact. ? ?Her left knee is tender with effusion, crepitus and ROM 0 to 105, pain medially, good gait, NV intact. ? ?Encounter Diagnoses  ?Name Primary?  ? Chronic hip pain, left Yes  ? Chronic pain of left knee   ? Chronic right-sided low back pain with right-sided sciatica   ? ?X-rays were done of the left hip, reported separately. ? ?Her left knee is more tender.  I will inject. ? ?PROCEDURE NOTE: ? ?The patient requests injections of the left knee , verbal consent was obtained. ? ?The left knee was prepped appropriately after time out was performed.  ? ?Sterile technique was observed and injection of 1 cc of DepoMedrol 40 mg with several cc's of plain xylocaine. Anesthesia was provided by ethyl chloride and a 20-gauge needle was used to inject the knee area. The injection was tolerated well.  A band aid dressing was applied. ? ?The patient was advised to apply ice later today and tomorrow to the injection sight as needed. ? ?I have asked her to increase her Advil to 600 mgm after each meal.  Stop if it bothers her stomach. ? ?I will see her in two weeks.  She may need to reconsider epidural if pain stays persistent. ? ?Call if any problem. ? ?Precautions discussed. ? ?Electronically Signed ?Sanjuana Kava, MD ?3/23/202310:06 AM ? ?

## 2021-04-22 ENCOUNTER — Other Ambulatory Visit: Payer: Self-pay | Admitting: Family Medicine

## 2021-04-23 ENCOUNTER — Ambulatory Visit: Payer: Medicare Other | Admitting: Orthopaedic Surgery

## 2021-05-07 ENCOUNTER — Encounter: Payer: Self-pay | Admitting: Family Medicine

## 2021-05-07 ENCOUNTER — Ambulatory Visit (INDEPENDENT_AMBULATORY_CARE_PROVIDER_SITE_OTHER): Payer: Medicare Other | Admitting: Family Medicine

## 2021-05-07 VITALS — BP 201/112 | HR 86 | Wt 227.0 lb

## 2021-05-07 DIAGNOSIS — I10 Essential (primary) hypertension: Secondary | ICD-10-CM

## 2021-05-07 DIAGNOSIS — M5136 Other intervertebral disc degeneration, lumbar region: Secondary | ICD-10-CM

## 2021-05-07 DIAGNOSIS — Z23 Encounter for immunization: Secondary | ICD-10-CM

## 2021-05-07 NOTE — Patient Instructions (Signed)
Monitor blood pressure closely over the next few weeks and call us if still running high. ?

## 2021-05-07 NOTE — Progress Notes (Signed)
? ?BP (!) 193/98   Pulse 86   Wt 227 lb (103 kg)   SpO2 96%   BMI 37.77 kg/m?   ? ?Subjective:  ? ?Patient ID: Anna Bradford, female    DOB: July 10, 1933, 88 y.o.   MRN: [id number] ? ?HPI: ?Anna Bradford is a 88 y.o. female presenting on 05/07/2021 for Medical Management of Chronic Issues, Hypertension, and Knee Pain (left) ? ? ?HPI ?Hypertension ?Patient is currently on amlodipine and losartan hydrochlorothiazide, she thinks she forgot to take them this morning, and their blood pressure today is 193/98. Patient denies any lightheadedness or dizziness. Patient denies headaches, blurred vision, chest pains, shortness of breath, or weakness. Denies any side effects from medication and is content with current medication.  ? ?Patient had an MRI that showed degenerative disc disease and possible nerve impingement but then she saw an orthopedic and had a knee injection and all of her pain went away for couple weeks, now it started to come back but she does have follow-up with orthopedic. ? ?Relevant past medical, surgical, family and social history reviewed and updated as indicated. Interim medical history since our last visit reviewed. ?Allergies and medications reviewed and updated. ? ?Review of Systems  ?Constitutional:  Negative for chills and fever.  ?Eyes:  Negative for visual disturbance.  ?Respiratory:  Negative for chest tightness and shortness of breath.   ?Cardiovascular:  Negative for chest pain and leg swelling.  ?Musculoskeletal:  Positive for arthralgias, back pain, joint swelling and myalgias. Negative for gait problem.  ?Skin:  Negative for color change and rash.  ?Neurological:  Negative for dizziness, light-headedness and headaches.  ?Psychiatric/Behavioral:  Negative for agitation and behavioral problems.   ?All other systems reviewed and are negative. ? ?Per HPI unless specifically indicated above ? ? ?Allergies as of 05/07/2021   ? ?   Reactions  ? Levofloxacin   ? Other reaction(s): Other (See  Comments) ?tendonitis  ? Nitrofurantoin   ? Other reaction(s): Other (See Comments) ?Bleeding thru bowels ?Bleeding thru bowels  ? Macrolides And Ketolides   ? Bleeding thru bowels  ? Codeine Nausea And Vomiting  ? Sulfa Antibiotics Nausea And Vomiting  ? ?  ? ?  ?Medication List  ?  ? ?  ? Accurate as of May 07, 2021 11:19 AM. If you have any questions, ask your nurse or doctor.  ?  ?  ? ?  ? ?albuterol 108 (90 Base) MCG/ACT inhaler ?Commonly known as: VENTOLIN HFA ?Inhale 2 puffs into the lungs every 6 (six) hours as needed for wheezing or shortness of breath. ?  ?amitriptyline 50 MG tablet ?Commonly known as: ELAVIL ?Take 1 tablet (50 mg total) by mouth at bedtime. ?  ?amLODipine 10 MG tablet ?Commonly known as: NORVASC ?TAKE 1 TABLET BY MOUTH DAILY ?  ?aspirin 81 MG tablet ?Take 81 mg by mouth daily. ?  ?baclofen 10 MG tablet ?Commonly known as: LIORESAL ?Take 1 tablet (10 mg total) by mouth at bedtime as needed for muscle spasms. ?  ?CALCIUM-MAGNESIUM-ZINC-D3 PO ?Take 1 tablet by mouth daily. ?  ?clobetasol cream 0.05 % ?Commonly known as: TEMOVATE ?Apply topically 2 (two) times daily. ?  ?diphenhydrAMINE 25 MG tablet ?Commonly known as: BENADRYL ?Take 25 mg by mouth daily as needed (allergies). ?  ?famotidine 20 MG tablet ?Commonly known as: Pepcid ?Take 1 tablet (20 mg total) by mouth 2 (two) times daily. ?  ?HYDROcodone-acetaminophen 5-325 MG tablet ?Commonly known as: Norco ?Take 1-2 tablets by mouth  every 6 (six) hours as needed. ?  ?ibuprofen 200 MG tablet ?Commonly known as: ADVIL ?Take 800 mg by mouth 2 (two) times daily. ?  ?ipratropium 0.06 % nasal spray ?Commonly known as: ATROVENT ?Place 2 sprays into both nostrils 3 (three) times daily. ?  ?lidocaine 2 % solution ?Commonly known as: XYLOCAINE ?SMARTSIG:10 Milliliter(s) By Mouth Every 3 Hours PRN ?  ?losartan-hydrochlorothiazide 100-25 MG tablet ?Commonly known as: HYZAAR ?Take 1 tablet by mouth daily. ?  ?magnesium hydroxide 400 MG/5ML  suspension ?Commonly known as: MILK OF MAGNESIA ?Take by mouth as needed for mild constipation. ?  ?multivitamin tablet ?Take 1 tablet by mouth daily. ?  ?nystatin cream ?Commonly known as: MYCOSTATIN ?Apply 1 application topically daily. ?  ?Potassium 99 MG Tabs ?Take 1 tablet by mouth daily. ?  ?rOPINIRole 2 MG tablet ?Commonly known as: REQUIP ?Take 1 tablet (2 mg total) by mouth at bedtime. For leg cramps ?  ?SYSTANE OP ?Place 1 drop into both eyes daily as needed (dry eyes). ?  ?triamcinolone cream 0.1 % ?Commonly known as: KENALOG ?Apply 1 application topically 2 (two) times daily. ?  ?vitamin C 1000 MG tablet ?Take 1,000 mg by mouth daily. ?  ?Vitamin D-3 25 MCG (1000 UT) Caps ?Take 2,000 Units by mouth daily. ?  ?zinc gluconate 50 MG tablet ?Take 50 mg by mouth daily. ?  ? ?  ? ? ? ?Objective:  ? ?BP (!) 193/98   Pulse 86   Wt 227 lb (103 kg)   SpO2 96%   BMI 37.77 kg/m?   ?Wt Readings from Last 3 Encounters:  ?05/07/21 227 lb (103 kg)  ?04/09/21 224 lb (101.6 kg)  ?03/05/21 226 lb (102.5 kg)  ?  ?Physical Exam ?Vitals and nursing note reviewed.  ?Constitutional:   ?   General: She is not in acute distress. ?   Appearance: She is well-developed. She is not diaphoretic.  ?Eyes:  ?   Conjunctiva/sclera: Conjunctivae normal.  ?Cardiovascular:  ?   Rate and Rhythm: Normal rate and regular rhythm.  ?   Heart sounds: Normal heart sounds. No murmur heard. ?Pulmonary:  ?   Effort: Pulmonary effort is normal. No respiratory distress.  ?   Breath sounds: Normal breath sounds. No wheezing.  ?Musculoskeletal:     ?   General: Normal range of motion.  ?   Left knee: Effusion and crepitus present. No swelling or erythema. Normal range of motion. Tenderness present over the medial joint line and lateral joint line. No LCL laxity, MCL laxity, ACL laxity or PCL laxity. ?Skin: ?   General: Skin is warm and dry.  ?   Findings: No rash.  ?Neurological:  ?   Mental Status: She is alert and oriented to person, place, and  time.  ?   Coordination: Coordination normal.  ?Psychiatric:     ?   Behavior: Behavior normal.  ? ? ? ? ?Assessment & Plan:  ? ?Problem List Items Addressed This Visit   ? ?  ? Cardiovascular and Mediastinum  ? Essential hypertension, benign  ?  ? Other  ? Severe obesity (BMI 35.0-39.9) with comorbidity ([place])  ? ?Other Visit Diagnoses   ? ? Degenerative disc disease, lumbar    -  Primary  ? Need for shingles vaccine      ? Relevant Orders  ? Varicella-zoster vaccine IM (Shingrix) (Completed)  ? ?  ?  ?Patient saw neurosurgeon and they thought it was mostly from her knee and had  a knee injection was a lot better with orthopedic.  She is going back to the orthopedic next week to reevaluate. ? ?Blood pressure still elevated, patient says she did not take her blood pressure pills this morning ?Follow up plan: ?Return in about 3 months (around 08/06/2021), or if symptoms worsen or fail to improve, for Hypertension recheck. ? ?Counseling provided for all of the vaccine components ?Orders Placed This Encounter  ?Procedures  ? Varicella-zoster vaccine IM (Shingrix)  ? ? ?Caryl Pina, MD ?[place] ?05/07/2021, 11:19 AM ? ? ? ? ?

## 2021-05-12 ENCOUNTER — Ambulatory Visit: Payer: Medicare Other | Admitting: Orthopaedic Surgery

## 2021-05-12 ENCOUNTER — Encounter: Payer: Self-pay | Admitting: Orthopaedic Surgery

## 2021-05-12 VITALS — BP 179/83 | HR 86 | Ht 65.0 in | Wt 227.0 lb

## 2021-05-12 DIAGNOSIS — M7062 Trochanteric bursitis, left hip: Secondary | ICD-10-CM | POA: Diagnosis not present

## 2021-05-12 DIAGNOSIS — M25562 Pain in left knee: Secondary | ICD-10-CM | POA: Diagnosis not present

## 2021-05-12 DIAGNOSIS — G8929 Other chronic pain: Secondary | ICD-10-CM | POA: Diagnosis not present

## 2021-05-12 NOTE — Progress Notes (Signed)
PROCEDURE NOTE: ? ?The patient requests injections of the left knee , verbal consent was obtained. ? ?The left knee was prepped appropriately after time out was performed.  ? ?Sterile technique was observed and injection of 1 cc of DepoMedrol 40 mg with several cc's of plain xylocaine. Anesthesia was provided by ethyl chloride and a 20-gauge needle was used to inject the knee area. The injection was tolerated well.  A band aid dressing was applied. ? ?The patient was advised to apply ice later today and tomorrow to the injection sight as needed. ? ?PROCEDURE NOTE: ? ?The patient request injection, verbal consent was obtained. ? ?The left trochanteric area of the hip was prepped appropriately after time out was performed.  ? ?Sterile technique was observed and injection of 1 cc of DepoMedrol 40 mg with several cc's of plain xylocaine. Anesthesia was provided by ethyl chloride and a 20-gauge needle was used to inject the hip area. The injection was tolerated well. ? ?A band aid dressing was applied. ? ?The patient was advised to apply ice later today and tomorrow to the injection sight as needed. ? ?Encounter Diagnoses  ?Name Primary?  ? Chronic pain of left knee Yes  ? Trochanteric bursitis, left hip   ? ?Return in six weeks. ? ?Call if any problem. ? ?Precautions discussed. ? ?Electronically Signed ?Sanjuana Kava, MD ?4/25/20239:15 AM ? ?

## 2021-05-14 ENCOUNTER — Telehealth: Payer: Self-pay | Admitting: Orthopaedic Surgery

## 2021-05-14 NOTE — Telephone Encounter (Signed)
Patient called at 12:10 pm and left message stating her shot has run out and she wants someone to call her back  ? ? ?

## 2021-05-14 NOTE — Telephone Encounter (Signed)
Spoke with patient. She stated that the pain has come back. She would like to be referred to Dr. Augustin Coupe to discuss surgery. Patient aware the provider is out of the office until Tuesday.  ?

## 2021-05-19 ENCOUNTER — Telehealth: Payer: Self-pay | Admitting: Radiology

## 2021-05-19 DIAGNOSIS — G8929 Other chronic pain: Secondary | ICD-10-CM

## 2021-05-19 NOTE — Telephone Encounter (Signed)
Patient called Anna Bradford and said that if she has to have surgery she wants it done by Dr Ronnie Derby ph [phone]. ?

## 2021-05-20 ENCOUNTER — Telehealth: Payer: Self-pay | Admitting: Family Medicine

## 2021-05-20 NOTE — Telephone Encounter (Signed)
REFERRAL REQUEST ?Telephone Note ? ?Have you been seen at our office for this problem? yes ?(Advise that they may need an appointment with their PCP before a referral can be done) ? ?Reason for Referral: orthopedic knee pain ?Referral discussed with patient: yes 05/07/2021  ?Best contact number of patient for referral team: [phone]    ?Has patient been seen by a specialist for this issue before: no  ?Patient provider preference for referral: DR Leona Carry [place] [phone] ?Patient location preference for referral:  ?Leona Carry [place] [phone] ?  ?Patient notified that referrals can take up to a week or longer to process. If they haven't heard anything within a week they should call back and speak with the referral department.  ? ?

## 2021-06-04 DIAGNOSIS — M5416 Radiculopathy, lumbar region: Secondary | ICD-10-CM | POA: Diagnosis not present

## 2021-06-04 DIAGNOSIS — M170 Bilateral primary osteoarthritis of knee: Secondary | ICD-10-CM | POA: Diagnosis not present

## 2021-06-05 ENCOUNTER — Telehealth: Payer: Self-pay

## 2021-06-05 NOTE — Telephone Encounter (Signed)
Per [place] pt needs a surgical clearance appt. Please schedule a 30 minute appt for pt when she returns call

## 2021-06-05 NOTE — Telephone Encounter (Signed)
Pt dropped off surgical clearance form today.  Pt has been left a message to call back to clarify what procedure she is having. She may require an EKG prior to the procedure.  Form at [employer].

## 2021-06-05 NOTE — Telephone Encounter (Signed)
error 

## 2021-06-08 NOTE — Telephone Encounter (Signed)
Appt made fir 06/18/21

## 2021-06-18 ENCOUNTER — Ambulatory Visit (INDEPENDENT_AMBULATORY_CARE_PROVIDER_SITE_OTHER): Payer: Medicare Other | Admitting: Family Medicine

## 2021-06-18 ENCOUNTER — Encounter: Payer: Self-pay | Admitting: Family Medicine

## 2021-06-18 VITALS — BP 157/81 | HR 91 | Temp 98.0°F | Ht 65.0 in | Wt 224.0 lb

## 2021-06-18 DIAGNOSIS — Z01818 Encounter for other preprocedural examination: Secondary | ICD-10-CM | POA: Diagnosis not present

## 2021-06-18 NOTE — Progress Notes (Signed)
BP (!) 157/81   Pulse 91   Temp 98 F (36.7 C)   Ht _0  (1.651 m)   Wt 224 lb (101.6 kg)   SpO2 96%   BMI 37.28 kg/m    Subjective:   Patient ID: Anna Bradford, female    DOB: Nov 03, 1933, 88 y.o.   MRN: [id number]  HPI: Anna Bradford is a 88 y.o. female presenting on 06/18/2021 for Surgical Clearance   HPI Left knee pain preoperative clearance Patient is coming in for left knee pain and preoperative clearance today.  She is planning to have surgery with Dr. Ronnie Derby and she has been having a lot of pain in the left knee and is ready to have her surgery.  She has some home blood pressures are running in the 120s to 130s for the most part, occasionally she runs high.  She is high here in the office today because of the pain that took for her to get in here they raise her blood pressure up.  She denies any chest pain or shortness of breath with ambulation.  Relevant past medical, surgical, family and social history reviewed and updated as indicated. Interim medical history since our last visit reviewed. Allergies and medications reviewed and updated.  Review of Systems  Constitutional:  Negative for chills and fever.  Eyes:  Negative for visual disturbance.  Respiratory:  Negative for chest tightness and shortness of breath.   Cardiovascular:  Negative for chest pain, palpitations and leg swelling.  Musculoskeletal:  Negative for back pain and gait problem.  Skin:  Negative for rash.  Neurological:  Negative for dizziness, light-headedness and headaches.  Psychiatric/Behavioral:  Negative for agitation and behavioral problems.   All other systems reviewed and are negative.  Per HPI unless specifically indicated above   Allergies as of 06/18/2021       Reactions   Levofloxacin    Other reaction(s): Other (See Comments) tendonitis   Nitrofurantoin    Other reaction(s): Other (See Comments) Bleeding thru bowels Bleeding thru bowels   Macrolides And Ketolides    Bleeding thru  bowels   Codeine Nausea And Vomiting   Sulfa Antibiotics Nausea And Vomiting        Medication List        Accurate as of June 18, 2021  4:49 PM. If you have any questions, ask your nurse or doctor.          albuterol 108 (90 Base) MCG/ACT inhaler Commonly known as: VENTOLIN HFA Inhale 2 puffs into the lungs every 6 (six) hours as needed for wheezing or shortness of breath.   amitriptyline 50 MG tablet Commonly known as: ELAVIL Take 1 tablet (50 mg total) by mouth at bedtime.   amLODipine 10 MG tablet Commonly known as: NORVASC TAKE 1 TABLET BY MOUTH DAILY   aspirin 81 MG tablet Take 81 mg by mouth daily.   baclofen 10 MG tablet Commonly known as: LIORESAL Take 1 tablet (10 mg total) by mouth at bedtime as needed for muscle spasms.   CALCIUM-MAGNESIUM-ZINC-D3 PO Take 1 tablet by mouth daily.   clobetasol cream 0.05 % Commonly known as: TEMOVATE Apply topically 2 (two) times daily.   diphenhydrAMINE 25 MG tablet Commonly known as: BENADRYL Take 25 mg by mouth daily as needed (allergies).   famotidine 20 MG tablet Commonly known as: Pepcid Take 1 tablet (20 mg total) by mouth 2 (two) times daily.   HYDROcodone-acetaminophen 5-325 MG tablet Commonly known as: Norco Take 1-2  tablets by mouth every 6 (six) hours as needed.   ibuprofen 200 MG tablet Commonly known as: ADVIL Take 800 mg by mouth 2 (two) times daily.   ipratropium 0.06 % nasal spray Commonly known as: ATROVENT Place 2 sprays into both nostrils 3 (three) times daily.   lidocaine 2 % solution Commonly known as: XYLOCAINE SMARTSIG:10 Milliliter(s) By Mouth Every 3 Hours PRN   losartan-hydrochlorothiazide 100-25 MG tablet Commonly known as: HYZAAR Take 1 tablet by mouth daily.   magnesium hydroxide 400 MG/5ML suspension Commonly known as: MILK OF MAGNESIA Take by mouth as needed for mild constipation.   multivitamin tablet Take 1 tablet by mouth daily.   nystatin cream Commonly known  as: MYCOSTATIN Apply 1 application topically daily.   Potassium 99 MG Tabs Take 1 tablet by mouth daily.   rOPINIRole 2 MG tablet Commonly known as: REQUIP Take 1 tablet (2 mg total) by mouth at bedtime. For leg cramps   SYSTANE OP Place 1 drop into both eyes daily as needed (dry eyes).   triamcinolone cream 0.1 % Commonly known as: KENALOG Apply 1 application topically 2 (two) times daily.   vitamin C 1000 MG tablet Take 1,000 mg by mouth daily.   Vitamin D-3 25 MCG (1000 UT) Caps Take 2,000 Units by mouth daily.   zinc gluconate 50 MG tablet Take 50 mg by mouth daily.         Objective:   BP (!) 157/81   Pulse 91   Temp 98 F (36.7 C)   Ht _0  (1.651 m)   Wt 224 lb (101.6 kg)   SpO2 96%   BMI 37.28 kg/m   Wt Readings from Last 3 Encounters:  06/18/21 224 lb (101.6 kg)  05/12/21 227 lb (103 kg)  05/07/21 227 lb (103 kg)    Physical Exam Vitals and nursing note reviewed.  Constitutional:      General: She is not in acute distress.    Appearance: She is well-developed. She is not diaphoretic.  Eyes:     Conjunctiva/sclera: Conjunctivae normal.  Cardiovascular:     Rate and Rhythm: Normal rate and regular rhythm.     Heart sounds: Normal heart sounds. No murmur heard. Pulmonary:     Effort: Pulmonary effort is normal. No respiratory distress.     Breath sounds: Normal breath sounds. No wheezing.  Musculoskeletal:        General: No tenderness. Normal range of motion.  Skin:    General: Skin is warm and dry.     Findings: No rash.  Neurological:     Mental Status: She is alert and oriented to person, place, and time.     Coordination: Coordination normal.  Psychiatric:        Behavior: Behavior normal.    EKG sinus tachycardia, no abnormality.  No ST changes  Assessment & Plan:   Problem List Items Addressed This Visit   None Visit Diagnoses     Preoperative clearance    -  Primary   Relevant Orders   EKG 12-Lead (Completed)   CBC with  Differential/Platelet   CMP14+EGFR   CoaguChek XS/INR Waived       We will do blood work and as long as the blood work looks good then we will clear for surgery.  Meds looks good for her situation. Follow up plan: Return if symptoms worsen or fail to improve.  Counseling provided for all of the vaccine components Orders Placed This Encounter  Procedures  CBC with Differential/Platelet   CMP14+EGFR   CoaguChek XS/INR Waived   EKG 12-Lead    Caryl Pina, MD [facility] 06/18/2021, 4:49 PM

## 2021-06-19 LAB — CBC WITH DIFFERENTIAL/PLATELET
Basophils Absolute: 0.1 10*3/uL (ref 0.0–0.2)
Basos: 1 %
EOS (ABSOLUTE): 0.2 10*3/uL (ref 0.0–0.4)
Eos: 2 %
Hematocrit: 41.9 % (ref 34.0–46.6)
Hemoglobin: 14.2 g/dL (ref 11.1–15.9)
Immature Grans (Abs): 0 10*3/uL (ref 0.0–0.1)
Immature Granulocytes: 0 %
Lymphocytes Absolute: 2.6 10*3/uL (ref 0.7–3.1)
Lymphs: 22 %
MCH: 30.7 pg (ref 26.6–33.0)
MCHC: 33.9 g/dL (ref 31.5–35.7)
MCV: 91 fL (ref 79–97)
Monocytes Absolute: 1 10*3/uL — ABNORMAL HIGH (ref 0.1–0.9)
Monocytes: 8 %
Neutrophils Absolute: 7.8 10*3/uL — ABNORMAL HIGH (ref 1.4–7.0)
Neutrophils: 67 %
Platelets: 280 10*3/uL (ref 150–450)
RBC: 4.63 x10E6/uL (ref 3.77–5.28)
RDW: 13.5 % (ref 11.7–15.4)
WBC: 11.8 10*3/uL — ABNORMAL HIGH (ref 3.4–10.8)

## 2021-06-19 LAB — CMP14+EGFR
ALT: 19 IU/L (ref 0–32)
AST: 18 IU/L (ref 0–40)
Albumin/Globulin Ratio: 1.7 (ref 1.2–2.2)
Albumin: 3.8 g/dL (ref 3.6–4.6)
Alkaline Phosphatase: 86 IU/L (ref 44–121)
BUN/Creatinine Ratio: 20 (ref 12–28)
BUN: 17 mg/dL (ref 8–27)
Bilirubin Total: 0.4 mg/dL (ref 0.0–1.2)
CO2: 27 mmol/L (ref 20–29)
Calcium: 9.2 mg/dL (ref 8.7–10.3)
Chloride: 104 mmol/L (ref 96–106)
Creatinine, Ser: 0.86 mg/dL (ref 0.57–1.00)
Globulin, Total: 2.3 g/dL (ref 1.5–4.5)
Glucose: 96 mg/dL (ref 70–99)
Potassium: 4.3 mmol/L (ref 3.5–5.2)
Sodium: 143 mmol/L (ref 134–144)
Total Protein: 6.1 g/dL (ref 6.0–8.5)
eGFR: 66 mL/min/{1.73_m2} (ref >59)

## 2021-06-19 LAB — COAGUCHEK XS/INR WAIVED
INR: 1 (ref 0.9–1.1)
Prothrombin Time: 11.5 s

## 2021-06-22 ENCOUNTER — Telehealth: Payer: Self-pay | Admitting: Family Medicine

## 2021-06-22 ENCOUNTER — Telehealth: Payer: Self-pay

## 2021-06-22 NOTE — Telephone Encounter (Signed)
Spoke with pt. Confirmed that she will come in the morning to sign. Form will need to be faxed at that time.

## 2021-06-22 NOTE — Telephone Encounter (Signed)
Surgical clearance appt and form has been completed. Pt just needs to sign the form so we can fax to surgeon.  Left message for pt to come by the office and sign.

## 2021-06-22 NOTE — Telephone Encounter (Signed)
Patient calling because Dr. Ronnie Derby has not received paperwork for patient to be released for surgery. She was seen on 6/1 for surgical clearance. Please send.

## 2021-06-22 NOTE — Telephone Encounter (Signed)
Actually tried to call her earlier today but there was a part on the form that we did not see before that she actually needs to sign herself before we can send it.  I have signed my part and everything and it is ready to be sent except for there is 1 part that she has to sign, this form is different than the ones we are used to seeing so that is why we did not notice it at first

## 2021-06-29 ENCOUNTER — Encounter: Payer: Self-pay | Admitting: *Deleted

## 2021-06-30 ENCOUNTER — Ambulatory Visit: Payer: Medicare Other | Admitting: Orthopaedic Surgery

## 2021-07-14 ENCOUNTER — Telehealth: Payer: Self-pay | Admitting: Radiology

## 2021-07-14 ENCOUNTER — Telehealth: Payer: Self-pay | Admitting: Family Medicine

## 2021-07-14 NOTE — Telephone Encounter (Signed)
She can take Tylenol 1,000 mg Q8H PRN and Ibuprofen 400-600 mg Q6H PRN. I would not take Ibuprofen long term due to age and possible affect on the kidneys.

## 2021-07-15 ENCOUNTER — Encounter: Payer: Self-pay | Admitting: Family Medicine

## 2021-07-15 ENCOUNTER — Ambulatory Visit: Payer: Medicare Other | Admitting: Family Medicine

## 2021-07-15 ENCOUNTER — Ambulatory Visit (INDEPENDENT_AMBULATORY_CARE_PROVIDER_SITE_OTHER): Payer: Medicare Other | Admitting: Family Medicine

## 2021-07-15 ENCOUNTER — Other Ambulatory Visit: Payer: Self-pay

## 2021-07-15 ENCOUNTER — Telehealth: Payer: Self-pay | Admitting: Family Medicine

## 2021-07-15 VITALS — BP 168/93 | HR 91 | Temp 98.5°F | Ht 65.0 in

## 2021-07-15 DIAGNOSIS — M5136 Other intervertebral disc degeneration, lumbar region: Secondary | ICD-10-CM

## 2021-07-15 DIAGNOSIS — M792 Neuralgia and neuritis, unspecified: Secondary | ICD-10-CM

## 2021-07-15 DIAGNOSIS — M545 Low back pain, unspecified: Secondary | ICD-10-CM | POA: Diagnosis not present

## 2021-07-15 DIAGNOSIS — M5137 Other intervertebral disc degeneration, lumbosacral region: Secondary | ICD-10-CM

## 2021-07-15 MED ORDER — PREDNISONE 20 MG PO TABS: ORAL_TABLET | ORAL | 0 refills | Status: DC

## 2021-07-15 MED ORDER — METHYLPREDNISOLONE ACETATE 40 MG/ML IJ SUSP
40.0000 mg | Freq: Once | INTRAMUSCULAR | Status: AC
  Administered 2021-07-15: 80 mg via INTRAMUSCULAR

## 2021-07-15 MED ORDER — AMITRIPTYLINE HCL 50 MG PO TABS: 50.0000 mg | ORAL_TABLET | Freq: Every day | ORAL | 3 refills | Status: DC

## 2021-07-15 MED ORDER — METHYLPREDNISOLONE ACETATE 80 MG/ML IJ SUSP: 80.0000 mg | Freq: Once | INTRAMUSCULAR | Status: DC

## 2021-07-15 NOTE — Telephone Encounter (Signed)
New referral placed. Pt made aware.

## 2021-07-15 NOTE — Progress Notes (Signed)
BP (!) 168/93   Pulse 91   Temp 98.5 F (36.9 C)   Ht '5\' 5"'$  (1.651 m)   SpO2 98%   BMI 37.28 kg/m    Subjective:   Patient ID: Anna Bradford, female    DOB: [birth year], 88 y.o.   MRN: [id number]  HPI: Anna Bradford is a 88 y.o. female presenting on 07/15/2021 for Leg Pain (Left lateral thigh-started months ago. Denies swelling, redness, nodule- knee surgery postponed by Dr. Maureen Ralphs due to pain)   HPI Patient comes in complaining of left lateral thigh pain that comes around from her lower buttock area down the left lateral thigh and she says the pain has been much more severe than it had been previously just over the past week.  She was supposed to have a knee replacement surgery but that is been postponed because she is in such severe pain from this and concerns that it is from her back.  She did have an MRI 3 months ago that did showed degeneration and possible nerve root impingement and bulging disks in the lumbar spine.  She says the pain just recently worsened over the past week significantly.  She has not been used anything over-the-counter for this at this point.  Relevant past medical, surgical, family and social history reviewed and updated as indicated. Interim medical history since our last visit reviewed. Allergies and medications reviewed and updated.  Review of Systems  Constitutional:  Negative for chills and fever.  Eyes:  Negative for visual disturbance.  Respiratory:  Negative for chest tightness and shortness of breath.   Cardiovascular:  Negative for chest pain and leg swelling.  Musculoskeletal:  Positive for arthralgias, back pain, gait problem and myalgias.  Skin:  Negative for rash.  Neurological:  Negative for light-headedness, numbness and headaches.  Psychiatric/Behavioral:  Negative for agitation and behavioral problems.   All other systems reviewed and are negative.   Per HPI unless specifically indicated above   Allergies as of 07/15/2021        Reactions   Levofloxacin    Other reaction(s): Other (See Comments) tendonitis   Nitrofurantoin    Other reaction(s): Other (See Comments) Bleeding thru bowels Bleeding thru bowels   Macrolides And Ketolides    Bleeding thru bowels   Codeine Nausea And Vomiting   Sulfa Antibiotics Nausea And Vomiting        Medication List        Accurate as of July 15, 2021  9:34 AM. If you have any questions, ask your nurse or doctor.          STOP taking these medications    HYDROcodone-acetaminophen 5-325 MG tablet Commonly known as: Norco Stopped by: Worthy Rancher, MD       TAKE these medications    albuterol 108 (90 Base) MCG/ACT inhaler Commonly known as: VENTOLIN HFA Inhale 2 puffs into the lungs every 6 (six) hours as needed for wheezing or shortness of breath.   amitriptyline 50 MG tablet Commonly known as: ELAVIL Take 1 tablet (50 mg total) by mouth at bedtime.   amLODipine 10 MG tablet Commonly known as: NORVASC TAKE 1 TABLET BY MOUTH DAILY   aspirin 81 MG tablet Take 81 mg by mouth daily.   baclofen 10 MG tablet Commonly known as: LIORESAL Take 1 tablet (10 mg total) by mouth at bedtime as needed for muscle spasms.   CALCIUM-MAGNESIUM-ZINC-D3 PO Take 1 tablet by mouth daily.   clobetasol cream 0.05 %  Commonly known as: TEMOVATE Apply topically 2 (two) times daily.   diphenhydrAMINE 25 MG tablet Commonly known as: BENADRYL Take 25 mg by mouth daily as needed (allergies).   famotidine 20 MG tablet Commonly known as: Pepcid Take 1 tablet (20 mg total) by mouth 2 (two) times daily.   ibuprofen 200 MG tablet Commonly known as: ADVIL Take 800 mg by mouth 2 (two) times daily.   ipratropium 0.06 % nasal spray Commonly known as: ATROVENT Place 2 sprays into both nostrils 3 (three) times daily.   lidocaine 2 % solution Commonly known as: XYLOCAINE SMARTSIG:10 Milliliter(s) By Mouth Every 3 Hours PRN   losartan-hydrochlorothiazide 100-25 MG  tablet Commonly known as: HYZAAR Take 1 tablet by mouth daily.   magnesium hydroxide 400 MG/5ML suspension Commonly known as: MILK OF MAGNESIA Take by mouth as needed for mild constipation.   multivitamin tablet Take 1 tablet by mouth daily.   nystatin cream Commonly known as: MYCOSTATIN Apply 1 application topically daily.   Potassium 99 MG Tabs Take 1 tablet by mouth daily.   predniSONE 20 MG tablet Commonly known as: DELTASONE 2 po at same time daily for 5 days Started by: Fransisca Kaufmann Connie Lasater, MD   rOPINIRole 2 MG tablet Commonly known as: REQUIP Take 1 tablet (2 mg total) by mouth at bedtime. For leg cramps   SYSTANE OP Place 1 drop into both eyes daily as needed (dry eyes).   triamcinolone cream 0.1 % Commonly known as: KENALOG Apply 1 application topically 2 (two) times daily.   vitamin C 1000 MG tablet Take 1,000 mg by mouth daily.   Vitamin D-3 25 MCG (1000 UT) Caps Take 2,000 Units by mouth daily.   zinc gluconate 50 MG tablet Take 50 mg by mouth daily.         Objective:   BP (!) 168/93   Pulse 91   Temp 98.5 F (36.9 C)   Ht '5\' 5"'$  (1.651 m)   SpO2 98%   BMI 37.28 kg/m   Wt Readings from Last 3 Encounters:  06/18/21 224 lb (101.6 kg)  05/12/21 227 lb (103 kg)  05/07/21 227 lb (103 kg)    Physical Exam Vitals and nursing note reviewed.  Constitutional:      General: She is not in acute distress.    Appearance: She is well-developed. She is not diaphoretic.  Eyes:     Conjunctiva/sclera: Conjunctivae normal.  Musculoskeletal:     Lumbar back: Tenderness present. No bony tenderness. Positive left straight leg raise test.  Skin:    General: Skin is warm and dry.     Findings: No rash.  Neurological:     Mental Status: She is alert and oriented to person, place, and time.     Coordination: Coordination normal.  Psychiatric:        Behavior: Behavior normal.       Assessment & Plan:   Problem List Items Addressed This Visit    None Visit Diagnoses     Low back pain, unspecified back pain laterality, unspecified chronicity, unspecified whether sciatica present    -  Primary   Relevant Medications   amitriptyline (ELAVIL) 50 MG tablet   methylPREDNISolone acetate (DEPO-MEDROL) injection 80 mg   predniSONE (DELTASONE) 20 MG tablet   Other Relevant Orders   Ambulatory referral to Neurosurgery   Degenerative disc disease at L5-S1 level       Relevant Medications   amitriptyline (ELAVIL) 50 MG tablet   methylPREDNISolone acetate (DEPO-MEDROL)  injection 80 mg   predniSONE (DELTASONE) 20 MG tablet   Other Relevant Orders   Ambulatory referral to Neurosurgery       We will give short course steroids and a steroid injection today to see if we can calm the nerves down but with recent MRI does show areas of possible nerve root impingement. Follow up plan: Return if symptoms worsen or fail to improve.  Counseling provided for all of the vaccine components Orders Placed This Encounter  Procedures   Ambulatory referral to Neurosurgery    Caryl Pina, MD [facility] 07/15/2021, 9:34 AM

## 2021-07-15 NOTE — Telephone Encounter (Signed)
Patient was told to let us know which doctor she would like to see for her back. She is calling to let [place] know that she prefers to see Kristeen Miss at [facility] if possible. Please call back and let her know.

## 2021-07-15 NOTE — Addendum Note (Signed)
Addended by: Alphonzo Dublin on: 07/15/2021 10:44 AM   Modules accepted: Orders

## 2021-07-15 NOTE — Telephone Encounter (Signed)
Referral has already been placed, go ahead and make sure he gets sent to Dr. Ellene Route

## 2021-07-17 ENCOUNTER — Other Ambulatory Visit: Payer: Self-pay | Admitting: Family Medicine

## 2021-07-17 ENCOUNTER — Telehealth: Payer: Self-pay | Admitting: Family Medicine

## 2021-07-17 DIAGNOSIS — I10 Essential (primary) hypertension: Secondary | ICD-10-CM

## 2021-07-18 ENCOUNTER — Other Ambulatory Visit: Payer: Self-pay | Admitting: Family Medicine

## 2021-07-18 DIAGNOSIS — M51379 Other intervertebral disc degeneration, lumbosacral region without mention of lumbar back pain or lower extremity pain: Secondary | ICD-10-CM

## 2021-07-18 DIAGNOSIS — M5137 Other intervertebral disc degeneration, lumbosacral region: Secondary | ICD-10-CM

## 2021-07-18 DIAGNOSIS — M545 Low back pain, unspecified: Secondary | ICD-10-CM

## 2021-07-22 ENCOUNTER — Encounter: Payer: Self-pay | Admitting: Family Medicine

## 2021-07-22 ENCOUNTER — Ambulatory Visit (HOSPITAL_COMMUNITY)
Admission: RE | Admit: 2021-07-22 | Discharge: 2021-07-22 | Disposition: A | Discharge status: Home / Self Care | Payer: Medicare Other | Source: Ambulatory Visit | Attending: Family Medicine | Admitting: Family Medicine

## 2021-07-22 ENCOUNTER — Ambulatory Visit (INDEPENDENT_AMBULATORY_CARE_PROVIDER_SITE_OTHER): Payer: Medicare Other | Admitting: Family Medicine

## 2021-07-22 VITALS — BP 162/82 | HR 87 | Temp 97.8°F | Ht 65.0 in | Wt 223.0 lb

## 2021-07-22 DIAGNOSIS — R1031 Right lower quadrant pain: Secondary | ICD-10-CM

## 2021-07-22 DIAGNOSIS — I700 Atherosclerosis of aorta: Secondary | ICD-10-CM | POA: Diagnosis not present

## 2021-07-22 MED ORDER — IOHEXOL 300 MG/ML  SOLN
100.0000 mL | Freq: Once | INTRAMUSCULAR | Status: AC | PRN
  Administered 2021-07-22: 100 mL via INTRAVENOUS

## 2021-07-22 NOTE — Progress Notes (Signed)
BP (!) 176/89   Pulse 87   Temp 97.8 F (36.6 C)   Ht '5\' 5"'$  (1.651 m)   Wt 223 lb (101.2 kg)   SpO2 95%   BMI 37.11 kg/m    Subjective:   Patient ID: Anna Bradford, female    DOB: [birth year], 88 y.o.   MRN: [id number]  HPI: Anna Bradford is a 88 y.o. female presenting on 07/22/2021 for Abdominal Pain (RLQ- ? Abdominal hernia)   HPI Right lower quadrant abdominal pain Patient is coming in today complaining of right lower quadrant abdominal pain this been going on over the past few months but has worsened recently.  She says its always usually in the right lower quadrant and hurts worse when she is trying to get up and move around and sometimes she will feel like something is bulging out there.  She denies any fevers or chills or diarrhea or constipation or blood in her stool.  Patient denies any nausea or vomiting.  She has been eating just fine.  She does have a lot of left hip pain as well  Relevant past medical, surgical, family and social history reviewed and updated as indicated. Interim medical history since our last visit reviewed. Allergies and medications reviewed and updated.  Review of Systems  Constitutional:  Negative for chills and fever.  Eyes:  Negative for visual disturbance.  Respiratory:  Negative for chest tightness and shortness of breath.   Cardiovascular:  Negative for chest pain and leg swelling.  Gastrointestinal:  Positive for abdominal pain. Negative for blood in stool, constipation, diarrhea, nausea and vomiting.  Musculoskeletal:  Negative for back pain and gait problem.  Skin:  Negative for rash.  Neurological:  Negative for light-headedness and headaches.  Psychiatric/Behavioral:  Negative for agitation and behavioral problems.   All other systems reviewed and are negative.   Per HPI unless specifically indicated above   Allergies as of 07/22/2021       Reactions   Levofloxacin    Other reaction(s): Other (See Comments) tendonitis    Nitrofurantoin    Other reaction(s): Other (See Comments) Bleeding thru bowels Bleeding thru bowels   Macrolides And Ketolides    Bleeding thru bowels   Codeine Nausea And Vomiting   Sulfa Antibiotics Nausea And Vomiting        Medication List        Accurate as of July 22, 2021  8:24 AM. If you have any questions, ask your nurse or doctor.          acetaminophen 650 MG CR tablet Commonly known as: TYLENOL Take 650 mg by mouth every 8 (eight) hours as needed for pain.   albuterol 108 (90 Base) MCG/ACT inhaler Commonly known as: VENTOLIN HFA Inhale 2 puffs into the lungs every 6 (six) hours as needed for wheezing or shortness of breath.   amitriptyline 50 MG tablet Commonly known as: ELAVIL Take 1 tablet (50 mg total) by mouth at bedtime.   amLODipine 10 MG tablet Commonly known as: NORVASC TAKE 1 TABLET BY MOUTH DAILY   aspirin 81 MG tablet Take 81 mg by mouth daily.   baclofen 10 MG tablet Commonly known as: LIORESAL Take 1 tablet (10 mg total) by mouth at bedtime as needed for muscle spasms.   CALCIUM-MAGNESIUM-ZINC-D3 PO Take 1 tablet by mouth daily.   clobetasol cream 0.05 % Commonly known as: TEMOVATE Apply topically 2 (two) times daily.   diphenhydrAMINE 25 MG tablet Commonly known as: BENADRYL  Take 25 mg by mouth daily as needed (allergies).   famotidine 20 MG tablet Commonly known as: Pepcid Take 1 tablet (20 mg total) by mouth 2 (two) times daily.   ibuprofen 200 MG tablet Commonly known as: ADVIL Take 800 mg by mouth 2 (two) times daily.   ipratropium 0.06 % nasal spray Commonly known as: ATROVENT Place 2 sprays into both nostrils 3 (three) times daily.   lidocaine 2 % solution Commonly known as: XYLOCAINE SMARTSIG:10 Milliliter(s) By Mouth Every 3 Hours PRN   losartan-hydrochlorothiazide 100-25 MG tablet Commonly known as: HYZAAR TAKE 1 TABLET BY MOUTH  DAILY   magnesium hydroxide 400 MG/5ML suspension Commonly known as: MILK OF  MAGNESIA Take by mouth as needed for mild constipation.   multivitamin tablet Take 1 tablet by mouth daily.   nystatin cream Commonly known as: MYCOSTATIN Apply 1 application topically daily.   Potassium 99 MG Tabs Take 1 tablet by mouth daily.   predniSONE 20 MG tablet Commonly known as: DELTASONE 2 po at same time daily for 5 days   rOPINIRole 2 MG tablet Commonly known as: REQUIP Take 1 tablet (2 mg total) by mouth at bedtime. For leg cramps   SYSTANE OP Place 1 drop into both eyes daily as needed (dry eyes).   triamcinolone cream 0.1 % Commonly known as: KENALOG Apply 1 application topically 2 (two) times daily.   vitamin C 1000 MG tablet Take 1,000 mg by mouth daily.   Vitamin D-3 25 MCG (1000 UT) Caps Take 2,000 Units by mouth daily.   zinc gluconate 50 MG tablet Take 50 mg by mouth daily.         Objective:   BP (!) 176/89   Pulse 87   Temp 97.8 F (36.6 C)   Ht '5\' 5"'$  (1.651 m)   Wt 223 lb (101.2 kg)   SpO2 95%   BMI 37.11 kg/m   Wt Readings from Last 3 Encounters:  07/22/21 223 lb (101.2 kg)  06/18/21 224 lb (101.6 kg)  05/12/21 227 lb (103 kg)    Physical Exam Vitals and nursing note reviewed.  Constitutional:      General: She is not in acute distress.    Appearance: She is well-developed. She is not diaphoretic.  Eyes:     Conjunctiva/sclera: Conjunctivae normal.  Cardiovascular:     Rate and Rhythm: Normal rate and regular rhythm.     Heart sounds: Normal heart sounds. No murmur heard. Pulmonary:     Effort: Pulmonary effort is normal. No respiratory distress.     Breath sounds: Normal breath sounds. No wheezing.  Abdominal:     General: Abdomen is flat. Bowel sounds are normal. There is no distension.     Palpations: Abdomen is soft.     Tenderness: There is abdominal tenderness in the right lower quadrant. There is no right CVA tenderness, left CVA tenderness, guarding or rebound. Negative signs include Murphy's sign and  McBurney's sign.     Hernia: No hernia is present.  Musculoskeletal:        General: No swelling or tenderness. Normal range of motion.  Skin:    General: Skin is warm and dry.     Findings: No rash.  Neurological:     Mental Status: She is alert and oriented to person, place, and time.     Coordination: Coordination normal.  Psychiatric:        Behavior: Behavior normal.       Assessment & Plan:  Problem List Items Addressed This Visit   None Visit Diagnoses     Abdominal pain, RLQ (right lower quadrant)    -  Primary   Right lower quadrant abdominal pain       Relevant Orders   CT ABDOMEN PELVIS W CONTRAST     Patient is having right lower quadrant abdominal pain, wonder rule out appendicitis and diverticulitis as possible causes.  Follow up plan: Return if symptoms worsen or fail to improve.  Counseling provided for all of the vaccine components Orders Placed This Encounter  Procedures   [place] Arron Tetrault, MD [facility] 07/22/2021, 8:24 AM

## 2021-07-23 ENCOUNTER — Other Ambulatory Visit: Payer: Self-pay | Admitting: Neurological Surgery

## 2021-07-23 ENCOUNTER — Ambulatory Visit: Payer: Medicare Other

## 2021-07-23 DIAGNOSIS — M47816 Spondylosis without myelopathy or radiculopathy, lumbar region: Secondary | ICD-10-CM

## 2021-07-28 ENCOUNTER — Ambulatory Visit: Payer: Medicare Other | Admitting: Orthopaedic Surgery

## 2021-07-28 ENCOUNTER — Telehealth: Payer: Self-pay | Admitting: Family Medicine

## 2021-07-28 NOTE — Telephone Encounter (Signed)
Patient is going to [facility] in the morning for a shot in her back but she told them that she thinks her hip is out of place and they would like to do an x ray before giving the shot. She said they told her to call and get [place] to send an order for an x ray. Her appointment is at 8:15 on 7/12. Please call back and let patient know if we are able to send the order.

## 2021-07-28 NOTE — Telephone Encounter (Signed)
Please send order to [facility] for hip xray

## 2021-07-29 ENCOUNTER — Ambulatory Visit
Admission: RE | Admit: 2021-07-29 | Discharge: 2021-07-29 | Disposition: A | Discharge status: Home / Self Care | Payer: Medicare Other | Source: Ambulatory Visit | Attending: Neurological Surgery | Admitting: Neurological Surgery

## 2021-07-29 ENCOUNTER — Other Ambulatory Visit: Payer: Self-pay

## 2021-07-29 DIAGNOSIS — M25559 Pain in unspecified hip: Secondary | ICD-10-CM

## 2021-07-29 DIAGNOSIS — M47816 Spondylosis without myelopathy or radiculopathy, lumbar region: Secondary | ICD-10-CM

## 2021-07-29 DIAGNOSIS — M5416 Radiculopathy, lumbar region: Secondary | ICD-10-CM | POA: Diagnosis not present

## 2021-07-29 MED ORDER — METHYLPREDNISOLONE ACETATE 40 MG/ML INJ SUSP (RADIOLOG
80.0000 mg | Freq: Once | INTRAMUSCULAR | Status: AC
  Administered 2021-07-29: 80 mg via INTRA_ARTICULAR

## 2021-07-29 MED ORDER — IOPAMIDOL (ISOVUE-M 200) INJECTION 41%
1.0000 mL | Freq: Once | INTRAMUSCULAR | Status: AC
  Administered 2021-07-29: 1 mL via INTRA_ARTICULAR

## 2021-07-29 NOTE — Discharge Instructions (Signed)

## 2021-07-29 NOTE — Telephone Encounter (Signed)
Orders place - not sure which hip and patient is already at appt - put in for right and left and we can cancel if they don't need both.

## 2021-08-25 NOTE — Progress Notes (Signed)
Surgery orders requested via Epic inbox. °

## 2021-08-26 ENCOUNTER — Other Ambulatory Visit: Payer: Self-pay | Admitting: Orthopedic Surgery

## 2021-08-26 DIAGNOSIS — G8929 Other chronic pain: Secondary | ICD-10-CM

## 2021-08-28 NOTE — Progress Notes (Addendum)
Comments: LEFT ARM RESTRICTION    COVID Vaccine received:  '[]'$  No '[x]'$  Yes Date of any COVID positive Test in last 24 days:none  PCP - Caryl Pina, MD  Cardiologist - Carlyle Dolly, MD (LOV 2014)   Chest x-ray - 2022  Epic EKG -  06-18-21  Epic  Stress Test - 2013 Epic ECHO - N/A Cardiac Cath - N/A  Pacemaker/ICD device     '[x]'$  N/A Spinal Cord Stimulator:'[x]'$  No '[]'$  Yes   Other Implants:   History of Sleep Apnea? '[x]'$  No '[]'$  Yes   Sleep Study Date:   CPAP used?- '[x]'$  No '[]'$  Yes  (Instruct to bring their mask & Tubing)  Does the patient monitor blood sugar?  '[x]'$  N/A  Blood Thinner Instructions: none Aspirin Instructions: stopped ASA 81 mg 2 weeks ago on her own. Last Dose:  ERAS Protocol Ordered: '[]'$  No  '[x]'$  Yes PRE-SURGERY '[x]'$  ENSURE  '[]'$  G2   Activity level: Patient can not climb a flight of stairs without difficulty;  '[x]'$  No CP   but would have _SOB__   Anesthesia review: 06-18-21 abnormal EKG (Poor R-Wave progression) Per patient no f/u w/ cardiology since this EKG  Patient denies shortness of breath, fever, cough and chest pain at PAT appointment.  She is having "sinus Drainage" today  Patient verbalized understanding and agreement to the Pre-Surgical Instructions that were given to them at this PAT appointment. Patient was also educated of the need to review these PAT instructions again prior to his/her surgery.I reviewed the appropriate phone numbers to call if they have any and questions or concerns.

## 2021-08-28 NOTE — Patient Instructions (Addendum)
DUE TO SPACE LIMITATIONS, ONLY TWO VISITORS  (aged 86 and older) ARE ALLOWED TO COME WITH YOU AND STAY IN THE WAITING ROOM DURING YOUR PRE OP AND PROCEDURE.   **NO VISITORS ARE ALLOWED IN THE SHORT STAY AREA OR RECOVERY ROOM!!**  IF YOU WILL BE ADMITTED INTO THE HOSPITAL YOU ARE ALLOWED ONLY FOUR SUPPORT PEOPLE DURING VISITATION HOURS (7 AM -8PM)   The support person(s) must pass our screening, and use Hand sanitizing gel. Visitors GUEST BADGE MUST BE WORN VISIBLY  One adult visitor may remain with you overnight and MUST be in the room by 8 P.M.   You are not required to quarantine at this time prior to your surgery. However, you must do this: Hand Hygiene often Do NOT share personal items Notify your provider if you are in close contact with someone who has COVID or you develop fever 100.4 or greater, new onset of sneezing, cough, sore throat, shortness of breath or body aches.       Your procedure is scheduled on:Wednesday September 09, 2021    Report to [facility] Main Entrance.  Report to admitting at:  05:15   AM  +++++Call this number if you have any questions or problems the morning of surgery [phone]  Do not eat food :After Midnight the night prior to your surgery/procedure.  After Midnight you may have the following liquids until   04:15 AM DAY OF SURGERY  Clear Liquid Diet Water Black Coffee (sugar ok, NO MILK/CREAM OR CREAMERS)  Tea (sugar ok, NO MILK/CREAM OR CREAMERS) regular and decaf                             Plain Jell-O (NO RED)                                           Fruit ices (not with fruit pulp, NO RED)                                     Popsicles (NO RED)                                                                  Juice: apple, WHITE grape, WHITE cranberry Sports drinks like Gatorade (NO RED)                   The day of surgery:  Drink ONE (1) Pre-Surgery Clear Ensure at   04:15  AM the morning of surgery. Drink in one sitting. Do  not sip.  This drink was given to you during your hospital pre-op appointment visit. Nothing else to drink after completing the Pre-Surgery Clear Ensure    FOLLOW ANY ADDITIONAL PRE OP INSTRUCTIONS YOU RECEIVED FROM YOUR SURGEON'S OFFICE!!!   Oral Hygiene is also important to reduce your risk of infection.        Remember - BRUSH YOUR TEETH THE MORNING OF SURGERY WITH YOUR REGULAR TOOTHPASTE  Take ONLY these medicines the morning of surgery with A SIP OF  WATER:  Amlodipine, famotidine (Pepcid). If needed you may take Tylenol, and use your Albuterol Inhaler and Systane eye drops.                 You may not have any metal on your body including hair pins, jewelry, and body piercing  Do not wear make-up, lotions, powders, perfumes, or deodorant  Do not wear nail polish including gel and S&S, artificial / acrylic nails, or any other type of covering on natural nails including finger and toenails. If you have artificial nails, gel coating, etc., that needs to be removed by a nail salon, Please have this removed prior to surgery. Not doing so may mean that your surgery could be cancelled or delayed if the Surgeon or anesthesia staff feels like they are unable to monitor you safely.   Do not shave 48 hours prior to surgery to avoid nicks in your skin which may contribute to postoperative infections.   Contacts, Hearing Aids, dentures or bridgework may not be worn into surgery.   You may bring a small overnight bag with you on the day of surgery, only pack items that are not valuable .[facility] IS NOT RESPONSIBLE   FOR VALUABLES THAT ARE LOST OR STOLEN.   DO NOT [place]. PHARMACY WILL DISPENSE MEDICATIONS LISTED ON YOUR MEDICATION LIST TO YOU DURING YOUR ADMISSION [place]!   Special Instructions: Bring a copy of your healthcare power of attorney and living will documents the day of surgery, if you wish to have them scanned into your [facility]  Records- EPIC  Please read over the following fact sheets you were given: IF YOU HAVE QUESTIONS ABOUT YOUR PRE-OP INSTRUCTIONS, PLEASE CALL [phone]  ([place])   [place] - Preparing for Surgery Before surgery, you can play an important role.  Because skin is not sterile, your skin needs to be as free of germs as possible.  You can reduce the number of germs on your skin by washing with CHG (chlorahexidine gluconate) soap before surgery.  CHG is an antiseptic cleaner which kills germs and bonds with the skin to continue killing germs even after washing. Please DO NOT use if you have an allergy to CHG or antibacterial soaps.  If your skin becomes reddened/irritated stop using the CHG and inform your nurse when you arrive at Short Stay. Do not shave (including legs and underarms) for at least 48 hours prior to the first CHG shower.  You may shave your face/neck.  Please follow these instructions carefully:  1.  Shower with CHG Soap the night before surgery and the  morning of surgery.  2.  If you choose to wash your hair, wash your hair first as usual with your normal  shampoo.  3.  After you shampoo, rinse your hair and body thoroughly to remove the shampoo.                             4.  Use CHG as you would any other liquid soap.  You can apply chg directly to the skin and wash.  Gently with a scrungie or clean washcloth.  5.  Apply the CHG Soap to your body ONLY FROM THE NECK DOWN.   Do not use on face/ open  Wound or open sores. Avoid contact with eyes, ears mouth and genitals (private parts).                       Wash face,  Genitals (private parts) with your normal soap.             6.  Wash thoroughly, paying special attention to the area where your  surgery  will be performed.  7.  Thoroughly rinse your body with warm water from the neck down.  8.  DO NOT shower/wash with your normal soap after using and rinsing off the CHG Soap.            9.  Pat yourself  dry with a clean towel.            10.  Wear clean pajamas.            11.  Place clean sheets on your bed the night of your first shower and do not  sleep with pets.  ON THE DAY OF SURGERY : Do not apply any lotions/deodorants the morning of surgery.  Please wear clean clothes to the hospital/surgery center.    FAILURE TO FOLLOW THESE INSTRUCTIONS MAY RESULT IN THE CANCELLATION OF YOUR SURGERY  PATIENT SIGNATURE_________________________________  NURSE SIGNATURE__________________________________  ________________________________________________________________________      Anna Bradford    An incentive spirometer is a tool that can help keep your lungs clear and active. This tool measures how well you are filling your lungs with each breath. Taking long deep breaths may help reverse or decrease the chance of developing breathing (pulmonary) problems (especially infection) following: A long period of time when you are unable to move or be active. BEFORE THE PROCEDURE  If the spirometer includes an indicator to show your best effort, your nurse or respiratory therapist will set it to a desired goal. If possible, sit up straight or lean slightly forward. Try not to slouch. Hold the incentive spirometer in an upright position. INSTRUCTIONS FOR USE  Sit on the edge of your bed if possible, or sit up as far as you can in bed or on a chair. Hold the incentive spirometer in an upright position. Breathe out normally. Place the mouthpiece in your mouth and seal your lips tightly around it. Breathe in slowly and as deeply as possible, raising the piston or the ball toward the top of the column. Hold your breath for 3-5 seconds or for as long as possible. Allow the piston or ball to fall to the bottom of the column. Remove the mouthpiece from your mouth and breathe out normally. Rest for a few seconds and repeat Steps 1 through 7 at least 10 times every 1-2 hours when you are awake.  Take your time and take a few normal breaths between deep breaths. The spirometer may include an indicator to show your best effort. Use the indicator as a goal to work toward during each repetition. After each set of 10 deep breaths, practice coughing to be sure your lungs are clear. If you have an incision (the cut made at the time of surgery), support your incision when coughing by placing a pillow or rolled up towels firmly against it. Once you are able to get out of bed, walk around indoors and cough well. You may stop using the incentive spirometer when instructed by your caregiver.  RISKS AND COMPLICATIONS Take your time so you do not get dizzy or light-headed. If you are in  pain, you may need to take or ask for pain medication before doing incentive spirometry. It is harder to take a deep breath if you are having pain. AFTER USE Rest and breathe slowly and easily. It can be helpful to keep track of a log of your progress. Your caregiver can provide you with a simple table to help with this. If you are using the spirometer at home, follow these instructions: [place] IF:  You are having difficultly using the spirometer. You have trouble using the spirometer as often as instructed. Your pain medication is not giving enough relief while using the spirometer. You develop fever of 100.5 F (38.1 C) or higher.                                                                                                    SEEK IMMEDIATE MEDICAL CARE IF:  You cough up bloody sputum that had not been present before. You develop fever of 102 F (38.9 C) or greater. You develop worsening pain at or near the incision site. MAKE SURE YOU:  Understand these instructions. Will watch your condition. Will get help right away if you are not doing well or get worse. Document Released: 05/17/2006 Document Revised: 03/29/2011 Document Reviewed: 07/18/2006 [facility] Patient Information 2014 [place], [place].

## 2021-08-31 ENCOUNTER — Encounter (HOSPITAL_COMMUNITY)
Admission: RE | Admit: 2021-08-31 | Discharge: 2021-08-31 | Disposition: A | Discharge status: Home / Self Care | Payer: Medicare Other | Source: Ambulatory Visit | Attending: Orthopedic Surgery | Admitting: Orthopedic Surgery

## 2021-08-31 ENCOUNTER — Encounter (HOSPITAL_COMMUNITY): Payer: Self-pay

## 2021-08-31 ENCOUNTER — Other Ambulatory Visit: Payer: Self-pay

## 2021-08-31 VITALS — BP 142/70 | HR 82 | Temp 98.3°F | Resp 18 | Ht 64.0 in | Wt 224.0 lb

## 2021-08-31 DIAGNOSIS — Z01812 Encounter for preprocedural laboratory examination: Secondary | ICD-10-CM | POA: Insufficient documentation

## 2021-08-31 DIAGNOSIS — Z01818 Encounter for other preprocedural examination: Secondary | ICD-10-CM

## 2021-08-31 DIAGNOSIS — M25562 Pain in left knee: Secondary | ICD-10-CM | POA: Diagnosis not present

## 2021-08-31 DIAGNOSIS — G8929 Other chronic pain: Secondary | ICD-10-CM | POA: Diagnosis not present

## 2021-08-31 HISTORY — DX: Personal history of urinary calculi: Z87.442

## 2021-08-31 LAB — COMPREHENSIVE METABOLIC PANEL
ALT: 20 U/L (ref 0–44)
AST: 22 U/L (ref 15–41)
Albumin: 3.5 g/dL (ref 3.5–5.0)
Alkaline Phosphatase: 69 U/L (ref 38–126)
Anion gap: 6 (ref 5–15)
BUN: 12 mg/dL (ref 8–23)
CO2: 31 mmol/L (ref 22–32)
Calcium: 9.3 mg/dL (ref 8.9–10.3)
Chloride: 106 mmol/L (ref 98–111)
Creatinine, Ser: 0.7 mg/dL (ref 0.44–1.00)
GFR, Estimated: >60 mL/min (ref >60)
Glucose, Bld: 91 mg/dL (ref 70–99)
Potassium: 3.7 mmol/L (ref 3.5–5.1)
Sodium: 143 mmol/L (ref 135–145)
Total Bilirubin: 0.4 mg/dL (ref 0.3–1.2)
Total Protein: 6.4 g/dL — ABNORMAL LOW (ref 6.5–8.1)

## 2021-08-31 LAB — CBC WITH DIFFERENTIAL/PLATELET
Abs Immature Granulocytes: 0.08 10*3/uL — ABNORMAL HIGH (ref 0.00–0.07)
Basophils Absolute: 0.1 10*3/uL (ref 0.0–0.1)
Basophils Relative: 1 %
Eosinophils Absolute: 0.2 10*3/uL (ref 0.0–0.5)
Eosinophils Relative: 3 %
HCT: 41.4 % (ref 36.0–46.0)
Hemoglobin: 13.7 g/dL (ref 12.0–15.0)
Immature Granulocytes: 1 %
Lymphocytes Relative: 28 %
Lymphs Abs: 2.3 10*3/uL (ref 0.7–4.0)
MCH: 31.5 pg (ref 26.0–34.0)
MCHC: 33.1 g/dL (ref 30.0–36.0)
MCV: 95.2 fL (ref 80.0–100.0)
Monocytes Absolute: 0.7 10*3/uL (ref 0.1–1.0)
Monocytes Relative: 9 %
Neutro Abs: 4.7 10*3/uL (ref 1.7–7.7)
Neutrophils Relative %: 58 %
Platelets: 270 10*3/uL (ref 150–400)
RBC: 4.35 MIL/uL (ref 3.87–5.11)
RDW: 14.2 % (ref 11.5–15.5)
WBC: 8.1 10*3/uL (ref 4.0–10.5)
nRBC: 0 % (ref 0.0–0.2)

## 2021-08-31 LAB — SURGICAL PCR SCREEN
MRSA, PCR: POSITIVE — AB
Staphylococcus aureus: POSITIVE — AB

## 2021-09-01 NOTE — Progress Notes (Signed)
Patient's PCR screen is positive for MRSA and STAPH. Appropriate notes have been placed on the patient's chart. This note has been routed to Dr. Ronnie Derby for review. The Patient's surgery is currently Scheduled for: September 09, 2021  Anna Bradford, BSN, CVRN-BC   Pre-Surgical Testing Nurse [place]  [phone]    Contains abnormal data Surgical pcr screen Order: [id number] Status: Final result    Visible to patient: No (inaccessible in [place])    Next appt: 09/17/2021 at 01:00 PM in Rehabilitation ([place], [place], PT)    Dx: Pre-op testing    Specimen Information: Nasal Mucosa; Nasal Swab  0 Result Notes     Component Ref Range & Units 1 d ago  MRSA, PCR NEGATIVE POSITIVE Abnormal    Staphylococcus aureus NEGATIVE POSITIVE Abnormal    Comment: (NOTE)  The Xpert SA Assay (FDA approved for NASAL specimens in patients 38  years of age and older), is one component of a comprehensive  surveillance program. It is not intended to diagnose infection nor to  guide or monitor treatment.  Performed at [facility], [place] [address],  [place], [place] [address]   Resulting Agency  [facility]         Specimen Collected: 08/31/21 11:31 Last Resulted: 08/31/21 18:04

## 2021-09-08 NOTE — Anesthesia Preprocedure Evaluation (Signed)
Anesthesia Evaluation  Patient identified by MRN, date of birth, ID band Patient awake    Reviewed: Allergy & Precautions, NPO status , Patient's Chart, lab work & pertinent test results  Airway Mallampati: II  TM Distance: >3 FB Neck ROM: Full    Dental  (+) Dental Advisory Given, Missing   Pulmonary neg pulmonary ROS,    Pulmonary exam normal breath sounds clear to auscultation       Cardiovascular hypertension, Pt. on medications Normal cardiovascular exam Rhythm:Regular Rate:Normal     Neuro/Psych negative neurological ROS     GI/Hepatic Neg liver ROS, GERD  Medicated,  Endo/Other  Obesity   Renal/GU negative Renal ROS     Musculoskeletal  (+) Arthritis , Osteoarthritis,    Abdominal   Peds  Hematology negative hematology ROS (+) Plt 270k   Anesthesia Other Findings H/o L breast cancer   Reproductive/Obstetrics                            Anesthesia Physical Anesthesia Plan  ASA: 3  Anesthesia Plan: Spinal   Post-op Pain Management: Regional block* and Tylenol PO (pre-op)*   Induction: Intravenous  PONV Risk Score and Plan: 2 and TIVA, Dexamethasone and Ondansetron  Airway Management Planned: Nasal Cannula and Natural Airway  Additional Equipment:   Intra-op Plan:   Post-operative Plan:   Informed Consent: I have reviewed the patients History and Physical, chart, labs and discussed the procedure including the risks, benefits and alternatives for the proposed anesthesia with the patient or authorized representative who has indicated his/her understanding and acceptance.     Dental advisory given  Plan Discussed with: CRNA  Anesthesia Plan Comments:        Anesthesia Quick Evaluation

## 2021-09-09 ENCOUNTER — Encounter (HOSPITAL_COMMUNITY)
Admission: RE | Disposition: A | Discharge status: Home / Self Care | Payer: Self-pay | Source: Home / Self Care | Attending: Orthopedic Surgery

## 2021-09-09 ENCOUNTER — Encounter (HOSPITAL_COMMUNITY): Payer: Self-pay | Admitting: Orthopedic Surgery

## 2021-09-09 ENCOUNTER — Ambulatory Visit (HOSPITAL_BASED_OUTPATIENT_CLINIC_OR_DEPARTMENT_OTHER): Payer: Medicare Other | Admitting: Anesthesiology

## 2021-09-09 ENCOUNTER — Ambulatory Visit (HOSPITAL_COMMUNITY): Payer: Medicare Other | Admitting: Physician Assistant

## 2021-09-09 ENCOUNTER — Ambulatory Visit (HOSPITAL_COMMUNITY)
Admission: RE | Admit: 2021-09-09 | Discharge: 2021-09-09 | Disposition: A | Discharge status: Home / Self Care | Payer: Medicare Other | Attending: Orthopedic Surgery | Admitting: Orthopedic Surgery

## 2021-09-09 ENCOUNTER — Other Ambulatory Visit: Payer: Self-pay

## 2021-09-09 DIAGNOSIS — M1712 Unilateral primary osteoarthritis, left knee: Secondary | ICD-10-CM | POA: Insufficient documentation

## 2021-09-09 DIAGNOSIS — Z6838 Body mass index (BMI) 38.0-38.9, adult: Secondary | ICD-10-CM | POA: Diagnosis not present

## 2021-09-09 DIAGNOSIS — K219 Gastro-esophageal reflux disease without esophagitis: Secondary | ICD-10-CM | POA: Insufficient documentation

## 2021-09-09 DIAGNOSIS — Z853 Personal history of malignant neoplasm of breast: Secondary | ICD-10-CM | POA: Insufficient documentation

## 2021-09-09 DIAGNOSIS — Z79899 Other long term (current) drug therapy: Secondary | ICD-10-CM | POA: Diagnosis not present

## 2021-09-09 DIAGNOSIS — E669 Obesity, unspecified: Secondary | ICD-10-CM | POA: Insufficient documentation

## 2021-09-09 DIAGNOSIS — I10 Essential (primary) hypertension: Secondary | ICD-10-CM

## 2021-09-09 DIAGNOSIS — G8918 Other acute postprocedural pain: Secondary | ICD-10-CM | POA: Diagnosis not present

## 2021-09-09 HISTORY — PX: TOTAL KNEE ARTHROPLASTY: SHX125

## 2021-09-09 SURGERY — ARTHROPLASTY, KNEE, TOTAL
Anesthesia: Spinal | Site: Knee | Laterality: Left

## 2021-09-09 MED ORDER — BUPIVACAINE IN DEXTROSE 0.75-8.25 % IT SOLN
INTRATHECAL | Status: DC | PRN
  Administered 2021-09-09: 1.6 mL via INTRATHECAL

## 2021-09-09 MED ORDER — ONDANSETRON HCL 4 MG/2ML IJ SOLN
INTRAMUSCULAR | Status: DC | PRN
  Administered 2021-09-09: 4 mg via INTRAVENOUS

## 2021-09-09 MED ORDER — AMLODIPINE BESYLATE 10 MG PO TABS
10.0000 mg | ORAL_TABLET | Freq: Every day | ORAL | Status: DC
  Administered 2021-09-09: 10 mg via ORAL
  Filled 2021-09-09: qty 1

## 2021-09-09 MED ORDER — BUPIVACAINE LIPOSOME 1.3 % IJ SUSP
INTRAMUSCULAR | Status: AC
  Filled 2021-09-09: qty 20

## 2021-09-09 MED ORDER — DEXAMETHASONE SODIUM PHOSPHATE 10 MG/ML IJ SOLN
INTRAMUSCULAR | Status: DC | PRN
  Administered 2021-09-09: 4 mg via INTRAVENOUS

## 2021-09-09 MED ORDER — PROPOFOL 500 MG/50ML IV EMUL
INTRAVENOUS | Status: DC | PRN
  Administered 2021-09-09: 50 ug/kg/min via INTRAVENOUS

## 2021-09-09 MED ORDER — CEFAZOLIN SODIUM-DEXTROSE 2-4 GM/100ML-% IV SOLN
2.0000 g | INTRAVENOUS | Status: AC
  Administered 2021-09-09: 2 g via INTRAVENOUS
  Filled 2021-09-09: qty 100

## 2021-09-09 MED ORDER — HYDROCHLOROTHIAZIDE 25 MG PO TABS
25.0000 mg | ORAL_TABLET | Freq: Every day | ORAL | Status: DC
  Administered 2021-09-09: 25 mg via ORAL
  Filled 2021-09-09: qty 1

## 2021-09-09 MED ORDER — ONDANSETRON HCL 4 MG/2ML IJ SOLN
INTRAMUSCULAR | Status: AC
  Filled 2021-09-09: qty 6

## 2021-09-09 MED ORDER — ROPIVACAINE HCL 5 MG/ML IJ SOLN
INTRAMUSCULAR | Status: DC | PRN
  Administered 2021-09-09: 20 mL via PERINEURAL

## 2021-09-09 MED ORDER — CHLORHEXIDINE GLUCONATE 0.12 % MT SOLN
15.0000 mL | Freq: Once | OROMUCOSAL | Status: AC
  Administered 2021-09-09: 15 mL via OROMUCOSAL

## 2021-09-09 MED ORDER — PROPOFOL 10 MG/ML IV BOLUS
INTRAVENOUS | Status: AC
  Filled 2021-09-09: qty 20

## 2021-09-09 MED ORDER — DEXAMETHASONE SODIUM PHOSPHATE 10 MG/ML IJ SOLN: 8.0000 mg | Freq: Once | INTRAMUSCULAR | Status: DC

## 2021-09-09 MED ORDER — TRANEXAMIC ACID-NACL 1000-0.7 MG/100ML-% IV SOLN
1000.0000 mg | INTRAVENOUS | Status: AC
  Administered 2021-09-09: 1000 mg via INTRAVENOUS
  Filled 2021-09-09: qty 100

## 2021-09-09 MED ORDER — VANCOMYCIN HCL IN DEXTROSE 1-5 GM/200ML-% IV SOLN
1000.0000 mg | Freq: Once | INTRAVENOUS | Status: AC
  Administered 2021-09-09: 1000 mg via INTRAVENOUS
  Filled 2021-09-09: qty 200

## 2021-09-09 MED ORDER — DEXAMETHASONE SODIUM PHOSPHATE 10 MG/ML IJ SOLN
INTRAMUSCULAR | Status: AC
  Filled 2021-09-09: qty 3

## 2021-09-09 MED ORDER — FENTANYL CITRATE PF 50 MCG/ML IJ SOSY: 25.0000 ug | PREFILLED_SYRINGE | INTRAMUSCULAR | Status: DC | PRN

## 2021-09-09 MED ORDER — ACETAMINOPHEN 500 MG PO TABS: 1000.0000 mg | ORAL_TABLET | Freq: Four times a day (QID) | ORAL | Status: DC

## 2021-09-09 MED ORDER — LOSARTAN POTASSIUM 50 MG PO TABS
100.0000 mg | ORAL_TABLET | Freq: Every day | ORAL | Status: DC
  Administered 2021-09-09: 100 mg via ORAL
  Filled 2021-09-09: qty 2

## 2021-09-09 MED ORDER — ACETAMINOPHEN 500 MG PO TABS
1000.0000 mg | ORAL_TABLET | Freq: Once | ORAL | Status: AC
  Administered 2021-09-09: 1000 mg via ORAL
  Filled 2021-09-09: qty 2

## 2021-09-09 MED ORDER — PHENYLEPHRINE 80 MCG/ML (10ML) SYRINGE FOR IV PUSH (FOR BLOOD PRESSURE SUPPORT)
PREFILLED_SYRINGE | INTRAVENOUS | Status: DC | PRN
  Administered 2021-09-09 (×14): 80 ug via INTRAVENOUS

## 2021-09-09 MED ORDER — PROPOFOL 1000 MG/100ML IV EMUL
INTRAVENOUS | Status: AC
  Filled 2021-09-09: qty 100

## 2021-09-09 MED ORDER — OXYCODONE HCL 5 MG PO TABS: 5.0000 mg | ORAL_TABLET | ORAL | Status: DC | PRN

## 2021-09-09 MED ORDER — PHENYLEPHRINE 80 MCG/ML (10ML) SYRINGE FOR IV PUSH (FOR BLOOD PRESSURE SUPPORT)
PREFILLED_SYRINGE | INTRAVENOUS | Status: AC
  Filled 2021-09-09: qty 20

## 2021-09-09 MED ORDER — POVIDONE-IODINE 10 % EX SWAB
2.0000 | Freq: Once | CUTANEOUS | Status: AC
  Administered 2021-09-09: 2 via TOPICAL

## 2021-09-09 MED ORDER — FENTANYL CITRATE (PF) 100 MCG/2ML IJ SOLN
INTRAMUSCULAR | Status: DC | PRN
  Administered 2021-09-09: 50 ug via INTRAVENOUS

## 2021-09-09 MED ORDER — OXYCODONE HCL 5 MG PO TABS
ORAL_TABLET | ORAL | Status: AC
  Administered 2021-09-09: 10 mg via ORAL
  Filled 2021-09-09: qty 2

## 2021-09-09 MED ORDER — GABAPENTIN 300 MG PO CAPS
300.0000 mg | ORAL_CAPSULE | Freq: Once | ORAL | Status: AC
  Administered 2021-09-09: 300 mg via ORAL
  Filled 2021-09-09: qty 1

## 2021-09-09 MED ORDER — PHENYLEPHRINE HCL-NACL 20-0.9 MG/250ML-% IV SOLN
INTRAVENOUS | Status: AC
  Filled 2021-09-09: qty 750

## 2021-09-09 MED ORDER — LACTATED RINGERS IV SOLN: INTRAVENOUS | Status: DC

## 2021-09-09 MED ORDER — ASPIRIN 81 MG PO TBEC: 81.0000 mg | DELAYED_RELEASE_TABLET | Freq: Two times a day (BID) | ORAL | 0 refills | Status: AC

## 2021-09-09 MED ORDER — ONDANSETRON HCL 4 MG/2ML IJ SOLN
4.0000 mg | Freq: Once | INTRAMUSCULAR | Status: DC | PRN
Start: 2021-09-09 — End: 2021-09-09

## 2021-09-09 MED ORDER — ORAL CARE MOUTH RINSE: 15.0000 mL | Freq: Once | OROMUCOSAL | Status: AC

## 2021-09-09 MED ORDER — LIDOCAINE HCL (PF) 2 % IJ SOLN
INTRAMUSCULAR | Status: AC
  Filled 2021-09-09: qty 15

## 2021-09-09 MED ORDER — SODIUM CHLORIDE (PF) 0.9 % IJ SOLN
INTRAMUSCULAR | Status: AC
  Filled 2021-09-09: qty 20

## 2021-09-09 MED ORDER — LACTATED RINGERS IV BOLUS
500.0000 mL | Freq: Once | INTRAVENOUS | Status: AC
  Administered 2021-09-09: 500 mL via INTRAVENOUS

## 2021-09-09 MED ORDER — SODIUM CHLORIDE 0.9 % IV SOLN
INTRAVENOUS | Status: DC | PRN
  Administered 2021-09-09: 70 mL

## 2021-09-09 MED ORDER — FENTANYL CITRATE (PF) 100 MCG/2ML IJ SOLN
INTRAMUSCULAR | Status: AC
  Filled 2021-09-09: qty 2

## 2021-09-09 MED ORDER — BUPIVACAINE LIPOSOME 1.3 % IJ SUSP: 20.0000 mL | Freq: Once | INTRAMUSCULAR | Status: DC

## 2021-09-09 MED ORDER — BUPIVACAINE-EPINEPHRINE (PF) 0.5% -1:200000 IJ SOLN
INTRAMUSCULAR | Status: AC
  Filled 2021-09-09: qty 30

## 2021-09-09 MED ORDER — SODIUM CHLORIDE 0.9 % IR SOLN
Status: DC | PRN
  Administered 2021-09-09 (×2): 1000 mL

## 2021-09-09 MED ORDER — LACTATED RINGERS IV BOLUS
250.0000 mL | Freq: Once | INTRAVENOUS | Status: AC
  Administered 2021-09-09: 250 mL via INTRAVENOUS

## 2021-09-09 MED ORDER — OXYCODONE HCL 5 MG PO TABS: 5.0000 mg | ORAL_TABLET | Freq: Four times a day (QID) | ORAL | 0 refills | Status: DC | PRN

## 2021-09-09 MED ORDER — ACETAMINOPHEN 500 MG PO TABS: 1000.0000 mg | ORAL_TABLET | Freq: Four times a day (QID) | ORAL | 0 refills | Status: DC

## 2021-09-09 SURGICAL SUPPLY — 62 items
ARTISURF 10M PLY L 6-9EF KNEE (Knees) IMPLANT
BAG COUNTER SPONGE SURGICOUNT (BAG) IMPLANT
BAG SPEC THK2 15X12 ZIP CLS (MISCELLANEOUS) ×1
BAG SPNG CNTER NS LX DISP (BAG)
BAG ZIPLOCK 12X15 (MISCELLANEOUS) ×2 IMPLANT
BLADE SAGITTAL 13X1.27X60 (BLADE) ×2 IMPLANT
BLADE SAW SGTL 18X1.27X75 (BLADE) ×2 IMPLANT
BLADE SURG 15 STRL LF DISP TIS (BLADE) ×2 IMPLANT
BLADE SURG 15 STRL SS (BLADE) ×1
BLADE SURG SZ10 CARB STEEL (BLADE) ×4 IMPLANT
BNDG ELASTIC 6X5.8 VLCR STR LF (GAUZE/BANDAGES/DRESSINGS) ×2 IMPLANT
BOWL SMART MIX CTS (DISPOSABLE) ×2 IMPLANT
BSPLAT TIB 5D E CMNT STM LT (Knees) ×1 IMPLANT
CEMENT BONE R 1X40 (Cement) ×4 IMPLANT
COVER SURGICAL LIGHT HANDLE (MISCELLANEOUS) ×2 IMPLANT
CUFF TOURN SGL QUICK 34 (TOURNIQUET CUFF) ×1
CUFF TRNQT CYL 34X4.125X (TOURNIQUET CUFF) ×2 IMPLANT
DRAPE INCISE IOBAN 66X45 STRL (DRAPES) ×4 IMPLANT
DRAPE U-SHAPE 47X51 STRL (DRAPES) ×2 IMPLANT
DRSG AQUACEL AG ADV 3.5X10 (GAUZE/BANDAGES/DRESSINGS) ×2 IMPLANT
DURAPREP 26ML APPLICATOR (WOUND CARE) ×4 IMPLANT
ELECT REM PT RETURN 15FT ADLT (MISCELLANEOUS) ×2 IMPLANT
FEMUR  CMT CCR STD SZ8 L KNEE (Knees) ×1 IMPLANT
FEMUR CMT CCR STD SZ8 L KNEE (Knees) ×1 IMPLANT
FEMUR CMTD CCR STD SZ8 L KNEE (Knees) IMPLANT
GLOVE BIOGEL PI IND STRL 7.5 (GLOVE) ×2 IMPLANT
GLOVE BIOGEL PI IND STRL 8.5 (GLOVE) ×4 IMPLANT
GLOVE BIOGEL PI INDICATOR 7.5 (GLOVE) ×1
GLOVE BIOGEL PI INDICATOR 8.5 (GLOVE) ×2
GLOVE SURG LX 7.5 STRW (GLOVE) ×1
GLOVE SURG LX 8.0 MICRO (GLOVE) ×2
GLOVE SURG LX STRL 7.5 STRW (GLOVE) ×2 IMPLANT
GLOVE SURG LX STRL 8.0 MICRO (GLOVE) ×4 IMPLANT
GLOVE SURG ORTHO 8.0 STRL STRW (GLOVE) ×4 IMPLANT
GOWN STRL REUS W/ TWL XL LVL3 (GOWN DISPOSABLE) ×4 IMPLANT
GOWN STRL REUS W/TWL XL LVL3 (GOWN DISPOSABLE) ×2
HANDPIECE INTERPULSE COAX TIP (DISPOSABLE) ×1
HOLDER FOLEY CATH W/STRAP (MISCELLANEOUS) ×2 IMPLANT
HOOD PEEL AWAY FLYTE STAYCOOL (MISCELLANEOUS) ×6 IMPLANT
KIT TURNOVER KIT A (KITS) IMPLANT
MANIFOLD NEPTUNE II (INSTRUMENTS) ×2 IMPLANT
NDL HYPO 21X1.5 SAFETY (NEEDLE) ×2 IMPLANT
NEEDLE HYPO 21X1.5 SAFETY (NEEDLE) ×1 IMPLANT
NS IRRIG 1000ML POUR BTL (IV SOLUTION) ×2 IMPLANT
PACK TOTAL KNEE CUSTOM (KITS) ×2 IMPLANT
PROTECTOR NERVE ULNAR (MISCELLANEOUS) ×2 IMPLANT
SET HNDPC FAN SPRY TIP SCT (DISPOSABLE) ×2 IMPLANT
SPIKE FLUID TRANSFER (MISCELLANEOUS) ×4 IMPLANT
STEM POLY PAT PLY 35M KNEE (Knees) IMPLANT
STEM TIBIA 5 DEG SZ E L KNEE (Knees) IMPLANT
STRIP CLOSURE SKIN 1/2X4 (GAUZE/BANDAGES/DRESSINGS) ×2 IMPLANT
SUT BONE WAX W31G (SUTURE) ×2 IMPLANT
SUT MNCRL AB 3-0 PS2 18 (SUTURE) ×2 IMPLANT
SUT STRATAFIX 0 PDS 27 VIOLET (SUTURE) ×1
SUT STRATAFIX 1PDS 45CM VIOLET (SUTURE) ×2 IMPLANT
SUT VIC AB 1 CT1 36 (SUTURE) ×2 IMPLANT
SUTURE STRATFX 0 PDS 27 VIOLET (SUTURE) ×2 IMPLANT
SYR 30ML LL (SYRINGE) ×4 IMPLANT
TIBIA STEM 5 DEG SZ E L KNEE (Knees) ×1 IMPLANT
TRAY FOLEY MTR SLVR 16FR STAT (SET/KITS/TRAYS/PACK) ×2 IMPLANT
WATER STERILE IRR 1000ML POUR (IV SOLUTION) ×4 IMPLANT
WRAP KNEE MAXI GEL POST OP (GAUZE/BANDAGES/DRESSINGS) ×2 IMPLANT

## 2021-09-09 NOTE — Evaluation (Signed)
Physical Therapy Evaluation Patient Details Name: Anna Bradford MRN: [id number] DOB: [birth year] Today's Date: 09/09/2021  History of Present Illness  88 yo female S/P LTKA 09/09/21.  Clinical Impression  The patient able to ambulate 16' using RW, patient's caregiver present and well versed in rehab and care of TKA with recent surgery herself.  Patient has a ramp and RW, will plan sponge bath until able to step over tub. OPPT starts next week.  Patient will DC home today.  Patient has met goals for Dc with family support.      Recommendations for follow up therapy are one component of a multi-disciplinary discharge planning process, led by the attending physician.  Recommendations may be updated based on patient status, additional functional criteria and insurance authorization.  Follow Up Recommendations Follow physician's recommendations for discharge plan and follow up therapies      Assistance Recommended at Discharge Intermittent Supervision/Assistance  Patient can return home with the following  A little help with walking and/or transfers;Direct supervision/assist for medications management;Help with stairs or ramp for entrance    Equipment Recommendations None recommended by PT  Recommendations for Other Services       Functional Status Assessment Patient has had a recent decline in their functional status and demonstrates the ability to make significant improvements in function in a reasonable and predictable amount of time.     Precautions / Restrictions Precautions Precautions: Fall;Knee      Mobility  Bed Mobility Overal bed mobility: Needs Assistance Bed Mobility: Supine to Sit, Sit to Supine     Supine to sit: Min assist Sit to supine: Min assist   General bed mobility comments: assist with legs and trunk du to body habitus    Transfers Overall transfer level: Needs assistance Equipment used: Rolling walker (2 wheels) Transfers: Sit to/from Stand, Bed to  chair/wheelchair/BSC Sit to Stand: Min assist   Step pivot transfers: Min assist       General transfer comment: cues for safety, pivot to [facility] from stretcher.    Ambulation/Gait Ambulation/Gait assistance: Min assist Gait Distance (Feet): 50 Feet Assistive device: Rolling walker (2 wheels) Gait Pattern/deviations: Step-to pattern, Step-through pattern Gait velocity: decr     General Gait Details: gait is steady with RW.  Stairs            Wheelchair Mobility    Modified Rankin (Stroke Patients Only)       Balance Overall balance assessment: Mild deficits observed, not formally tested                                           Pertinent Vitals/Pain Pain Assessment Pain Assessment: 0-10 Pain Score: 4  Pain Location: thigh and back of knee Pain Descriptors / Indicators: Aching, Discomfort Pain Intervention(s): Monitored during session, Premedicated before session    [place] expects to be discharged to:: Private residence Living Arrangements: Alone Available Help at Discharge: Family;Available 24 hours/day;Friend(s) Type of Home: House Home Access: Ramped entrance       Home Layout: One level Home Equipment: Conservation officer, nature (2 wheels)      Prior Function Prior Level of Function : Independent/Modified Independent                     Hand Dominance   Dominant Hand: Right    Extremity/Trunk Assessment   Upper Extremity Assessment Upper Extremity  Assessment: Overall WFL for tasks assessed    Lower Extremity Assessment Lower Extremity Assessment: RLE deficits/detail RLE Deficits / Details: able to raise leg , WB, and knee flexion 70*    Cervical / Trunk Assessment Cervical / Trunk Assessment: Normal  Communication   Communication: No difficulties  Cognition Arousal/Alertness: Awake/alert Behavior During Therapy: WFL for tasks assessed/performed Overall Cognitive Status: Within Functional Limits for  tasks assessed                                          General Comments      Exercises Total Joint Exercises Ankle Circles/Pumps: AROM, Both, 10 reps Quad Sets: AROM, Both, 10 reps Heel Slides: AROM, Left, 10 reps Straight Leg Raises: AROM, Left, 10 reps   Assessment/Plan    PT Assessment All further PT needs can be met in the next venue of care  PT Problem List Decreased strength;Decreased mobility;Decreased safety awareness;Decreased range of motion;Decreased activity tolerance;Decreased knowledge of use of DME       PT Treatment Interventions      PT Goals (Current goals can be found in the Care Plan section)  Acute Rehab PT Goals Patient Stated Goal: go home PT Goal Formulation: Patient unable to participate in goal setting    Frequency       Co-evaluation               AM-PAC PT "6 Clicks" Mobility  Outcome Measure Help needed turning from your back to your side while in a flat bed without using bedrails?: A Little Help needed moving from lying on your back to sitting on the side of a flat bed without using bedrails?: A Little Help needed moving to and from a bed to a chair (including a wheelchair)?: A Little Help needed standing up from a chair using your arms (e.g., wheelchair or bedside chair)?: A Little Help needed to walk in hospital room?: A Little Help needed climbing 3-5 steps with a railing? : A Little 6 Click Score: 18    End of Session Equipment Utilized During Treatment: Gait belt Activity Tolerance: Patient tolerated treatment well Patient left: in bed;with call bell/phone within reach;with family/visitor present Nurse Communication: Mobility status PT Visit Diagnosis: Unsteadiness on feet (R26.81);Difficulty in walking, not elsewhere classified (R26.2);Pain Pain - Right/Left: Left Pain - part of body: Knee    Time: 1155-1230 PT Time Calculation (min) (ACUTE ONLY): 35 min   Charges:   PT Evaluation $PT Eval Low  Complexity: 1 Low PT Treatments $Gait Training: 8-22 mins        [place] Office [phone] Weekend [phone]   Claretha Cooper 09/09/2021, 12:52 PM

## 2021-09-09 NOTE — Op Note (Signed)
TOTAL KNEE REPLACEMENT OPERATIVE NOTE:  09/09/2021  8:53 AM  PATIENT:  Anna Bradford  88 y.o. female  PRE-OPERATIVE DIAGNOSIS:  Osteoarthritis of left knee M17.12  POST-OPERATIVE DIAGNOSIS:  Osteoarthritis of left knee M17.12  PROCEDURE:  Procedure(s): TOTAL KNEE ARTHROPLASTY  SURGEON:  Surgeon(s): Vickey Huger, MD  PHYSICIAN ASSISTANT: Carlyon Shadow, PA-C   ANESTHESIA:   spinal  SPECIMEN: None  COUNTS:  Correct  TOURNIQUET:   Total Tourniquet Time Documented: Thigh (Left) - 36 minutes Total: Thigh (Left) - 36 minutes   DICTATION:  Indication for procedure:    The patient is a 88 y.o. female who has failed conservative treatment for Osteoarthritis of left knee M17.12.  Informed consent was obtained prior to anesthesia. The risks versus benefits of the operation were explain and in a way the patient can, and did, understand.    Description of procedure:     The patient was taken to the operating room and placed under anesthesia.  The patient was positioned in the usual fashion taking care that all body parts were adequately padded and/or protected.  A tourniquet was applied and the leg prepped and draped in the usual sterile fashion.  The extremity was exsanguinated with the esmarch and tourniquet inflated to 300 mmHg.  Pre-operative range of motion was normal.    A midline incision approximately 6-7 inches long was made with a #10 blade.  A new blade was used to make a parapatellar arthrotomy going 2-3 cm into the quadriceps tendon, over the patella, and alongside the medial aspect of the patellar tendon.  A synovectomy was then performed with the #10 blade and forceps. I then elevated the deep MCL off the medial tibial metaphysis subperiosteally around to the semimembranosus attachment.    I everted the patella and used calipers to measure patellar thickness.  I used the reamer to ream down to appropriate thickness to recreate the native thickness.  I then removed excess  bone with the rongeur and sagittal saw.  I used the appropriately sized template and drilled the three lug holes.  I then put the trial in place and measured the thickness with the calipers to ensure recreation of the native thickness.  The trial was then removed and the patella subluxed and the knee brought into flexion.  A homan retractor was place to retract and protect the patella and lateral structures.  A Z-retractor was place medially to protect the medial structures.  The extra-medullary alignment system was used to make cut the tibial articular surface perpendicular to the anamotic axis of the tibia and in 3 degrees of posterior slope.  The cut surface and alignment jig was removed.  I then used the intramedullary alignment guide to make a valgus cut on the distal femur.  I then marked out the epicondylar axis on the distal femur.   then used the anterior referencing sizer and measured the femur to be a size 8.  The 4-In-1 cutting block was screwed into place in external rotation matching the posterior condylar angle, making our cuts perpendicular to the epicondylar axis.  Anterior, posterior and chamfer cuts were made with the sagittal saw.  The cutting block and cut pieces were removed.  A lamina spreader was placed in 90 degrees of flexion.  The ACL, PCL, menisci, and posterior condylar osteophytes were removed.  A 10 mm spacer blocked was found to offer good flexion and extension gap balance after minimal in degree releasing.   The scoop retractor was then placed  and the femoral finishing block was pinned in place.  The small sagittal saw was used as well as the lug drill to finish the femur.  The block and cut surfaces were removed and the medullary canal hole filled with autograft bone from the cut pieces.  The tibia was delivered forward in deep flexion and external rotation.  A size E tray was selected and pinned into place centered on the medial 1/3 of the tibial tubercle.  The reamer and  keel was used to prepare the tibia through the tray.    I then trialed with the size 8 femur, size E tibia, a 10 mm insert and the 35 patella.  I had excellent flexion/extension gap balance, excellent patella tracking.  Flexion was full and beyond 120 degrees; extension was zero.  These components were chosen and the staff opened them to me on the back table while the knee was lavaged copiously and the cement mixed.  The soft tissue was infiltrated with 60cc of exparel 1.3% through a 21 gauge needle.  I cemented in the components and removed all excess cement.  The polyethylene tibial component was snapped into place and the knee placed in extension while cement was hardening.  The capsule was infilltrated with a 60cc exparel/marcaine/saline mixture.   Once the cement was hard, the tourniquet was let down.  Hemostasis was obtained.  The arthrotomy was closed using a #1 stratofix running suture.  The deep soft tissues were closed with #0 vicryls and the subcuticular layer closed with #2-0 vicryl.  The skin was reapproximated and closed with 3.0 Monocryl.  The wound was covered with steristrips, aquacel dressing, and a TED stocking.   The patient was then awakened, extubated, and taken to the recovery room in stable condition.  BLOOD LOSS:  078ML COMPLICATIONS:  None.  PLAN OF CARE: Discharge to home after PACU  PATIENT DISPOSITION:  PACU - hemodynamically stable.   Please fax a copy of this op note to my office at [phone] (please only include page 1 and 2 of the Case Information op note)

## 2021-09-09 NOTE — Progress Notes (Signed)
Orthopedic Tech Progress Note Patient Details:  Anna Bradford [birth year] [id number]  Patient ID: Anna Bradford, female   DOB: 22-Dec-1933, 88 y.o.   MRN: [id number]  Anna Bradford 09/09/2021, 9:46 AM Left bone foam applied in pacu

## 2021-09-09 NOTE — Transfer of Care (Signed)
Immediate Anesthesia Transfer of Care Note  Patient: Anna Bradford  Procedure(s) Performed: Procedure(s): TOTAL KNEE ARTHROPLASTY (Left)  Patient Location: PACU  Anesthesia Type:Spinal  Level of Consciousness:  sedated, patient cooperative and responds to stimulation  Airway & Oxygen Therapy:Patient Spontanous Breathing and Patient connected to face mask oxgen  Post-op Assessment:  Report given to PACU RN and Post -op Vital signs reviewed and stable  Post vital signs:  Reviewed and stable  Last Vitals:  Vitals:   09/09/21 0544  BP: (!) 169/80  Pulse: 100  Resp: 18  Temp: 37.3 C  SpO2: 86%    Complications: No apparent anesthesia complications

## 2021-09-09 NOTE — Anesthesia Postprocedure Evaluation (Signed)
Anesthesia Post Note  Patient: Anna Bradford  Procedure(s) Performed: TOTAL KNEE ARTHROPLASTY (Left: Knee)     Patient location during evaluation: PACU Anesthesia Type: Spinal Level of consciousness: awake, awake and alert and oriented Pain management: pain level controlled Vital Signs Assessment: post-procedure vital signs reviewed and stable Respiratory status: spontaneous breathing, nonlabored ventilation and respiratory function stable Cardiovascular status: blood pressure returned to baseline and stable Postop Assessment: no headache, no backache, spinal receding and no apparent nausea or vomiting Anesthetic complications: no   No notable events documented.  Last Vitals:  Vitals:   09/09/21 1200 09/09/21 1215  BP: (!) 151/86 (!) 163/79  Pulse: 88   Resp: 12   Temp:    SpO2:      Last Pain:  Vitals:   09/09/21 1200  TempSrc:   PainSc: 0-No pain                 Santa Lighter

## 2021-09-09 NOTE — Anesthesia Procedure Notes (Signed)
Anesthesia Regional Block: Adductor canal block   Pre-Anesthetic Checklist: , timeout performed,  Correct Patient, Correct Site, Correct Laterality,  Correct Procedure, Correct Position, site marked,  Risks and benefits discussed,  Surgical consent,  Pre-op evaluation,  At surgeon's request and post-op pain management  Laterality: Left  Prep: chloraprep       Needles:  Injection technique: Single-shot  Needle Type: Echogenic Needle     Needle Length: 9cm  Needle Gauge: 21     Additional Needles:   Procedures:,,,, ultrasound used (permanent image in chart),,    Narrative:  Start time: 09/09/2021 6:48 AM End time: 09/09/2021 6:54 AM Injection made incrementally with aspirations every 5 mL.  Performed by: Personally  Anesthesiologist: Santa Lighter, MD  Additional Notes: No pain on injection. No increased resistance to injection. Injection made in 5cc increments.  Good needle visualization.  Patient tolerated procedure well.

## 2021-09-09 NOTE — H&P (Signed)
Anna Bradford MRN:  [id number] DOB/SEX:  [birth year]/female  CHIEF COMPLAINT:  Painful left Knee  HISTORY: Patient is a 88 y.o. female presented with a history of pain in the left knee. Onset of symptoms was gradual starting a few years ago with gradually worsening course since that time. Patient has been treated conservatively with over-the-counter NSAIDs and activity modification. Patient currently rates pain in the knee at 10 out of 10 with activity. There is pain at night.  PAST MEDICAL HISTORY: Patient Active Problem List   Diagnosis Date Noted   Submental adenopathy 10/30/2020   Lipoma of torso 09/09/2020   Osteoarthritis 09/11/2018   Mass of subcutaneous tissue of back 01/19/2018   Mass of soft tissue of abdomen 01/19/2018   Severe obesity (BMI 35.0-39.9) with comorbidity ([place]) 12/13/2017   Osteoporosis without current pathological fracture 10/01/2015   Varicose veins of lower extremities with other complications 27/74/1287   Essential hypertension, benign 08/27/2011   GERD (gastroesophageal reflux disease) 08/27/2011   Past Medical History:  Diagnosis Date   Arthritis    Cancer [facility]) 1996   left breast   Essential hypertension, benign    GERD (gastroesophageal reflux disease)    History of kidney stones    Past Surgical History:  Procedure Laterality Date   ABDOMINAL HYSTERECTOMY     Absess Off Intestine     cataract surgery Left 08/10/2011   cataract surgery Right 11/01/2011   CHOLECYSTECTOMY     1972   COLONOSCOPY N/A 02/06/2015   Procedure: COLONOSCOPY;  Surgeon: Rogene Houston, MD;  Location: [facility]ITE;  Service: Endoscopy;  Laterality: N/A;  [place]   Right ankle ORIF   GALLBLADDER SURGERY     Rupture Lower Stomach   HERNIA REPAIR     Hysterectomy ---- unknown     1980   Lapoma Tumor     Left Breast Masectomy     1986   MASS EXCISION N/A 01/30/2018   Procedure: EXCISION MASS 3CM ON BACK AND 3CM ON ABDOMEN;  Surgeon: Virl Cagey, MD;  Location: [facility] ORS;  Service: General;  Laterality: N/A;   Right Ankle Surgery       MEDICATIONS:   Facility-Administered Medications Prior to Admission  Medication Dose Route Frequency Provider Last Rate Last Admin   methylPREDNISolone acetate (DEPO-MEDROL) injection 80 mg  80 mg Intramuscular Once Dettinger, Fransisca Kaufmann, MD       Medications Prior to Admission  Medication Sig Dispense Refill Last Dose   acetaminophen (TYLENOL) 325 MG tablet Take 650 mg by mouth daily with lunch.   09/08/2021   albuterol (VENTOLIN HFA) 108 (90 Base) MCG/ACT inhaler Inhale 2 puffs into the lungs every 6 (six) hours as needed for wheezing or shortness of breath. 8 g 0    amitriptyline (ELAVIL) 50 MG tablet Take 1 tablet (50 mg total) by mouth at bedtime. 90 tablet 3 Past Month   amLODipine (NORVASC) 10 MG tablet TAKE 1 TABLET BY MOUTH DAILY 90 tablet 1 09/08/2021   Ascorbic Acid (VITAMIN C) 1000 MG tablet Take 1,000 mg by mouth daily.    09/08/2021   aspirin 81 MG tablet Take 81 mg by mouth daily.   Past Month   baclofen (LIORESAL) 10 MG tablet Take 1 tablet (10 mg total) by mouth at bedtime as needed for muscle spasms. 30 each 1 Past Week   Cholecalciferol (VITAMIN D-3) 1000 UNITS CAPS Take 2,000 Units by mouth daily.   Past  Week   diphenhydrAMINE (BENADRYL) 25 MG tablet Take 25 mg by mouth daily as needed for allergies.      famotidine (PEPCID) 20 MG tablet Take 1 tablet (20 mg total) by mouth 2 (two) times daily. 180 tablet 3 09/08/2021   ibuprofen (ADVIL,MOTRIN) 200 MG tablet Take 400 mg by mouth in the morning and at bedtime.   Past Week   losartan-hydrochlorothiazide (HYZAAR) 100-25 MG tablet TAKE 1 TABLET BY MOUTH  DAILY 90 tablet 1 09/08/2021   magnesium hydroxide (MILK OF MAGNESIA) 400 MG/5ML suspension Take 15 mLs by mouth every other day.   Past Week   Multiple Minerals-Vitamins (CALCIUM-MAGNESIUM-ZINC-D3 PO) Take 1 tablet by mouth daily.   Past Week   Multiple Vitamin (MULTIVITAMIN) tablet  Take 1 tablet by mouth daily.   Past Week   nystatin cream (MYCOSTATIN) Apply 1 application  topically daily as needed for dry skin.      Polyethyl Glycol-Propyl Glycol (SYSTANE OP) Place 1 drop into both eyes daily as needed (dry eyes).      Potassium 99 MG TABS Take 99 mg by mouth daily.   09/08/2021   rOPINIRole (REQUIP) 2 MG tablet Take 1 tablet (2 mg total) by mouth at bedtime. For leg cramps (Patient taking differently: Take 2 mg by mouth at bedtime as needed (leg cramps).) 30 tablet 2 Past Week   triamcinolone cream (KENALOG) 0.1 % Apply 1 application topically 2 (two) times daily. (Patient taking differently: Apply 1 application  topically daily as needed (irritation/itching).) 80 g 0    zinc gluconate 50 MG tablet Take 50 mg by mouth daily.   09/08/2021   clobetasol cream (TEMOVATE) 0.05 % Apply topically 2 (two) times daily. (Patient not taking: Reported on 08/26/2021) 30 g 0 Not Taking   predniSONE (DELTASONE) 20 MG tablet 2 po at same time daily for 5 days (Patient not taking: Reported on 08/26/2021) 10 tablet 0 Not Taking    ALLERGIES:   Allergies  Allergen Reactions   Levofloxacin     Other (See Comments) tendonitis   Nitrofurantoin     Other reaction(s): Other (See Comments) Bleeding thru bowels   Macrolides And Ketolides     Bleeding thru bowels   Codeine Nausea And Vomiting   Sulfa Antibiotics Nausea And Vomiting    REVIEW OF SYSTEMS:  A comprehensive review of systems was negative except for: Musculoskeletal: positive for arthralgias and bone pain   FAMILY HISTORY:   Family History  Problem Relation Age of Onset   Cancer Mother    Coronary artery disease Mother    Heart disease Sister     SOCIAL HISTORY:   Social History   Tobacco Use   Smoking status: Never   Smokeless tobacco: Never  Substance Use Topics   Alcohol use: No     EXAMINATION:  Vital signs in last 24 hours: Temp:  [99.2 F (37.3 C)] 99.2 F (37.3 C) (08/23 0544) Pulse Rate:  [100] 100  (08/23 0544) Resp:  [18] 18 (08/23 0544) BP: (169)/(80) 169/80 (08/23 0544) SpO2:  [97 %] 97 % (08/23 0544) Weight:  [101.6 kg] 101.6 kg (08/23 0629)  BP (!) 169/80   Pulse 100   Temp 99.2 F (37.3 C) (Oral)   Resp 18   Wt 101.6 kg   SpO2 97%   BMI 38.45 kg/m   General Appearance:    Alert, cooperative, no distress, appears stated age  Head:    Normocephalic, without obvious abnormality, atraumatic  Eyes:  PERRL, conjunctiva/corneas clear, EOM's intact, fundi    benign, both eyes  Ears:    Normal TM's and external ear canals, both ears  Nose:   Nares normal, septum midline, mucosa normal, no drainage    or sinus tenderness  Throat:   Lips, mucosa, and tongue normal; teeth and gums normal  Neck:   Supple, symmetrical, trachea midline, no adenopathy;    thyroid:  no enlargement/tenderness/nodules; no carotid   bruit or JVD  Back:     Symmetric, no curvature, ROM normal, no CVA tenderness  Lungs:     Clear to auscultation bilaterally, respirations unlabored  Chest Wall:    No tenderness or deformity   Heart:    Regular rate and rhythm, S1 and S2 normal, no murmur, rub   or gallop  Breast Exam:    No tenderness, masses, or nipple abnormality  Abdomen:     Soft, non-tender, bowel sounds active all four quadrants,    no masses, no organomegaly  Genitalia:    Normal female without lesion, discharge or tenderness  Rectal:    Normal tone, no masses or tenderness;   guaiac negative stool  Extremities:   Extremities normal, atraumatic, no cyanosis or edema  Pulses:   2+ and symmetric all extremities  Skin:   Skin color, texture, turgor normal, no rashes or lesions  Lymph nodes:   Cervical, supraclavicular, and axillary nodes normal  Neurologic:   CNII-XII intact, normal strength, sensation and reflexes    throughout    Musculoskeletal:  ROM 0-120, Ligaments intact,  Imaging Review Plain radiographs demonstrate severe degenerative joint disease of the left knee. The overall  alignment is neutral. The bone quality appears to be good for age and reported activity level.  Assessment/Plan: Primary osteoarthritis, left knee   The patient history, physical examination and imaging studies are consistent with advanced degenerative joint disease of the left knee. The patient has failed conservative treatment.  The clearance notes were reviewed.  After discussion with the patient it was felt that Total Knee Replacement was indicated. The procedure,  risks, and benefits of total knee arthroplasty were presented and reviewed. The risks including but not limited to aseptic loosening, infection, blood clots, vascular injury, stiffness, patella tracking problems complications among others were discussed. The patient acknowledged the explanation, agreed to proceed with the plan.  Preoperative templating of the joint replacement has been completed, documented, and submitted to the Operating Room personnel in order to optimize intra-operative equipment management.    Patient's anticipated LOS is less than 2 midnights, meeting these requirements: - Lives within 1 hour of care - Has a competent adult at home to recover with post-op recover - NO history of  - Chronic pain requiring opiods  - Diabetes  - Coronary Artery Disease  - Heart failure  - Heart attack  - Stroke  - DVT/VTE  - Cardiac arrhythmia  - Respiratory Failure/COPD  - Renal failure  - Anemia  - Advanced Liver disease     Donia Ast 09/09/2021, 6:53 AM

## 2021-09-09 NOTE — Discharge Summary (Signed)
SPORTS MEDICINE & JOINT REPLACEMENT   Lara Mulch, MD   Carlyon Shadow, PA-C [address], [place], [place]  [id number]                             [phone]  PATIENT ID: Anna Bradford        MRN:  [id number]          DOB/AGE: Sep 08, 1933 / 88 y.o.    DISCHARGE SUMMARY  ADMISSION DATE:    09/09/2021 DISCHARGE DATE:   09/09/2021   ADMISSION DIAGNOSIS: Osteoarthritis of left knee M17.12    DISCHARGE DIAGNOSIS:  Osteoarthritis of left knee M17.12    ADDITIONAL DIAGNOSIS: Active Problems:   * No active hospital problems. *  Past Medical History:  Diagnosis Date   Arthritis    Cancer ([place]) 1996   left breast   Essential hypertension, benign    GERD (gastroesophageal reflux disease)    History of kidney stones     PROCEDURE: Procedure(s): TOTAL KNEE ARTHROPLASTY on 09/09/2021  CONSULTS:    HISTORY:  See H&P in chart  HOSPITAL COURSE:  GEANETTE BUONOCORE is a 87 y.o. admitted on 09/09/2021 and found to have a diagnosis of Osteoarthritis of left knee M17.12.  After appropriate laboratory studies were obtained  they were taken to the operating room on 09/09/2021 and underwent Procedure(s): TOTAL KNEE ARTHROPLASTY.   They were given perioperative antibiotics:  Anti-infectives (From admission, onward)    Start     Dose/Rate Route Frequency Ordered Stop   09/09/21 0600  vancomycin (VANCOCIN) IVPB 1000 mg/200 mL premix        1,000 mg 200 mL/hr over 60 Minutes Intravenous  Once 09/09/21 0530 09/09/21 0748   09/09/21 0600  ceFAZolin (ANCEF) IVPB 2g/100 mL premix        2 g 200 mL/hr over 30 Minutes Intravenous On call to O.R. 09/09/21 0530 09/09/21 0732     .  Patient given tranexamic acid IV or topical and exparel intra-operatively.  Tolerated the procedure well.    POD# 1: Vital signs were stable.  Patient denied Chest pain, shortness of breath, or calf pain.  Patient was started on Aspirin twice daily at 8am.  Consults to PT, OT, and care management were made.  The  patient was weight bearing as tolerated.  CPM was placed on the operative leg 0-90 degrees for 6-8 hours a day. When out of the CPM, patient was placed in the foam block to achieve full extension. Incentive spirometry was taught.  Dressing was changed.       POD #2, Continued  PT for ambulation and exercise program.  IV saline locked.  O2 discontinued.    The remainder of the hospital course was dedicated to ambulation and strengthening.   The patient was discharged on Day of Surgery in  Good condition.  Blood products given:none  DIAGNOSTIC STUDIES: Recent vital signs: Patient Vitals for the past 24 hrs:  BP Temp Temp src Pulse Resp SpO2 Weight  09/09/21 1215 (!) 163/79 -- -- -- -- -- --  09/09/21 1200 (!) 151/86 -- -- 88 12 -- --  09/09/21 1100 (!) 162/95 -- -- 87 13 100 % --  09/09/21 1000 (!) 171/93 -- -- -- 19 100 % --  09/09/21 0945 (!) 162/92 -- -- -- 11 -- --  09/09/21 0930 (!) 148/78 -- -- 86 14 100 % --  09/09/21 0915 (!) 146/84 -- -- --  12 -- --  09/09/21 0900 (!) 141/73 -- -- -- 14 -- --  09/09/21 0850 128/62 (!) 97.5 F (36.4 C) -- 88 15 100 % --  09/09/21 0629 -- -- -- -- -- -- 101.6 kg  09/09/21 0544 (!) 169/80 99.2 F (37.3 C) Oral 100 18 97 % --       Recent laboratory studies: No results for input(s): "WBC", "HGB", "HCT", "PLT" in the last 168 hours. No results for input(s): "NA", "K", "CL", "CO2", "BUN", "CREATININE", "GLUCOSE", "CALCIUM" in the last 168 hours. Lab Results  Component Value Date   INR 1.0 06/18/2021     Recent Radiographic Studies :  No results found.  DISCHARGE INSTRUCTIONS: Discharge Instructions     Call MD / Call 911   Complete by: As directed    If you experience chest pain or shortness of breath, CALL 911 and be transported to the hospital emergency room.  If you develope a fever above 101 F, pus (white drainage) or increased drainage or redness at the wound, or calf pain, call your surgeon's office.   Call MD / Call 911   Complete  by: As directed    If you experience chest pain or shortness of breath, CALL 911 and be transported to the hospital emergency room.  If you develope a fever above 101 F, pus (white drainage) or increased drainage or redness at the wound, or calf pain, call your surgeon's office.   Constipation Prevention   Complete by: As directed    Drink plenty of fluids.  Prune juice may be helpful.  You may use a stool softener, such as Colace (over the counter) 100 mg twice a day.  Use MiraLax (over the counter) for constipation as needed.   Constipation Prevention   Complete by: As directed    Drink plenty of fluids.  Prune juice may be helpful.  You may use a stool softener, such as Colace (over the counter) 100 mg twice a day.  Use MiraLax (over the counter) for constipation as needed.   Diet - low sodium heart healthy   Complete by: As directed    Diet - low sodium heart healthy   Complete by: As directed    Discharge instructions   Complete by: As directed    INSTRUCTIONS AFTER JOINT REPLACEMENT   Remove items at home which could result in a fall. This includes throw rugs or furniture in walking pathways ICE to the affected joint every three hours while awake for 30 minutes at a time, for at least the first 3-5 days, and then as needed for pain and swelling.  Continue to use ice for pain and swelling. You may notice swelling that will progress down to the foot and ankle.  This is normal after surgery.  Elevate your leg when you are not up walking on it.   Continue to use the breathing machine you got in the hospital (incentive spirometer) which will help keep your temperature down.  It is common for your temperature to cycle up and down following surgery, especially at night when you are not up moving around and exerting yourself.  The breathing machine keeps your lungs expanded and your temperature down.   DIET:  As you were doing prior to hospitalization, we recommend a well-balanced  diet.  DRESSING / WOUND CARE / SHOWERING  Keep the surgical dressing until follow up.  The dressing is water proof, so you can shower without any extra covering.  IF THE  DRESSING FALLS OFF or the wound gets wet inside, change the dressing with sterile gauze.  Please use good hand washing techniques before changing the dressing.  Do not use any lotions or creams on the incision until instructed by your surgeon.    ACTIVITY  Increase activity slowly as tolerated, but follow the weight bearing instructions below.   No driving for 6 weeks or until further direction given by your physician.  You cannot drive while taking narcotics.  No lifting or carrying greater than 10 lbs. until further directed by your surgeon. Avoid periods of inactivity such as sitting longer than an hour when not asleep. This helps prevent blood clots.  You may return to work once you are authorized by your doctor.     WEIGHT BEARING   Weight bearing as tolerated with assist device (walker, cane, etc) as directed, use it as long as suggested by your surgeon or therapist, typically at least 4-6 weeks.   EXERCISES  Results after joint replacement surgery are often greatly improved when you follow the exercise, range of motion and muscle strengthening exercises prescribed by your doctor. Safety measures are also important to protect the joint from further injury. Any time any of these exercises cause you to have increased pain or swelling, decrease what you are doing until you are comfortable again and then slowly increase them. If you have problems or questions, call your caregiver or physical therapist for advice.   Rehabilitation is important following a joint replacement. After just a few days of immobilization, the muscles of the leg can become weakened and shrink (atrophy).  These exercises are designed to build up the tone and strength of the thigh and leg muscles and to improve motion. Often times heat used for twenty  to thirty minutes before working out will loosen up your tissues and help with improving the range of motion but do not use heat for the first two weeks following surgery (sometimes heat can increase post-operative swelling).   These exercises can be done on a training (exercise) mat, on the floor, on a table or on a bed. Use whatever works the best and is most comfortable for you.    Use music or television while you are exercising so that the exercises are a pleasant break in your day. This will make your life better with the exercises acting as a break in your routine that you can look forward to.   Perform all exercises about fifteen times, three times per day or as directed.  You should exercise both the operative leg and the other leg as well.  Exercises include:   Quad Sets - Tighten up the muscle on the front of the thigh (Quad) and hold for 5-10 seconds.   Straight Leg Raises - With your knee straight (if you were given a brace, keep it on), lift the leg to 60 degrees, hold for 3 seconds, and slowly lower the leg.  Perform this exercise against resistance later as your leg gets stronger.  Leg Slides: Lying on your back, slowly slide your foot toward your buttocks, bending your knee up off the floor (only go as far as is comfortable). Then slowly slide your foot back down until your leg is flat on the floor again.  Angel Wings: Lying on your back spread your legs to the side as far apart as you can without causing discomfort.  Hamstring Strength:  Lying on your back, push your heel against the floor with your leg  straight by tightening up the muscles of your buttocks.  Repeat, but this time bend your knee to a comfortable angle, and push your heel against the floor.  You may put a pillow under the heel to make it more comfortable if necessary.   A rehabilitation program following joint replacement surgery can speed recovery and prevent re-injury in the future due to weakened muscles. Contact your  doctor or a physical therapist for more information on knee rehabilitation.    CONSTIPATION  Constipation is defined medically as fewer than three stools per week and severe constipation as less than one stool per week.  Even if you have a regular bowel pattern at home, your normal regimen is likely to be disrupted due to multiple reasons following surgery.  Combination of anesthesia, postoperative narcotics, change in appetite and fluid intake all can affect your bowels.   YOU MUST use at least one of the following options; they are listed in order of increasing strength to get the job done.  They are all available over the counter, and you may need to use some, POSSIBLY even all of these options:    Drink plenty of fluids (prune juice may be helpful) and high fiber foods Colace 100 mg by mouth twice a day  Senokot for constipation as directed and as needed Dulcolax (bisacodyl), take with full glass of water  Miralax (polyethylene glycol) once or twice a day as needed.  If you have tried all these things and are unable to have a bowel movement in the first 3-4 days after surgery call either your surgeon or your primary doctor.    If you experience loose stools or diarrhea, hold the medications until you stool forms back up.  If your symptoms do not get better within 1 week or if they get worse, check with your doctor.  If you experience "the worst abdominal pain ever" or develop nausea or vomiting, please contact the office immediately for further recommendations for treatment.   ITCHING:  If you experience itching with your medications, try taking only a single pain pill, or even half a pain pill at a time.  You can also use Benadryl over the counter for itching or also to help with sleep.   TED HOSE STOCKINGS:  Use stockings on both legs until for at least 2 weeks or as directed by physician office. They may be removed at night for sleeping.  MEDICATIONS:  See your medication summary on the  "After Visit Summary" that nursing will review with you.  You may have some home medications which will be placed on hold until you complete the course of blood thinner medication.  It is important for you to complete the blood thinner medication as prescribed.  PRECAUTIONS:  If you experience chest pain or shortness of breath - call 911 immediately for transfer to the hospital emergency department.   If you develop a fever greater that 101 F, purulent drainage from wound, increased redness or drainage from wound, foul odor from the wound/dressing, or calf pain - CONTACT YOUR SURGEON.                                                   FOLLOW-UP APPOINTMENTS:  If you do not already have a post-op appointment, please call the office for an appointment to be seen by your  Psychologist, sport and exercise.  Guidelines for how soon to be seen are listed in your "After Visit Summary", but are typically between 1-4 weeks after surgery.  OTHER INSTRUCTIONS:   Knee Replacement:  Do not place pillow under knee, focus on keeping the knee straight while resting. CPM instructions: 0-90 degrees, 2 hours in the morning, 2 hours in the afternoon, and 2 hours in the evening. Place foam block, curve side up under heel at all times except when in CPM or when walking.  DO NOT modify, tear, cut, or change the foam block in any way.  POST-OPERATIVE OPIOID TAPER INSTRUCTIONS: It is important to wean off of your opioid medication as soon as possible. If you do not need pain medication after your surgery it is ok to stop day one. Opioids include: Codeine, Hydrocodone(Norco, Vicodin), Oxycodone(Percocet, oxycontin) and hydromorphone amongst others.  Long term and even short term use of opiods can cause: Increased pain response Dependence Constipation Depression Respiratory depression And more.  Withdrawal symptoms can include Flu like symptoms Nausea, vomiting And more Techniques to manage these symptoms Hydrate well Eat regular healthy  meals Stay active Use relaxation techniques(deep breathing, meditating, yoga) Do Not substitute Alcohol to help with tapering If you have been on opioids for less than two weeks and do not have pain than it is ok to stop all together.  Plan to wean off of opioids This plan should start within one week post op of your joint replacement. Maintain the same interval or time between taking each dose and first decrease the dose.  Cut the total daily intake of opioids by one tablet each day Next start to increase the time between doses. The last dose that should be eliminated is the evening dose.     MAKE SURE YOU:  Understand these instructions.  Get help right away if you are not doing well or get worse.    Thank you for letting us be a part of your medical care team.  It is a privilege we respect greatly.  We hope these instructions will help you stay on track for a fast and full recovery!   Discharge instructions   Complete by: As directed    INSTRUCTIONS AFTER JOINT REPLACEMENT   Remove items at home which could result in a fall. This includes throw rugs or furniture in walking pathways ICE to the affected joint every three hours while awake for 30 minutes at a time, for at least the first 3-5 days, and then as needed for pain and swelling.  Continue to use ice for pain and swelling. You may notice swelling that will progress down to the foot and ankle.  This is normal after surgery.  Elevate your leg when you are not up walking on it.   Continue to use the breathing machine you got in the hospital (incentive spirometer) which will help keep your temperature down.  It is common for your temperature to cycle up and down following surgery, especially at night when you are not up moving around and exerting yourself.  The breathing machine keeps your lungs expanded and your temperature down.   DIET:  As you were doing prior to hospitalization, we recommend a well-balanced diet.  DRESSING /  WOUND CARE / SHOWERING  Keep the surgical dressing until follow up.  The dressing is water proof, so you can shower without any extra covering.  IF THE DRESSING FALLS OFF or the wound gets wet inside, change the dressing with sterile gauze.  Please  use good hand washing techniques before changing the dressing.  Do not use any lotions or creams on the incision until instructed by your surgeon.    ACTIVITY  Increase activity slowly as tolerated, but follow the weight bearing instructions below.   No driving for 6 weeks or until further direction given by your physician.  You cannot drive while taking narcotics.  No lifting or carrying greater than 10 lbs. until further directed by your surgeon. Avoid periods of inactivity such as sitting longer than an hour when not asleep. This helps prevent blood clots.  You may return to work once you are authorized by your doctor.     WEIGHT BEARING   Weight bearing as tolerated with assist device (walker, cane, etc) as directed, use it as long as suggested by your surgeon or therapist, typically at least 4-6 weeks.   EXERCISES  Results after joint replacement surgery are often greatly improved when you follow the exercise, range of motion and muscle strengthening exercises prescribed by your doctor. Safety measures are also important to protect the joint from further injury. Any time any of these exercises cause you to have increased pain or swelling, decrease what you are doing until you are comfortable again and then slowly increase them. If you have problems or questions, call your caregiver or physical therapist for advice.   Rehabilitation is important following a joint replacement. After just a few days of immobilization, the muscles of the leg can become weakened and shrink (atrophy).  These exercises are designed to build up the tone and strength of the thigh and leg muscles and to improve motion. Often times heat used for twenty to thirty minutes  before working out will loosen up your tissues and help with improving the range of motion but do not use heat for the first two weeks following surgery (sometimes heat can increase post-operative swelling).   These exercises can be done on a training (exercise) mat, on the floor, on a table or on a bed. Use whatever works the best and is most comfortable for you.    Use music or television while you are exercising so that the exercises are a pleasant break in your day. This will make your life better with the exercises acting as a break in your routine that you can look forward to.   Perform all exercises about fifteen times, three times per day or as directed.  You should exercise both the operative leg and the other leg as well.  Exercises include:   Quad Sets - Tighten up the muscle on the front of the thigh (Quad) and hold for 5-10 seconds.   Straight Leg Raises - With your knee straight (if you were given a brace, keep it on), lift the leg to 60 degrees, hold for 3 seconds, and slowly lower the leg.  Perform this exercise against resistance later as your leg gets stronger.  Leg Slides: Lying on your back, slowly slide your foot toward your buttocks, bending your knee up off the floor (only go as far as is comfortable). Then slowly slide your foot back down until your leg is flat on the floor again.  Angel Wings: Lying on your back spread your legs to the side as far apart as you can without causing discomfort.  Hamstring Strength:  Lying on your back, push your heel against the floor with your leg straight by tightening up the muscles of your buttocks.  Repeat, but this time bend your knee  to a comfortable angle, and push your heel against the floor.  You may put a pillow under the heel to make it more comfortable if necessary.   A rehabilitation program following joint replacement surgery can speed recovery and prevent re-injury in the future due to weakened muscles. Contact your doctor or a  physical therapist for more information on knee rehabilitation.    CONSTIPATION  Constipation is defined medically as fewer than three stools per week and severe constipation as less than one stool per week.  Even if you have a regular bowel pattern at home, your normal regimen is likely to be disrupted due to multiple reasons following surgery.  Combination of anesthesia, postoperative narcotics, change in appetite and fluid intake all can affect your bowels.   YOU MUST use at least one of the following options; they are listed in order of increasing strength to get the job done.  They are all available over the counter, and you may need to use some, POSSIBLY even all of these options:    Drink plenty of fluids (prune juice may be helpful) and high fiber foods Colace 100 mg by mouth twice a day  Senokot for constipation as directed and as needed Dulcolax (bisacodyl), take with full glass of water  Miralax (polyethylene glycol) once or twice a day as needed.  If you have tried all these things and are unable to have a bowel movement in the first 3-4 days after surgery call either your surgeon or your primary doctor.    If you experience loose stools or diarrhea, hold the medications until you stool forms back up.  If your symptoms do not get better within 1 week or if they get worse, check with your doctor.  If you experience "the worst abdominal pain ever" or develop nausea or vomiting, please contact the office immediately for further recommendations for treatment.   ITCHING:  If you experience itching with your medications, try taking only a single pain pill, or even half a pain pill at a time.  You can also use Benadryl over the counter for itching or also to help with sleep.   TED HOSE STOCKINGS:  Use stockings on both legs until for at least 2 weeks or as directed by physician office. They may be removed at night for sleeping.  MEDICATIONS:  See your medication summary on the "After  Visit Summary" that nursing will review with you.  You may have some home medications which will be placed on hold until you complete the course of blood thinner medication.  It is important for you to complete the blood thinner medication as prescribed.  PRECAUTIONS:  If you experience chest pain or shortness of breath - call 911 immediately for transfer to the hospital emergency department.   If you develop a fever greater that 101 F, purulent drainage from wound, increased redness or drainage from wound, foul odor from the wound/dressing, or calf pain - CONTACT YOUR SURGEON.                                                   FOLLOW-UP APPOINTMENTS:  If you do not already have a post-op appointment, please call the office for an appointment to be seen by your surgeon.  Guidelines for how soon to be seen are listed in your "After Visit Summary", but  are typically between 1-4 weeks after surgery.  OTHER INSTRUCTIONS:   Knee Replacement:  Do not place pillow under knee, focus on keeping the knee straight while resting. CPM instructions: 0-90 degrees, 2 hours in the morning, 2 hours in the afternoon, and 2 hours in the evening. Place foam block, curve side up under heel at all times except when in CPM or when walking.  DO NOT modify, tear, cut, or change the foam block in any way.  POST-OPERATIVE OPIOID TAPER INSTRUCTIONS: It is important to wean off of your opioid medication as soon as possible. If you do not need pain medication after your surgery it is ok to stop day one. Opioids include: Codeine, Hydrocodone(Norco, Vicodin), Oxycodone(Percocet, oxycontin) and hydromorphone amongst others.  Long term and even short term use of opiods can cause: Increased pain response Dependence Constipation Depression Respiratory depression And more.  Withdrawal symptoms can include Flu like symptoms Nausea, vomiting And more Techniques to manage these symptoms Hydrate well Eat regular healthy  meals Stay active Use relaxation techniques(deep breathing, meditating, yoga) Do Not substitute Alcohol to help with tapering If you have been on opioids for less than two weeks and do not have pain than it is ok to stop all together.  Plan to wean off of opioids This plan should start within one week post op of your joint replacement. Maintain the same interval or time between taking each dose and first decrease the dose.  Cut the total daily intake of opioids by one tablet each day Next start to increase the time between doses. The last dose that should be eliminated is the evening dose.     MAKE SURE YOU:  Understand these instructions.  Get help right away if you are not doing well or get worse.    Thank you for letting us be a part of your medical care team.  It is a privilege we respect greatly.  We hope these instructions will help you stay on track for a fast and full recovery!   Increase activity slowly as tolerated   Complete by: As directed    Increase activity slowly as tolerated   Complete by: As directed    Post-operative opioid taper instructions:   Complete by: As directed    POST-OPERATIVE OPIOID TAPER INSTRUCTIONS: It is important to wean off of your opioid medication as soon as possible. If you do not need pain medication after your surgery it is ok to stop day one. Opioids include: Codeine, Hydrocodone(Norco, Vicodin), Oxycodone(Percocet, oxycontin) and hydromorphone amongst others.  Long term and even short term use of opiods can cause: Increased pain response Dependence Constipation Depression Respiratory depression And more.  Withdrawal symptoms can include Flu like symptoms Nausea, vomiting And more Techniques to manage these symptoms Hydrate well Eat regular healthy meals Stay active Use relaxation techniques(deep breathing, meditating, yoga) Do Not substitute Alcohol to help with tapering If you have been on opioids for less than two weeks and  do not have pain than it is ok to stop all together.  Plan to wean off of opioids This plan should start within one week post op of your joint replacement. Maintain the same interval or time between taking each dose and first decrease the dose.  Cut the total daily intake of opioids by one tablet each day Next start to increase the time between doses. The last dose that should be eliminated is the evening dose.      Post-operative opioid taper instructions:  Complete by: As directed    POST-OPERATIVE OPIOID TAPER INSTRUCTIONS: It is important to wean off of your opioid medication as soon as possible. If you do not need pain medication after your surgery it is ok to stop day one. Opioids include: Codeine, Hydrocodone(Norco, Vicodin), Oxycodone(Percocet, oxycontin) and hydromorphone amongst others.  Long term and even short term use of opiods can cause: Increased pain response Dependence Constipation Depression Respiratory depression And more.  Withdrawal symptoms can include Flu like symptoms Nausea, vomiting And more Techniques to manage these symptoms Hydrate well Eat regular healthy meals Stay active Use relaxation techniques(deep breathing, meditating, yoga) Do Not substitute Alcohol to help with tapering If you have been on opioids for less than two weeks and do not have pain than it is ok to stop all together.  Plan to wean off of opioids This plan should start within one week post op of your joint replacement. Maintain the same interval or time between taking each dose and first decrease the dose.  Cut the total daily intake of opioids by one tablet each day Next start to increase the time between doses. The last dose that should be eliminated is the evening dose.          DISCHARGE MEDICATIONS:   Allergies as of 09/09/2021       Reactions   Levofloxacin    Other (See Comments) tendonitis   Nitrofurantoin    Other reaction(s): Other (See Comments) Bleeding  thru bowels   Macrolides And Ketolides    Bleeding thru bowels   Codeine Nausea And Vomiting   Sulfa Antibiotics Nausea And Vomiting        Medication List     STOP taking these medications    aspirin 81 MG tablet Replaced by: aspirin EC 81 MG tablet   ibuprofen 200 MG tablet Commonly known as: ADVIL   predniSONE 20 MG tablet Commonly known as: DELTASONE       TAKE these medications    acetaminophen 325 MG tablet Commonly known as: TYLENOL Take 650 mg by mouth daily with lunch. What changed: Another medication with the same name was added. Make sure you understand how and when to take each.   acetaminophen 500 MG tablet Commonly known as: TYLENOL Take 2 tablets (1,000 mg total) by mouth every 6 (six) hours. What changed: You were already taking a medication with the same name, and this prescription was added. Make sure you understand how and when to take each.   albuterol 108 (90 Base) MCG/ACT inhaler Commonly known as: VENTOLIN HFA Inhale 2 puffs into the lungs every 6 (six) hours as needed for wheezing or shortness of breath.   amitriptyline 50 MG tablet Commonly known as: ELAVIL Take 1 tablet (50 mg total) by mouth at bedtime.   amLODipine 10 MG tablet Commonly known as: NORVASC TAKE 1 TABLET BY MOUTH DAILY   aspirin EC 81 MG tablet Take 1 tablet (81 mg total) by mouth 2 (two) times daily. Swallow whole. Replaces: aspirin 81 MG tablet   baclofen 10 MG tablet Commonly known as: LIORESAL Take 1 tablet (10 mg total) by mouth at bedtime as needed for muscle spasms.   CALCIUM-MAGNESIUM-ZINC-D3 PO Take 1 tablet by mouth daily.   clobetasol cream 0.05 % Commonly known as: TEMOVATE Apply topically 2 (two) times daily.   diphenhydrAMINE 25 MG tablet Commonly known as: BENADRYL Take 25 mg by mouth daily as needed for allergies.   famotidine 20 MG tablet Commonly known as: Pepcid  Take 1 tablet (20 mg total) by mouth 2 (two) times daily.    losartan-hydrochlorothiazide 100-25 MG tablet Commonly known as: HYZAAR TAKE 1 TABLET BY MOUTH  DAILY   magnesium hydroxide 400 MG/5ML suspension Commonly known as: MILK OF MAGNESIA Take 15 mLs by mouth every other day.   multivitamin tablet Take 1 tablet by mouth daily.   nystatin cream Commonly known as: MYCOSTATIN Apply 1 application  topically daily as needed for dry skin.   oxyCODONE 5 MG immediate release tablet Commonly known as: Roxicodone Take 1-2 tablets (5-10 mg total) by mouth every 6 (six) hours as needed.   Potassium 99 MG Tabs Take 99 mg by mouth daily.   rOPINIRole 2 MG tablet Commonly known as: REQUIP Take 1 tablet (2 mg total) by mouth at bedtime. For leg cramps What changed:  when to take this reasons to take this additional instructions   SYSTANE OP Place 1 drop into both eyes daily as needed (dry eyes).   triamcinolone cream 0.1 % Commonly known as: KENALOG Apply 1 application topically 2 (two) times daily. What changed:  when to take this reasons to take this   vitamin C 1000 MG tablet Take 1,000 mg by mouth daily.   Vitamin D-3 25 MCG (1000 UT) Caps Take 2,000 Units by mouth daily.   zinc gluconate 50 MG tablet Take 50 mg by mouth daily.        FOLLOW UP VISIT:    DISPOSITION: HOME VS. SNF  Dental Antibiotics:  In most cases prophylactic antibiotics for Dental procdeures after total joint surgery are not necessary.  Exceptions are as follows:  1. History of prior total joint infection  2. Severely immunocompromised (Organ Transplant, cancer chemotherapy, Rheumatoid biologic meds such as [place])  3. Poorly controlled diabetes (A1C &gt; 8.0, blood glucose over 200)  If you have one of these conditions, contact your surgeon for an antibiotic prescription, prior to your dental procedure.   CONDITION:  Good   Donia Ast 09/09/2021, 2:00 PM

## 2021-09-09 NOTE — Anesthesia Procedure Notes (Signed)
Spinal  Patient location during procedure: OR Start time: 09/09/2021 7:22 AM End time: 09/09/2021 7:25 AM Reason for block: surgical anesthesia Staffing Performed: anesthesiologist  Anesthesiologist: Santa Lighter, MD Performed by: Santa Lighter, MD Authorized by: Santa Lighter, MD   Preanesthetic Checklist Completed: patient identified, IV checked, risks and benefits discussed, surgical consent, monitors and equipment checked, pre-op evaluation and timeout performed Spinal Block Patient position: sitting Prep: DuraPrep and site prepped and draped Patient monitoring: continuous pulse ox and blood pressure Approach: midline Location: L3-4 Injection technique: single-shot Needle Needle type: Pencan  Needle gauge: 24 G Assessment Events: CSF return Additional Notes Functioning IV was confirmed and monitors were applied. Sterile prep and drape, including hand hygiene, mask and sterile gloves were used. The patient was positioned and the spine was prepped. The skin was anesthetized with lidocaine.  Free flow of clear CSF was obtained prior to injecting local anesthetic into the CSF.  The spinal needle aspirated freely following injection.  The needle was carefully withdrawn.  The patient tolerated the procedure well. Consent was obtained prior to procedure with all questions answered and concerns addressed. Risks including but not limited to bleeding, infection, nerve damage, paralysis, failed block, inadequate analgesia, allergic reaction, high spinal, itching and headache were discussed and the patient wished to proceed.   Hoy Morn, MD

## 2021-09-10 ENCOUNTER — Encounter (HOSPITAL_COMMUNITY): Payer: Self-pay | Admitting: Orthopedic Surgery

## 2021-09-17 ENCOUNTER — Encounter: Payer: Self-pay | Admitting: Physical Therapy

## 2021-09-17 ENCOUNTER — Other Ambulatory Visit: Payer: Self-pay

## 2021-09-17 ENCOUNTER — Ambulatory Visit: Payer: Medicare Other | Attending: Orthopedic Surgery | Admitting: Physical Therapy

## 2021-09-17 DIAGNOSIS — R600 Localized edema: Secondary | ICD-10-CM | POA: Diagnosis not present

## 2021-09-17 DIAGNOSIS — M6281 Muscle weakness (generalized): Secondary | ICD-10-CM | POA: Diagnosis not present

## 2021-09-17 DIAGNOSIS — M25662 Stiffness of left knee, not elsewhere classified: Secondary | ICD-10-CM | POA: Insufficient documentation

## 2021-09-17 DIAGNOSIS — M25562 Pain in left knee: Secondary | ICD-10-CM | POA: Insufficient documentation

## 2021-09-17 DIAGNOSIS — Z96652 Presence of left artificial knee joint: Secondary | ICD-10-CM | POA: Diagnosis not present

## 2021-09-17 DIAGNOSIS — G8929 Other chronic pain: Secondary | ICD-10-CM | POA: Diagnosis not present

## 2021-09-17 NOTE — Therapy (Signed)
OUTPATIENT PHYSICAL THERAPY LOWER EXTREMITY EVALUATION   Patient Name: Anna Bradford MRN: [id number] DOB:Dec 16, 1933, 88 y.o., female Today's Date: 09/17/2021   PT End of Session - 09/17/21 1333     Visit Number 1    Number of Visits 12    Date for PT Re-Evaluation 12/16/21    PT Start Time 5784    PT Stop Time 1333    PT Time Calculation (min) 37 min    Activity Tolerance Patient tolerated treatment well             Past Medical History:  Diagnosis Date   Arthritis    Cancer ([place]) 1996   left breast   Essential hypertension, benign    GERD (gastroesophageal reflux disease)    History of kidney stones    Past Surgical History:  Procedure Laterality Date   ABDOMINAL HYSTERECTOMY     Absess Off Intestine     cataract surgery Left 08/10/2011   cataract surgery Right 11/01/2011   CHOLECYSTECTOMY     1972   COLONOSCOPY N/A 02/06/2015   Procedure: COLONOSCOPY;  Surgeon: Rogene Houston, MD;  [facility];  Service: Endoscopy;  Laterality: N/A;  [place]   Right ankle ORIF   GALLBLADDER SURGERY     Rupture Lower Stomach   HERNIA REPAIR     Hysterectomy ---- unknown     1980   Lapoma Tumor     Left Breast Masectomy     1986   MASS EXCISION N/A 01/30/2018   Procedure: EXCISION MASS 3CM ON BACK AND 3CM ON ABDOMEN;  Surgeon: Virl Cagey, MD;  Location: [facility];  Service: General;  Laterality: N/A;   Right Ankle Surgery     TOTAL KNEE ARTHROPLASTY Left 09/09/2021   Procedure: TOTAL KNEE ARTHROPLASTY;  Surgeon: Vickey Huger, MD;  Location: [facility];  Service: Orthopedics;  Laterality: Left;   Patient Active Problem List   Diagnosis Date Noted   Submental adenopathy 10/30/2020   Lipoma of torso 09/09/2020   Osteoarthritis 09/11/2018   Mass of subcutaneous tissue of back 01/19/2018   Mass of soft tissue of abdomen 01/19/2018   Severe obesity (BMI 35.0-39.9) with comorbidity ([place]) 12/13/2017   Osteoporosis without current pathological  fracture 10/01/2015   Varicose veins of lower extremities with other complications 69/62/9528   Essential hypertension, benign 08/27/2011   GERD (gastroesophageal reflux disease) 08/27/2011    REFERRING PROVIDER: Harden Mo MD  REFERRING DIAG: S/p left total knee replacement.  THERAPY DIAG:  Chronic pain of left knee  Stiffness of left knee, not elsewhere classified  Localized edema  Muscle weakness (generalized)  Rationale for Evaluation and Treatment Rehabilitation  ONSET DATE: 09/09/29 (surgery date).  SUBJECTIVE:   SUBJECTIVE STATEMENT: The patient presents to the clinic today s/p left total knee replacement performed on 09/09/21.  She states she has been using a CPM. Her knee pain is rated at a 7/10 today and she reports her left hip has been hurting a lot as well.  She states she has been using ice and taking her medication to decrease pain.  She developed a large blister in her left proximal medial tibial region.  She had a f/u MD visit and she states she was told it will eventually burst.  She is using a FWW for safe ambulation.  PERTINENT HISTORY: Right ankle ORIF. OBSERVATION:  Steri-strips intact over patient's left knee.  Large blister in her left proximal medial tibial region.  PAIN:  Are you having pain? Yes: NPRS scale: 7/10 Pain location: Left knee. Pain description: "Spasm."  PRECAUTIONS: Other: No ultrasound.  WEIGHT BEARING RESTRICTIONS No  FALLS:  Has patient fallen in last 6 months? Yes. Number of falls 1, prior to surgery.  Missed a railing.  LIVING ENVIRONMENT: Lives in: House/apartment Ramp. Has following equipment at home: Walker - 2 wheeled    PLOF: Independent with basic ADLs  PATIENT GOALS Get around better with less pain.   OBJECTIVE:    PATIENT SURVEYS:  FOTO Complete.  EDEMA:  Circumferential: LT 2 cms > RT.  PALPATION: C/o diffuse left knee pain currently.  LOWER EXTREMITY ROM:  Active left knee extension to -15 and  passive to -9 degrees.  Active left knee flexion to 82 degrees and passive to 88 degrees.  LOWER EXTREMITY MMT:  Right hip flexion is 4+/5, left knee extension is 4+/5.   GAIT: Patient ambulating with a FWW with decreased left step length and somewhat flat-footed in nature.   TODAY'S TREATMENT: Nustep level 1 x 10 minutes.   ASSESSMENT:  CLINICAL IMPRESSION: The patient presents to OPPT s/p left total knee replacement performed on 09/09/21.  She is pleased with her progress thus far.  She has been compliant to using a CPM at home.  She has a loss of left knee flexion at this time.  She has an expected amount of edema.  She also has c/o left hip pain.  She is walking with a FWW for safety with decreased step-length and decreased dorsiflexion.  She has large blister in her left proximal medial tibial region.  Patient will benefit from skilled physical therapy intervention to address pain and deficits.  OBJECTIVE IMPAIRMENTS Abnormal gait, decreased activity tolerance, difficulty walking, decreased ROM, decreased strength, increased edema, and pain.   ACTIVITY LIMITATIONS standing and locomotion level  PARTICIPATION LIMITATIONS: meal prep and cleaning  REHAB POTENTIAL: Excellent  CLINICAL DECISION MAKING: Stable/uncomplicated  EVALUATION COMPLEXITY: Low   GOALS: Goals reviewed with patient? Yes  SHORT TERM GOALS: Target date: 10/01/2021  Ind with an initial HEP. Baseline: Goal status: INITIAL  2.  Full active left knee extension. Baseline:  Goal status: INITIAL  3.  Active left knee flexion to 100 degrees. Baseline:  Goal status: INITIAL  LONG TERM GOALS: Target date: 10/29/2021   Ind with an advanced HEP. Baseline:  Goal status: INITIAL  2.  Active left knee flexion to 115 degrees+ so the patient can perform functional tasks and do so with pain not > 2-3/10. Baseline:  Goal status: INITIAL  3.  Increase left hip and knee strength to a solid 5/5 to provide good  stability for accomplishment of functional activities. Baseline:  Goal status: INITIAL  4.  Perform a reciprocating stair gait with one railing with pain not > 2-3/10. Baseline:  Goal status: INITIAL  PLAN: PT FREQUENCY:  2-3 times a week.  PT DURATION: 6 weeks  PLANNED INTERVENTIONS: Therapeutic exercises, Therapeutic activity, Neuromuscular re-education, Gait training, Patient/Family education, Self Care, Stair training, Electrical stimulation, Cryotherapy, Vasopneumatic device, and Manual therapy  PLAN FOR NEXT SESSION: Progress per TKA protocol.   Wilkin Lippy, [place], PT 09/17/2021, 1:45 PM

## 2021-09-22 ENCOUNTER — Encounter: Payer: Self-pay | Admitting: Physical Therapy

## 2021-09-22 ENCOUNTER — Ambulatory Visit: Payer: Medicare Other | Attending: Orthopedic Surgery | Admitting: Physical Therapy

## 2021-09-22 DIAGNOSIS — G8929 Other chronic pain: Secondary | ICD-10-CM | POA: Diagnosis not present

## 2021-09-22 DIAGNOSIS — R600 Localized edema: Secondary | ICD-10-CM | POA: Insufficient documentation

## 2021-09-22 DIAGNOSIS — M25562 Pain in left knee: Secondary | ICD-10-CM | POA: Diagnosis not present

## 2021-09-22 DIAGNOSIS — M25662 Stiffness of left knee, not elsewhere classified: Secondary | ICD-10-CM | POA: Insufficient documentation

## 2021-09-22 DIAGNOSIS — M6281 Muscle weakness (generalized): Secondary | ICD-10-CM | POA: Insufficient documentation

## 2021-09-22 NOTE — Therapy (Signed)
OUTPATIENT PHYSICAL THERAPY LOWER EXTREMITY TREATMENT   Patient Name: NELDA LUCKEY MRN: [id number] DOB:1933/08/08, 88 y.o., female Today's Date: 09/22/2021   PT End of Session - 09/22/21 1308     Visit Number 2    Number of Visits 12    Date for PT Re-Evaluation 12/16/21    PT Start Time 1303    PT Stop Time 1345    PT Time Calculation (min) 42 min    Equipment Utilized During Treatment Other (comment)   FWW   Activity Tolerance Patient tolerated treatment well    Behavior During Therapy [facility] for tasks assessed/performed             Past Medical History:  Diagnosis Date   Arthritis    Cancer ([place]) 1996   left breast   Essential hypertension, benign    GERD (gastroesophageal reflux disease)    History of kidney stones    Past Surgical History:  Procedure Laterality Date   ABDOMINAL HYSTERECTOMY     Absess Off Intestine     cataract surgery Left 08/10/2011   cataract surgery Right 11/01/2011   CHOLECYSTECTOMY     1972   COLONOSCOPY N/A 02/06/2015   Procedure: COLONOSCOPY;  Surgeon: Rogene Houston, MD;  [facility];  Service: Endoscopy;  Laterality: N/A;  [place]   Right ankle ORIF   GALLBLADDER SURGERY     Rupture Lower Stomach   HERNIA REPAIR     Hysterectomy ---- unknown     1980   Lapoma Tumor     Left Breast Masectomy     1986   MASS EXCISION N/A 01/30/2018   Procedure: EXCISION MASS 3CM ON BACK AND 3CM ON ABDOMEN;  Surgeon: Virl Cagey, MD;  Location: [facility];  Service: General;  Laterality: N/A;   Right Ankle Surgery     TOTAL KNEE ARTHROPLASTY Left 09/09/2021   Procedure: TOTAL KNEE ARTHROPLASTY;  Surgeon: Vickey Huger, MD;  [facility];  Service: Orthopedics;  Laterality: Left;   Patient Active Problem List   Diagnosis Date Noted   Submental adenopathy 10/30/2020   Lipoma of torso 09/09/2020   Osteoarthritis 09/11/2018   Mass of subcutaneous tissue of back 01/19/2018   Mass of soft tissue of abdomen  01/19/2018   Severe obesity (BMI 35.0-39.9) with comorbidity ([place]) 12/13/2017   Osteoporosis without current pathological fracture 10/01/2015   Varicose veins of lower extremities with other complications 93/71/6967   Essential hypertension, benign 08/27/2011   GERD (gastroesophageal reflux disease) 08/27/2011    REFERRING PROVIDER: Harden Mo MD  REFERRING DIAG: S/p left total knee replacement.  THERAPY DIAG:  Chronic pain of left knee  Stiffness of left knee, not elsewhere classified  Localized edema  Muscle weakness (generalized)  Rationale for Evaluation and Treatment Rehabilitation  ONSET DATE: 09/09/29 (surgery date).  SUBJECTIVE:   SUBJECTIVE STATEMENT: Reports that she taped some gauze below the blister as it was draining. More soreness reported in L quad and calf.  PERTINENT HISTORY: Right ankle ORIF.  OBSERVATION:  Steri-strips intact over patient's left knee.  Large blister in her left proximal medial tibial region which has now began draining.  PAIN:  Are you having pain? Yes: NPRS scale: 6/10 Pain location: Left knee. Pain description: sore  PRECAUTIONS: Other: No ultrasound.  WEIGHT BEARING RESTRICTIONS No  FALLS:  Has patient fallen in last 6 months? Yes. Number of falls 1, prior to surgery.  Missed a railing.  LIVING ENVIRONMENT:  Lives in: House/apartment Ramp. Has following equipment at home: Walker - 2 wheeled  PLOF: Independent with basic ADLs  PATIENT GOALS Get around better with less pain.   OBJECTIVE:  PATIENT SURVEYS:  FOTO Complete.   TODAY'S TREATMENT:                                    EXERCISE LOG  Exercise Repetitions and Resistance Comments  Nustep L4, seat 8 x15 min   Rockerboard X3 min   Lunges  8" step x20 reps    LAQ X20 reps AROM   Heel slides Seated x2 min   Dynamic HS stretch Seated x2 min   Heel/toe raises X20 reps    Blank cell = exercise not performed today    ASSESSMENT:  CLINICAL  IMPRESSION: Patient presented in clinic with reports of tightness to the L quad, HS, calf. Blister over the medial tibia has began draining as well which patient states she is cleaning with alcohol on a paper towel but not using over the incision. Small area of black scabbing also noted in superior incision area. Patient able to tolerate all therex well but prefers sitting instead of supine. Redness noted from L knee to ankle and no TED hose donned as patient states that they were too painful. Patient is using hot towel over the L quad to reduce tightness. No modalities completed due to the blister of the medial L tibia.  OBJECTIVE IMPAIRMENTS Abnormal gait, decreased activity tolerance, difficulty walking, decreased ROM, decreased strength, increased edema, and pain.   ACTIVITY LIMITATIONS standing and locomotion level  PARTICIPATION LIMITATIONS: meal prep and cleaning  REHAB POTENTIAL: Excellent  CLINICAL DECISION MAKING: Stable/uncomplicated  EVALUATION COMPLEXITY: Low   GOALS: Goals reviewed with patient? Yes  SHORT TERM GOALS: Target date: 10/06/2021  Ind with an initial HEP. Baseline: Goal status: INITIAL  2.  Full active left knee extension. Baseline:  Goal status: INITIAL  3.  Active left knee flexion to 100 degrees. Baseline:  Goal status: INITIAL  LONG TERM GOALS: Target date: 11/03/2021   Ind with an advanced HEP. Baseline:  Goal status: INITIAL  2.  Active left knee flexion to 115 degrees+ so the patient can perform functional tasks and do so with pain not > 2-3/10. Baseline:  Goal status: INITIAL  3.  Increase left hip and knee strength to a solid 5/5 to provide good stability for accomplishment of functional activities. Baseline:  Goal status: INITIAL  4.  Perform a reciprocating stair gait with one railing with pain not > 2-3/10. Baseline:  Goal status: INITIAL  PLAN: PT FREQUENCY:  2-3 times a week.  PT DURATION: 6 weeks  PLANNED INTERVENTIONS:  Therapeutic exercises, Therapeutic activity, Neuromuscular re-education, Gait training, Patient/Family education, Self Care, Stair training, Electrical stimulation, Cryotherapy, Vasopneumatic device, and Manual therapy  PLAN FOR NEXT SESSION: Progress per TKA protocol.   Standley Brooking, PTA 09/22/2021, 1:57 PM

## 2021-09-25 ENCOUNTER — Ambulatory Visit: Payer: Medicare Other | Admitting: Physical Therapy

## 2021-09-25 DIAGNOSIS — M25662 Stiffness of left knee, not elsewhere classified: Secondary | ICD-10-CM

## 2021-09-25 DIAGNOSIS — M6281 Muscle weakness (generalized): Secondary | ICD-10-CM

## 2021-09-25 DIAGNOSIS — R600 Localized edema: Secondary | ICD-10-CM | POA: Diagnosis not present

## 2021-09-25 DIAGNOSIS — M25562 Pain in left knee: Secondary | ICD-10-CM | POA: Diagnosis not present

## 2021-09-25 DIAGNOSIS — G8929 Other chronic pain: Secondary | ICD-10-CM

## 2021-09-25 NOTE — Therapy (Signed)
OUTPATIENT PHYSICAL THERAPY LOWER EXTREMITY TREATMENT   Patient Name: Anna Bradford MRN: [id number] DOB:1933/06/23, 88 y.o., female Today's Date: 09/25/2021   PT End of Session - 09/25/21 1234     Visit Number 3    Number of Visits 12    Date for PT Re-Evaluation 12/16/21    PT Start Time 1118    PT Stop Time 1202    PT Time Calculation (min) 44 min    Activity Tolerance Patient tolerated treatment well    Behavior During Therapy [facility] for tasks assessed/performed             Past Medical History:  Diagnosis Date   Arthritis    Cancer ([place]) 1996   left breast   Essential hypertension, benign    GERD (gastroesophageal reflux disease)    History of kidney stones    Past Surgical History:  Procedure Laterality Date   ABDOMINAL HYSTERECTOMY     Absess Off Intestine     cataract surgery Left 08/10/2011   cataract surgery Right 11/01/2011   CHOLECYSTECTOMY     1972   COLONOSCOPY N/A 02/06/2015   Procedure: COLONOSCOPY;  Surgeon: Rogene Houston, MD;  [facility];  Service: Endoscopy;  Laterality: N/A;  [place]   Right ankle ORIF   GALLBLADDER SURGERY     Rupture Lower Stomach   HERNIA REPAIR     Hysterectomy ---- unknown     1980   Lapoma Tumor     Left Breast Masectomy     1986   MASS EXCISION N/A 01/30/2018   Procedure: EXCISION MASS 3CM ON BACK AND 3CM ON ABDOMEN;  Surgeon: Virl Cagey, MD;  Location: [facility] ORS;  Service: General;  Laterality: N/A;   Right Ankle Surgery     TOTAL KNEE ARTHROPLASTY Left 09/09/2021   Procedure: TOTAL KNEE ARTHROPLASTY;  Surgeon: Vickey Huger, MD;  [facility]: [facility];  Service: Orthopedics;  Laterality: Left;   Patient Active Problem List   Diagnosis Date Noted   Submental adenopathy 10/30/2020   Lipoma of torso 09/09/2020   Osteoarthritis 09/11/2018   Mass of subcutaneous tissue of back 01/19/2018   Mass of soft tissue of abdomen 01/19/2018   Severe obesity (BMI 35.0-39.9) with comorbidity  ([place]) 12/13/2017   Osteoporosis without current pathological fracture 10/01/2015   Varicose veins of lower extremities with other complications 29/92/4268   Essential hypertension, benign 08/27/2011   GERD (gastroesophageal reflux disease) 08/27/2011    REFERRING PROVIDER: Harden Mo MD  REFERRING DIAG: S/p left total knee replacement.  THERAPY DIAG:  Chronic pain of left knee  Muscle weakness (generalized)  Stiffness of left knee, not elsewhere classified  Localized edema  Rationale for Evaluation and Treatment Rehabilitation  ONSET DATE: 09/09/29 (surgery date).  SUBJECTIVE:   SUBJECTIVE STATEMENT: No new complaints.  Patient has guaze covering the blister that burst.  Patient states her left Sciatic nerve (left buttock region) is hurting more than her knee.     OBJECTIVE:  PATIENT SURVEYS:  FOTO Complete.   TODAY'S TREATMENT:                                    EXERCISE LOG  Exercise Repetitions and Resistance Comments  Nustep L3, x16 min, moving seat forward x 2 to increase flexion.  Manual:  In supine:  PROM into left knee flexion and extension (10 minutes).  Vasopneumatic x 10 minutes to patient's left knee.  Patient tolerated treatment well without complaint.   ASSESSMENT:  CLINICAL IMPRESSION: Patient did very well today.  Post-stretching the patient achieved 100 degrees of left knee flexion.  She states her left Sciatic nerve has been bothering her a lot. She guaze covering her left knee where her blister burst.     GOALS: Goals reviewed with patient? Yes  SHORT TERM GOALS: Target date: 10/09/2021  Ind with an initial HEP. Baseline: Goal status: INITIAL  2.  Full active left knee extension. Baseline:  Goal status: INITIAL  3.  Active left knee flexion to 100 degrees. Baseline:  Goal status: INITIAL  LONG TERM GOALS: Target date: 11/06/2021   Ind with an advanced HEP. Baseline:  Goal status:  INITIAL  2.  Active left knee flexion to 115 degrees+ so the patient can perform functional tasks and do so with pain not > 2-3/10. Baseline:  Goal status: INITIAL  3.  Increase left hip and knee strength to a solid 5/5 to provide good stability for accomplishment of functional activities. Baseline:  Goal status: INITIAL  4.  Perform a reciprocating stair gait with one railing with pain not > 2-3/10. Baseline:  Goal status: INITIAL  PLAN: PT FREQUENCY:  2-3 times a week.  PT DURATION: 6 weeks  PLANNED INTERVENTIONS: Therapeutic exercises, Therapeutic activity, Neuromuscular re-education, Gait training, Patient/Family education, Self Care, Stair training, Electrical stimulation, Cryotherapy, Vasopneumatic device, and Manual therapy  PLAN FOR NEXT SESSION: Progress per TKA protocol.   Albertine Lafoy, [place], PT 09/25/2021, 12:36 PM

## 2021-09-28 ENCOUNTER — Ambulatory Visit: Payer: Medicare Other | Admitting: Physical Therapy

## 2021-09-28 DIAGNOSIS — M6281 Muscle weakness (generalized): Secondary | ICD-10-CM

## 2021-09-28 DIAGNOSIS — R600 Localized edema: Secondary | ICD-10-CM

## 2021-09-28 DIAGNOSIS — M25562 Pain in left knee: Secondary | ICD-10-CM | POA: Diagnosis not present

## 2021-09-28 DIAGNOSIS — G8929 Other chronic pain: Secondary | ICD-10-CM

## 2021-09-28 DIAGNOSIS — M25662 Stiffness of left knee, not elsewhere classified: Secondary | ICD-10-CM | POA: Diagnosis not present

## 2021-09-28 NOTE — Therapy (Signed)
OUTPATIENT PHYSICAL THERAPY LOWER EXTREMITY TREATMENT   Patient Name: Anna Bradford MRN: [id number] DOB:10/16/1933, 88 y.o., female Today's Date: 09/28/2021   PT End of Session - 09/28/21 1339     Visit Number 4    Number of Visits 12    Date for PT Re-Evaluation 12/16/21    PT Start Time 0100    PT Stop Time 0145    PT Time Calculation (min) 45 min    Activity Tolerance Patient limited by pain    Behavior During Therapy Restless   Restless due to left buttock pain.            Past Medical History:  Diagnosis Date   Arthritis    Cancer [facility]) 1996   left breast   Essential hypertension, benign    GERD (gastroesophageal reflux disease)    History of kidney stones    Past Surgical History:  Procedure Laterality Date   ABDOMINAL HYSTERECTOMY     Absess Off Intestine     cataract surgery Left 08/10/2011   cataract surgery Right 11/01/2011   CHOLECYSTECTOMY     1972   COLONOSCOPY N/A 02/06/2015   Procedure: COLONOSCOPY;  Surgeon: Rogene Houston, MD;  [facility];  Service: Endoscopy;  Laterality: N/A;  [place]   Right ankle ORIF   GALLBLADDER SURGERY     Rupture Lower Stomach   HERNIA REPAIR     Hysterectomy ---- unknown     1980   Lapoma Tumor     Left Breast Masectomy     1986   MASS EXCISION N/A 01/30/2018   Procedure: EXCISION MASS 3CM ON BACK AND 3CM ON ABDOMEN;  Surgeon: Virl Cagey, MD;  Location: [facility];  Service: General;  Laterality: N/A;   Right Ankle Surgery     TOTAL KNEE ARTHROPLASTY Left 09/09/2021   Procedure: TOTAL KNEE ARTHROPLASTY;  Surgeon: Vickey Huger, MD;  [facility];  Service: Orthopedics;  Laterality: Left;   Patient Active Problem List   Diagnosis Date Noted   Submental adenopathy 10/30/2020   Lipoma of torso 09/09/2020   Osteoarthritis 09/11/2018   Mass of subcutaneous tissue of back 01/19/2018   Mass of soft tissue of abdomen 01/19/2018   Severe obesity (BMI 35.0-39.9) with  comorbidity ([place]) 12/13/2017   Osteoporosis without current pathological fracture 10/01/2015   Varicose veins of lower extremities with other complications 57/32/2025   Essential hypertension, benign 08/27/2011   GERD (gastroesophageal reflux disease) 08/27/2011    REFERRING PROVIDER: Harden Mo MD  REFERRING DIAG: S/p left total knee replacement.  THERAPY DIAG:  Chronic pain of left knee  Muscle weakness (generalized)  Stiffness of left knee, not elsewhere classified  Localized edema  Rationale for Evaluation and Treatment Rehabilitation  ONSET DATE: 09/09/29 (surgery date).  SUBJECTIVE:   SUBJECTIVE STATEMENT: Continued c/o left Sciatic nerve (left buttock region) is hurting more than her knee.  Blistered area looking good.  She noted some small blisters also over her right lower leg and some on left (unbroken).  She states she will give her doctor a call today. Her knee is not significantly warm to touch and no calf pain.  OBJECTIVE:  PATIENT SURVEYS:  FOTO Complete.   TODAY'S TREATMENT:                                    EXERCISE LOG  Exercise Repetitions  and Resistance Comments  Nustep L3, x16 min, moving seat forward x 2 to increase flexion.                           Manual:  Seated:  Left knee passive range of motion (8 minutes) into flexion and  extension with low load long duration stretching technique utilized. Vasopneumatic x 15 minutes to patient's left knee.  Patient seated.  Patient tolerated treatment well without complaint.   ASSESSMENT:  CLINICAL IMPRESSION: Patient's left knee range of motion is improving nicely but her left buttock ("Sciatic nerve") continues to be very problematic and limiting her a lot. She noted some small blisters also over her right lower leg and some on left (unbroken).  She states she will give her doctor a call today. Her knee is not significantly warm to touch and no calf pain.  Her large blistered area is looking  good.  GOALS: Goals reviewed with patient? Yes  SHORT TERM GOALS: Target date: 10/12/2021  Ind with an initial HEP. Baseline: Goal status: INITIAL  2.  Full active left knee extension. Baseline:  Goal status: INITIAL  3.  Active left knee flexion to 100 degrees. Baseline:  Goal status: INITIAL  LONG TERM GOALS: Target date: 11/09/2021   Ind with an advanced HEP. Baseline:  Goal status: INITIAL  2.  Active left knee flexion to 115 degrees+ so the patient can perform functional tasks and do so with pain not > 2-3/10. Baseline:  Goal status: INITIAL  3.  Increase left hip and knee strength to a solid 5/5 to provide good stability for accomplishment of functional activities. Baseline:  Goal status: INITIAL  4.  Perform a reciprocating stair gait with one railing with pain not > 2-3/10. Baseline:  Goal status: INITIAL  PLAN: PT FREQUENCY:  2-3 times a week.  PT DURATION: 6 weeks  PLANNED INTERVENTIONS: Therapeutic exercises, Therapeutic activity, Neuromuscular re-education, Gait training, Patient/Family education, Self Care, Stair training, Electrical stimulation, Cryotherapy, Vasopneumatic device, and Manual therapy  PLAN FOR NEXT SESSION: Progress per TKA protocol.   Rexine Gowens, [place], PT 09/28/2021, 2:27 PM

## 2021-09-30 ENCOUNTER — Encounter: Payer: Medicare Other | Admitting: Physical Therapy

## 2021-10-02 ENCOUNTER — Encounter: Payer: Self-pay | Admitting: Physical Therapy

## 2021-10-02 ENCOUNTER — Ambulatory Visit: Payer: Medicare Other | Admitting: Physical Therapy

## 2021-10-02 DIAGNOSIS — M25662 Stiffness of left knee, not elsewhere classified: Secondary | ICD-10-CM

## 2021-10-02 DIAGNOSIS — G8929 Other chronic pain: Secondary | ICD-10-CM

## 2021-10-02 DIAGNOSIS — M6281 Muscle weakness (generalized): Secondary | ICD-10-CM

## 2021-10-02 DIAGNOSIS — M25562 Pain in left knee: Secondary | ICD-10-CM | POA: Diagnosis not present

## 2021-10-02 DIAGNOSIS — R600 Localized edema: Secondary | ICD-10-CM

## 2021-10-02 NOTE — Therapy (Signed)
OUTPATIENT PHYSICAL THERAPY LOWER EXTREMITY TREATMENT   Patient Name: Anna Bradford MRN: [id number] DOB:06/20/1933, 88 y.o., female Today's Date: 10/02/2021   PT End of Session - 10/02/21 0918     Visit Number 5    Number of Visits 12    Date for PT Re-Evaluation 12/16/21    PT Start Time 0917    PT Stop Time 0956    PT Time Calculation (min) 39 min    Activity Tolerance Patient limited by pain    Behavior During Therapy [facility] for tasks assessed/performed   Restless due to left buttock pain.            Past Medical History:  Diagnosis Date   Arthritis    Cancer [facility]) 1996   left breast   Essential hypertension, benign    GERD (gastroesophageal reflux disease)    History of kidney stones    Past Surgical History:  Procedure Laterality Date   ABDOMINAL HYSTERECTOMY     Absess Off Intestine     cataract surgery Left 08/10/2011   cataract surgery Right 11/01/2011   CHOLECYSTECTOMY     1972   COLONOSCOPY N/A 02/06/2015   Procedure: COLONOSCOPY;  Surgeon: Rogene Houston, MD;  [facility];  Service: Endoscopy;  Laterality: N/A;  [place]   Right ankle ORIF   GALLBLADDER SURGERY     Rupture Lower Stomach   HERNIA REPAIR     Hysterectomy ---- unknown     1980   Lapoma Tumor     Left Breast Masectomy     1986   MASS EXCISION N/A 01/30/2018   Procedure: EXCISION MASS 3CM ON BACK AND 3CM ON ABDOMEN;  Surgeon: Virl Cagey, MD;  Location: [facility];  Service: General;  Laterality: N/A;   Right Ankle Surgery     TOTAL KNEE ARTHROPLASTY Left 09/09/2021   Procedure: TOTAL KNEE ARTHROPLASTY;  Surgeon: Vickey Huger, MD;  [facility];  Service: Orthopedics;  Laterality: Left;   Patient Active Problem List   Diagnosis Date Noted   Submental adenopathy 10/30/2020   Lipoma of torso 09/09/2020   Osteoarthritis 09/11/2018   Mass of subcutaneous tissue of back 01/19/2018   Mass of soft tissue of abdomen 01/19/2018   Severe obesity (BMI  35.0-39.9) with comorbidity ([place]) 12/13/2017   Osteoporosis without current pathological fracture 10/01/2015   Varicose veins of lower extremities with other complications 58/09/9831   Essential hypertension, benign 08/27/2011   GERD (gastroesophageal reflux disease) 08/27/2011    REFERRING PROVIDER: Harden Mo MD  REFERRING DIAG: S/p left total knee replacement.  THERAPY DIAG:  Chronic pain of left knee  Muscle weakness (generalized)  Stiffness of left knee, not elsewhere classified  Localized edema  Rationale for Evaluation and Treatment Rehabilitation  ONSET DATE: 09/09/29 (surgery date).  SUBJECTIVE:   SUBJECTIVE STATEMENT: Continued to have L sciatica symptoms which keeps coming back. States that the blister came off this morning but her leg looks much better.  PAIN:  Are you having pain? Yes: NPRS scale: 1-2/10 Pain location: L knee Pain description: stinging Aggravating factors:   Relieving factors:      OBJECTIVE:  PATIENT SURVEYS:  FOTO Complete.   TODAY'S TREATMENT:                                    EXERCISE LOG  Exercise Repetitions and Resistance Comments  Nustep L3, x20 min, seat 11-10 Seat 9 lasted approximately one minute due to shooting pain in L buttock > LE  Heel slides seated X2 min   LAQ X3 min with 5 sec holds                    Modalities  Date:  Vaso: Knee, Low, 10 mins, Edema   ASSESSMENT:  CLINICAL IMPRESSION: Patient arrived with continued L sciatic related symptoms but feels better overall. Patient able to complete seated exercises better today as the blister on L knee peeled off while showering this morning. Patient able to demonstrate good quad activation with LAQ. Reported more sciatica related pain with seat advancement on Nustep and felt nauceous as well as with LAQ. Normal vasopneumatic response noted following removal of the modality.  GOALS: Goals reviewed with patient? Yes  SHORT TERM GOALS: Target date:  10/16/2021  Ind with an initial HEP. Baseline: Goal status: INITIAL  2.  Full active left knee extension. Baseline:  Goal status: INITIAL  3.  Active left knee flexion to 100 degrees. Baseline:  Goal status: INITIAL  LONG TERM GOALS: Target date: 11/13/2021   Ind with an advanced HEP. Baseline:  Goal status: INITIAL  2.  Active left knee flexion to 115 degrees+ so the patient can perform functional tasks and do so with pain not > 2-3/10. Baseline:  Goal status: INITIAL  3.  Increase left hip and knee strength to a solid 5/5 to provide good stability for accomplishment of functional activities. Baseline:  Goal status: INITIAL  4.  Perform a reciprocating stair gait with one railing with pain not > 2-3/10. Baseline:  Goal status: INITIAL  PLAN: PT FREQUENCY:  2-3 times a week.  PT DURATION: 6 weeks  PLANNED INTERVENTIONS: Therapeutic exercises, Therapeutic activity, Neuromuscular re-education, Gait training, Patient/Family education, Self Care, Stair training, Electrical stimulation, Cryotherapy, Vasopneumatic device, and Manual therapy  PLAN FOR NEXT SESSION: Progress per TKA protocol.   Standley Brooking, PTA 10/02/2021, 10:35 AM

## 2021-10-05 ENCOUNTER — Ambulatory Visit: Payer: Medicare Other

## 2021-10-05 DIAGNOSIS — R600 Localized edema: Secondary | ICD-10-CM | POA: Diagnosis not present

## 2021-10-05 DIAGNOSIS — M6281 Muscle weakness (generalized): Secondary | ICD-10-CM | POA: Diagnosis not present

## 2021-10-05 DIAGNOSIS — G8929 Other chronic pain: Secondary | ICD-10-CM | POA: Diagnosis not present

## 2021-10-05 DIAGNOSIS — M25662 Stiffness of left knee, not elsewhere classified: Secondary | ICD-10-CM

## 2021-10-05 DIAGNOSIS — M25562 Pain in left knee: Secondary | ICD-10-CM | POA: Diagnosis not present

## 2021-10-05 NOTE — Therapy (Signed)
OUTPATIENT PHYSICAL THERAPY LOWER EXTREMITY TREATMENT   Patient Name: Anna Bradford MRN: [id number] DOB:January 13, 1934, 88 y.o., female Today's Date: 10/05/2021   PT End of Session - 10/05/21 1306     Visit Number 6    Number of Visits 12    Date for PT Re-Evaluation 12/16/21    PT Start Time 1300    PT Stop Time 1344    PT Time Calculation (min) 44 min    Activity Tolerance Patient limited by pain    Behavior During Therapy [facility] for tasks assessed/performed   Restless due to left buttock pain.            Past Medical History:  Diagnosis Date   Arthritis    Cancer [facility]) 1996   left breast   Essential hypertension, benign    GERD (gastroesophageal reflux disease)    History of kidney stones    Past Surgical History:  Procedure Laterality Date   ABDOMINAL HYSTERECTOMY     Absess Off Intestine     cataract surgery Left 08/10/2011   cataract surgery Right 11/01/2011   CHOLECYSTECTOMY     1972   COLONOSCOPY N/A 02/06/2015   Procedure: COLONOSCOPY;  Surgeon: Rogene Houston, MD;  [facility];  Service: Endoscopy;  Laterality: N/A;  [place]   Right ankle ORIF   GALLBLADDER SURGERY     Rupture Lower Stomach   HERNIA REPAIR     Hysterectomy ---- unknown     1980   Lapoma Tumor     Left Breast Masectomy     1986   MASS EXCISION N/A 01/30/2018   Procedure: EXCISION MASS 3CM ON BACK AND 3CM ON ABDOMEN;  Surgeon: Virl Cagey, MD;  Location: [facility];  Service: General;  Laterality: N/A;   Right Ankle Surgery     TOTAL KNEE ARTHROPLASTY Left 09/09/2021   Procedure: TOTAL KNEE ARTHROPLASTY;  Surgeon: Vickey Huger, MD;  [facility];  Service: Orthopedics;  Laterality: Left;   Patient Active Problem List   Diagnosis Date Noted   Submental adenopathy 10/30/2020   Lipoma of torso 09/09/2020   Osteoarthritis 09/11/2018   Mass of subcutaneous tissue of back 01/19/2018   Mass of soft tissue of abdomen 01/19/2018   Severe obesity (BMI  35.0-39.9) with comorbidity ([place]) 12/13/2017   Osteoporosis without current pathological fracture 10/01/2015   Varicose veins of lower extremities with other complications 76/54/6503   Essential hypertension, benign 08/27/2011   GERD (gastroesophageal reflux disease) 08/27/2011    REFERRING PROVIDER: Harden Mo MD  REFERRING DIAG: S/p left total knee replacement.  THERAPY DIAG:  Chronic pain of left knee  Muscle weakness (generalized)  Stiffness of left knee, not elsewhere classified  Localized edema  Rationale for Evaluation and Treatment Rehabilitation  ONSET DATE: 09/09/29 (surgery date).  SUBJECTIVE:   SUBJECTIVE STATEMENT: Pt reports left knee soreness today, but denies any sciatic pain since falling out her lift chair over the weekend.   PAIN:  Are you having pain? Yes: NPRS scale: 6/10 Pain location: L knee Pain description: stinging Aggravating factors:   Relieving factors:      OBJECTIVE:  PATIENT SURVEYS:  FOTO Complete.   TODAY'S TREATMENT:                                    EXERCISE LOG  Exercise Repetitions and Resistance Comments  Nustep L3 x  15 min, seat 8   Heel slides seated X2 mins   Ball Squeezes X2 mins   Hip Abduction Red t-band x 2 mins   Seated marches X2 mins   LAQ X3 min with 5 sec holds   Lunges 14 inch box 20 reps x 3 sec hold   Heel/Toe Raises 20 reps    Rockerboard 3 mins   Forward Step Ups 4 inch box x 20 reps RLE      ASSESSMENT:  CLINICAL IMPRESSION: Pt arrives for today's treatment session reporting 6/10 left knee pain upon arriving for today's treatment session.  Pt also denies any back pain today.  Pt introduced to standing exercises today without issue.  Pt requiring min cues for proper technique with all newly added exercises.  Pt reported decreased left knee pain at completion of today's treatment session.  GOALS: Goals reviewed with patient? Yes  SHORT TERM GOALS: Target date: 10/19/2021  Ind with an initial  HEP. Baseline: Goal status: INITIAL  2.  Full active left knee extension. Baseline:  Goal status: INITIAL  3.  Active left knee flexion to 100 degrees. Baseline:  Goal status: INITIAL  LONG TERM GOALS: Target date: 11/16/2021   Ind with an advanced HEP. Baseline:  Goal status: INITIAL  2.  Active left knee flexion to 115 degrees+ so the patient can perform functional tasks and do so with pain not > 2-3/10. Baseline:  Goal status: INITIAL  3.  Increase left hip and knee strength to a solid 5/5 to provide good stability for accomplishment of functional activities. Baseline:  Goal status: INITIAL  4.  Perform a reciprocating stair gait with one railing with pain not > 2-3/10. Baseline:  Goal status: INITIAL  PLAN: PT FREQUENCY:  2-3 times a week.  PT DURATION: 6 weeks  PLANNED INTERVENTIONS: Therapeutic exercises, Therapeutic activity, Neuromuscular re-education, Gait training, Patient/Family education, Self Care, Stair training, Electrical stimulation, Cryotherapy, Vasopneumatic device, and Manual therapy  PLAN FOR NEXT SESSION: Progress per TKA protocol.   Kathrynn Ducking, PTA 10/05/2021, 1:58 PM

## 2021-10-07 ENCOUNTER — Other Ambulatory Visit: Payer: Self-pay | Admitting: Family Medicine

## 2021-10-07 ENCOUNTER — Ambulatory Visit: Payer: Medicare Other | Admitting: Physical Therapy

## 2021-10-07 DIAGNOSIS — G8929 Other chronic pain: Secondary | ICD-10-CM

## 2021-10-07 DIAGNOSIS — S161XXD Strain of muscle, fascia and tendon at neck level, subsequent encounter: Secondary | ICD-10-CM

## 2021-10-07 DIAGNOSIS — M6281 Muscle weakness (generalized): Secondary | ICD-10-CM | POA: Diagnosis not present

## 2021-10-07 DIAGNOSIS — R600 Localized edema: Secondary | ICD-10-CM | POA: Diagnosis not present

## 2021-10-07 DIAGNOSIS — M25562 Pain in left knee: Secondary | ICD-10-CM | POA: Diagnosis not present

## 2021-10-07 DIAGNOSIS — M25662 Stiffness of left knee, not elsewhere classified: Secondary | ICD-10-CM | POA: Diagnosis not present

## 2021-10-07 NOTE — Therapy (Signed)
OUTPATIENT PHYSICAL THERAPY LOWER EXTREMITY TREATMENT   Patient Name: Anna Bradford MRN: [id number] DOB:1933-05-31, 88 y.o., female Today's Date: 10/07/2021   PT End of Session - 10/07/21 1351     Visit Number 7    Number of Visits 12    Date for PT Re-Evaluation 12/16/21    PT Start Time 0100    PT Stop Time 0154    PT Time Calculation (min) 54 min    Activity Tolerance Patient limited by pain    Behavior During Therapy [facility] for tasks assessed/performed              Past Medical History:  Diagnosis Date   Arthritis    Cancer ([place]) 1996   left breast   Essential hypertension, benign    GERD (gastroesophageal reflux disease)    History of kidney stones    Past Surgical History:  Procedure Laterality Date   ABDOMINAL HYSTERECTOMY     Absess Off Intestine     cataract surgery Left 08/10/2011   cataract surgery Right 11/01/2011   CHOLECYSTECTOMY     1972   COLONOSCOPY N/A 02/06/2015   Procedure: COLONOSCOPY;  Surgeon: Rogene Houston, MD;  [facility];  Service: Endoscopy;  Laterality: N/A;  [place]   Right ankle ORIF   GALLBLADDER SURGERY     Rupture Lower Stomach   HERNIA REPAIR     Hysterectomy ---- unknown     1980   Lapoma Tumor     Left Breast Masectomy     1986   MASS EXCISION N/A 01/30/2018   Procedure: EXCISION MASS 3CM ON BACK AND 3CM ON ABDOMEN;  Surgeon: Virl Cagey, MD;  Location: [facility];  Service: General;  Laterality: N/A;   Right Ankle Surgery     TOTAL KNEE ARTHROPLASTY Left 09/09/2021   Procedure: TOTAL KNEE ARTHROPLASTY;  Surgeon: Vickey Huger, MD;  Location: [facility];  Service: Orthopedics;  Laterality: Left;   Patient Active Problem List   Diagnosis Date Noted   Submental adenopathy 10/30/2020   Lipoma of torso 09/09/2020   Osteoarthritis 09/11/2018   Mass of subcutaneous tissue of back 01/19/2018   Mass of soft tissue of abdomen 01/19/2018   Severe obesity (BMI 35.0-39.9) with comorbidity ([place])  12/13/2017   Osteoporosis without current pathological fracture 10/01/2015   Varicose veins of lower extremities with other complications 08/65/7846   Essential hypertension, benign 08/27/2011   GERD (gastroesophageal reflux disease) 08/27/2011    REFERRING PROVIDER: Harden Mo MD  REFERRING DIAG: S/p left total knee replacement.  THERAPY DIAG:  Chronic pain of left knee  Muscle weakness (generalized)  Stiffness of left knee, not elsewhere classified  Localized edema  Rationale for Evaluation and Treatment Rehabilitation  ONSET DATE: 09/09/29 (surgery date).  SUBJECTIVE:   SUBJECTIVE STATEMENT: No new complaints. PAIN:  Are you having pain? Yes: NPRS scale: 6/10 Pain location: L knee Pain description: stinging Aggravating factors:   Relieving factors:      OBJECTIVE:  PATIENT SURVEYS:  FOTO Complete.   TODAY'S TREATMENT:                                    EXERCISE LOG  Exercise Repetitions and Resistance Comments  Nustep L3 x 15 min, progressing to seat 7.   Knee ext machine 10# 2 minutes.   Ham curl machine 30# 2 minutes.  Manual:    While seated PROM to patient's left knee x 8 minutes f/b vasopneumatic on low x 20 minutes.   ASSESSMENT:  CLINICAL IMPRESSION: Patient did an excellent job with the addition of weight machines today.  Her range of motion is improving nicely.   Goals reviewed with patient? Yes  SHORT TERM GOALS: Target date: 10/21/2021  Ind with an initial HEP. Baseline: Goal status: INITIAL  2.  Full active left knee extension. Baseline:  Goal status: INITIAL  3.  Active left knee flexion to 100 degrees. Baseline:  Goal status: INITIAL  LONG TERM GOALS: Target date: 11/18/2021   Ind with an advanced HEP. Baseline:  Goal status: INITIAL  2.  Active left knee flexion to 115 degrees+ so the patient can perform functional tasks and do so with pain not > 2-3/10. Baseline:  Goal  status: INITIAL  3.  Increase left hip and knee strength to a solid 5/5 to provide good stability for accomplishment of functional activities. Baseline:  Goal status: INITIAL  4.  Perform a reciprocating stair gait with one railing with pain not > 2-3/10. Baseline:  Goal status: INITIAL  PLAN: PT FREQUENCY:  2-3 times a week.  PT DURATION: 6 weeks  PLANNED INTERVENTIONS: Therapeutic exercises, Therapeutic activity, Neuromuscular re-education, Gait training, Patient/Family education, Self Care, Stair training, Electrical stimulation, Cryotherapy, Vasopneumatic device, and Manual therapy  PLAN FOR NEXT SESSION: Progress per TKA protocol.   Kendre Jacinto, [place], PT 10/07/2021, 2:52 PM

## 2021-10-08 ENCOUNTER — Other Ambulatory Visit: Payer: Self-pay | Admitting: Family Medicine

## 2021-10-08 DIAGNOSIS — R252 Cramp and spasm: Secondary | ICD-10-CM

## 2021-10-12 ENCOUNTER — Encounter: Payer: Self-pay | Admitting: Physical Therapy

## 2021-10-12 ENCOUNTER — Ambulatory Visit: Payer: Medicare Other | Admitting: Physical Therapy

## 2021-10-12 DIAGNOSIS — G8929 Other chronic pain: Secondary | ICD-10-CM | POA: Diagnosis not present

## 2021-10-12 DIAGNOSIS — M25662 Stiffness of left knee, not elsewhere classified: Secondary | ICD-10-CM | POA: Diagnosis not present

## 2021-10-12 DIAGNOSIS — M25562 Pain in left knee: Secondary | ICD-10-CM | POA: Diagnosis not present

## 2021-10-12 DIAGNOSIS — R600 Localized edema: Secondary | ICD-10-CM

## 2021-10-12 DIAGNOSIS — M6281 Muscle weakness (generalized): Secondary | ICD-10-CM

## 2021-10-12 NOTE — Therapy (Signed)
OUTPATIENT PHYSICAL THERAPY LOWER EXTREMITY TREATMENT   Patient Name: Anna Bradford MRN: [id number] DOB:03-01-1933, 88 y.o., female Today's Date: 10/12/2021   PT End of Session - 10/12/21 1337     Visit Number 8    Number of Visits 12    Date for PT Re-Evaluation 12/16/21    PT Start Time 0100    PT Stop Time 0151    PT Time Calculation (min) 51 min    Activity Tolerance Patient tolerated treatment well    Behavior During Therapy [facility] for tasks assessed/performed               Past Medical History:  Diagnosis Date   Arthritis    Cancer ([place]) 1996   left breast   Essential hypertension, benign    GERD (gastroesophageal reflux disease)    History of kidney stones    Past Surgical History:  Procedure Laterality Date   ABDOMINAL HYSTERECTOMY     Absess Off Intestine     cataract surgery Left 08/10/2011   cataract surgery Right 11/01/2011   CHOLECYSTECTOMY     1972   COLONOSCOPY N/A 02/06/2015   Procedure: COLONOSCOPY;  Surgeon: Rogene Houston, MD;  [facility];  Service: Endoscopy;  Laterality: N/A;  [place]   Right ankle ORIF   GALLBLADDER SURGERY     Rupture Lower Stomach   HERNIA REPAIR     Hysterectomy ---- unknown     1980   Lapoma Tumor     Left Breast Masectomy     1986   MASS EXCISION N/A 01/30/2018   Procedure: EXCISION MASS 3CM ON BACK AND 3CM ON ABDOMEN;  Surgeon: Virl Cagey, MD;  Location: [facility];  Service: General;  Laterality: N/A;   Right Ankle Surgery     TOTAL KNEE ARTHROPLASTY Left 09/09/2021   Procedure: TOTAL KNEE ARTHROPLASTY;  Surgeon: Vickey Huger, MD;  [facility];  Service: Orthopedics;  Laterality: Left;   Patient Active Problem List   Diagnosis Date Noted   Submental adenopathy 10/30/2020   Lipoma of torso 09/09/2020   Osteoarthritis 09/11/2018   Mass of subcutaneous tissue of back 01/19/2018   Mass of soft tissue of abdomen 01/19/2018   Severe obesity (BMI 35.0-39.9) with  comorbidity ([place]) 12/13/2017   Osteoporosis without current pathological fracture 10/01/2015   Varicose veins of lower extremities with other complications 54/00/8676   Essential hypertension, benign 08/27/2011   GERD (gastroesophageal reflux disease) 08/27/2011    REFERRING PROVIDER: Harden Mo MD  REFERRING DIAG: S/p left total knee replacement.  THERAPY DIAG:  Chronic pain of left knee  Muscle weakness (generalized)  Stiffness of left knee, not elsewhere classified  Localized edema  Rationale for Evaluation and Treatment Rehabilitation  ONSET DATE: 09/09/29 (surgery date).  SUBJECTIVE:   SUBJECTIVE STATEMENT: Want to go easy.  I threw my back out. PAIN:  Are you having pain? Yes: NPRS scale: 6/10 Pain location: L knee Pain description: stinging Aggravating factors:   Relieving factors:      OBJECTIVE:  PATIENT SURVEYS:  FOTO Complete.   TODAY'S TREATMENT:                                    EXERCISE LOG  Exercise Repetitions and Resistance Comments  Nustep L3 x 20 min.  Manual:    LAQ's x 3 minutes with 3# f/b  seated PROM to patient's left knee x 3 minutes into flexion f/b vasopneumatic on low x 15 minutes.   ASSESSMENT:  CLINICAL IMPRESSION: Patient c/o low back pain today and requested to go easy.  She did well today and preferred to stay seated for there ex and PROM.  Her left knee range of motion is improving and she exhibited excellent active knee extension today.    Goals reviewed with patient? Yes  SHORT TERM GOALS: Target date: 10/26/2021  Ind with an initial HEP. Baseline: Goal status: INITIAL  2.  Full active left knee extension. Baseline:  Goal status: INITIAL  3.  Active left knee flexion to 100 degrees. Baseline:  Goal status: INITIAL  LONG TERM GOALS: Target date: 11/23/2021   Ind with an advanced HEP. Baseline:  Goal status: INITIAL  2.  Active left knee flexion to 115  degrees+ so the patient can perform functional tasks and do so with pain not > 2-3/10. Baseline:  Goal status: INITIAL  3.  Increase left hip and knee strength to a solid 5/5 to provide good stability for accomplishment of functional activities. Baseline:  Goal status: INITIAL  4.  Perform a reciprocating stair gait with one railing with pain not > 2-3/10. Baseline:  Goal status: INITIAL  PLAN: PT FREQUENCY:  2-3 times a week.  PT DURATION: 6 weeks  PLANNED INTERVENTIONS: Therapeutic exercises, Therapeutic activity, Neuromuscular re-education, Gait training, Patient/Family education, Self Care, Stair training, Electrical stimulation, Cryotherapy, Vasopneumatic device, and Manual therapy  PLAN FOR NEXT SESSION: Progress per TKA protocol.   Beauregard Jarrells, [place], PT 10/12/2021, 3:07 PM

## 2021-10-14 ENCOUNTER — Ambulatory Visit: Payer: Medicare Other | Admitting: Physical Therapy

## 2021-10-14 DIAGNOSIS — M6281 Muscle weakness (generalized): Secondary | ICD-10-CM | POA: Diagnosis not present

## 2021-10-14 DIAGNOSIS — G8929 Other chronic pain: Secondary | ICD-10-CM

## 2021-10-14 DIAGNOSIS — M25662 Stiffness of left knee, not elsewhere classified: Secondary | ICD-10-CM | POA: Diagnosis not present

## 2021-10-14 DIAGNOSIS — M25562 Pain in left knee: Secondary | ICD-10-CM | POA: Diagnosis not present

## 2021-10-14 DIAGNOSIS — R600 Localized edema: Secondary | ICD-10-CM | POA: Diagnosis not present

## 2021-10-14 NOTE — Therapy (Signed)
OUTPATIENT PHYSICAL THERAPY LOWER EXTREMITY TREATMENT   Patient Name: Anna Bradford MRN: [id number] DOB:[birth year], 88 y.o., female Today's Date: 10/14/2021   PT End of Session - 10/14/21 1300     Visit Number 9    Number of Visits 12    Date for PT Re-Evaluation 12/16/21    PT Start Time 7619    Activity Tolerance Patient tolerated treatment well    Behavior During Therapy [facility] for tasks assessed/performed                Past Medical History:  Diagnosis Date   Arthritis    Cancer ([place]) 1996   left breast   Essential hypertension, benign    GERD (gastroesophageal reflux disease)    History of kidney stones    Past Surgical History:  Procedure Laterality Date   ABDOMINAL HYSTERECTOMY     Absess Off Intestine     cataract surgery Left 08/10/2011   cataract surgery Right 11/01/2011   CHOLECYSTECTOMY     1972   COLONOSCOPY N/A 02/06/2015   Procedure: COLONOSCOPY;  Surgeon: Rogene Houston, MD;  [facility];  Service: Endoscopy;  Laterality: N/A;  [place]   Right ankle ORIF   GALLBLADDER SURGERY     Rupture Lower Stomach   HERNIA REPAIR     Hysterectomy ---- unknown     1980   Lapoma Tumor     Left Breast Masectomy     1986   MASS EXCISION N/A 01/30/2018   Procedure: EXCISION MASS 3CM ON BACK AND 3CM ON ABDOMEN;  Surgeon: Virl Cagey, MD;  Location: [facility];  Service: General;  Laterality: N/A;   Right Ankle Surgery     TOTAL KNEE ARTHROPLASTY Left 09/09/2021   Procedure: TOTAL KNEE ARTHROPLASTY;  Surgeon: Vickey Huger, MD;  [facility];  Service: Orthopedics;  Laterality: Left;   Patient Active Problem List   Diagnosis Date Noted   Submental adenopathy 10/30/2020   Lipoma of torso 09/09/2020   Osteoarthritis 09/11/2018   Mass of subcutaneous tissue of back 01/19/2018   Mass of soft tissue of abdomen 01/19/2018   Severe obesity (BMI 35.0-39.9) with comorbidity ([place]) 12/13/2017   Osteoporosis without current  pathological fracture 10/01/2015   Varicose veins of lower extremities with other complications 50/93/2671   Essential hypertension, benign 08/27/2011   GERD (gastroesophageal reflux disease) 08/27/2011    REFERRING PROVIDER: Harden Mo MD  REFERRING DIAG: S/p left total knee replacement.  THERAPY DIAG:  Chronic pain of left knee  Muscle weakness (generalized)  Stiffness of left knee, not elsewhere classified  Localized edema  Rationale for Evaluation and Treatment Rehabilitation  ONSET DATE: 09/09/29 (surgery date).  SUBJECTIVE:   SUBJECTIVE STATEMENT: Back bothering patient more than knee.  Knee pain low today and patient pleased with progress. PAIN:  Are you having pain? Yes: NPRS scale: 2/10 Pain location: L knee Pain description: stinging Aggravating factors:   Relieving factors:      OBJECTIVE:  PATIENT SURVEYS:  FOTO Complete.   TODAY'S TREATMENT: Nustep level 3 x 15 minutes moving seat forward x 2 to increase flexion f/b rockerboard in parallel bars x 3 minutes f/b 14 incj box lunges with 2 minutes f/b 3# SAQ's x 3 minutes f/b PROM x 5 minutes f/b vasopneumatic on low x 15 minutes.      ASSESSMENT:  CLINICAL IMPRESSION: Patient continues to have problems with her back.  Patient is very is pleased with her  progress.  Her knee is feeling good today with a low pain-level today.  Her knee range of motion is improving nicely and she achieved full left knee extension and passive flexion to 105 degrees.     Goals reviewed with patient? Yes  SHORT TERM GOALS: Target date: 10/28/2021  Ind with an initial HEP. Baseline: Goal status: INITIAL  2.  Full active left knee extension. Baseline:  Goal status: INITIAL  3.  Active left knee flexion to 100 degrees. Baseline:  Goal status: INITIAL  LONG TERM GOALS: Target date: 11/25/2021   Ind with an advanced HEP. Baseline:  Goal status: INITIAL  2.  Active left knee flexion to 115 degrees+ so the patient can  perform functional tasks and do so with pain not > 2-3/10. Baseline:  Goal status: INITIAL  3.  Increase left hip and knee strength to a solid 5/5 to provide good stability for accomplishment of functional activities. Baseline:  Goal status: INITIAL  4.  Perform a reciprocating stair gait with one railing with pain not > 2-3/10. Baseline:  Goal status: INITIAL  PLAN: PT FREQUENCY:  2-3 times a week.  PT DURATION: 6 weeks  PLANNED INTERVENTIONS: Therapeutic exercises, Therapeutic activity, Neuromuscular re-education, Gait training, Patient/Family education, Self Care, Stair training, Electrical stimulation, Cryotherapy, Vasopneumatic device, and Manual therapy  PLAN FOR NEXT SESSION: Progress per TKA protocol.   Darlisa Spruiell, [place], PT 10/14/2021, 1:38 PM

## 2021-10-15 ENCOUNTER — Other Ambulatory Visit: Payer: Self-pay | Admitting: Family Medicine

## 2021-10-15 DIAGNOSIS — I10 Essential (primary) hypertension: Secondary | ICD-10-CM

## 2021-10-16 ENCOUNTER — Ambulatory Visit: Payer: Medicare Other

## 2021-10-16 DIAGNOSIS — M6281 Muscle weakness (generalized): Secondary | ICD-10-CM

## 2021-10-16 DIAGNOSIS — M25662 Stiffness of left knee, not elsewhere classified: Secondary | ICD-10-CM | POA: Diagnosis not present

## 2021-10-16 DIAGNOSIS — M25562 Pain in left knee: Secondary | ICD-10-CM | POA: Diagnosis not present

## 2021-10-16 DIAGNOSIS — R600 Localized edema: Secondary | ICD-10-CM

## 2021-10-16 DIAGNOSIS — G8929 Other chronic pain: Secondary | ICD-10-CM

## 2021-10-16 NOTE — Therapy (Signed)
OUTPATIENT PHYSICAL THERAPY LOWER EXTREMITY TREATMENT   Patient Name: Anna Bradford MRN: [id number] DOB:1933-09-08, 88 y.o., female Today's Date: 10/16/2021   PT End of Session - 10/16/21 1124     Visit Number 10    Number of Visits 12    Date for PT Re-Evaluation 12/16/21    PT Start Time 1116    PT Stop Time 1155    PT Time Calculation (min) 39 min    Activity Tolerance Patient tolerated treatment well    Behavior During Therapy [facility] for tasks assessed/performed                 Past Medical History:  Diagnosis Date   Arthritis    Cancer ([place]) 1996   left breast   Essential hypertension, benign    GERD (gastroesophageal reflux disease)    History of kidney stones    Past Surgical History:  Procedure Laterality Date   ABDOMINAL HYSTERECTOMY     Absess Off Intestine     cataract surgery Left 08/10/2011   cataract surgery Right 11/01/2011   CHOLECYSTECTOMY     1972   COLONOSCOPY N/A 02/06/2015   Procedure: COLONOSCOPY;  Surgeon: Rogene Houston, MD;  [facility];  Service: Endoscopy;  Laterality: N/A;  [place]   Right ankle ORIF   GALLBLADDER SURGERY     Rupture Lower Stomach   HERNIA REPAIR     Hysterectomy ---- unknown     1980   Lapoma Tumor     Left Breast Masectomy     1986   MASS EXCISION N/A 01/30/2018   Procedure: EXCISION MASS 3CM ON BACK AND 3CM ON ABDOMEN;  Surgeon: Virl Cagey, MD;  Location: [facility];  Service: General;  Laterality: N/A;   Right Ankle Surgery     TOTAL KNEE ARTHROPLASTY Left 09/09/2021   Procedure: TOTAL KNEE ARTHROPLASTY;  Surgeon: Vickey Huger, MD;  [facility];  Service: Orthopedics;  Laterality: Left;   Patient Active Problem List   Diagnosis Date Noted   Submental adenopathy 10/30/2020   Lipoma of torso 09/09/2020   Osteoarthritis 09/11/2018   Mass of subcutaneous tissue of back 01/19/2018   Mass of soft tissue of abdomen 01/19/2018   Severe obesity (BMI 35.0-39.9) with  comorbidity ([place]) 12/13/2017   Osteoporosis without current pathological fracture 10/01/2015   Varicose veins of lower extremities with other complications 80/99/8338   Essential hypertension, benign 08/27/2011   GERD (gastroesophageal reflux disease) 08/27/2011    REFERRING PROVIDER: Harden Mo MD  REFERRING DIAG: S/p left total knee replacement.  THERAPY DIAG:  Chronic pain of left knee  Muscle weakness (generalized)  Stiffness of left knee, not elsewhere classified  Localized edema  Rationale for Evaluation and Treatment Rehabilitation  ONSET DATE: 09/09/29 (surgery date).  SUBJECTIVE:   SUBJECTIVE STATEMENT: Back bothering patient more than knee.  Knee pain low today and patient pleased with progress. PAIN:  Are you having pain? Yes: NPRS scale: 2/10 Pain location: L knee Pain description: stinging Aggravating factors:   Relieving factors:      OBJECTIVE:  PATIENT SURVEYS:  FOTO Complete.   TODAY'S TREATMENT:                                   9/29 EXERCISE LOG  Exercise Repetitions and Resistance Comments  Nustep L3 x 15 minutes   Seated hamstring stretch 4 x  30 seconds    LAQ 30 reps   Seated gastroc stretch 3 x 30 seconds   Lunges onto step 2 minutes    Blank cell = exercise not performed today    ASSESSMENT:  CLINICAL IMPRESSION: Patient has made fair progress with skilled physical therapy as evidenced by her subjective reports, objective reports, and progress toward her goals. She was unable to meet her left knee AROM for functional goals due to weakness and stiffness. Her HEP was reviewed and updated and she was able to properly demonstrate these interventions with minimal cueing. She requested to be placed on hold with her updated HEP at this time. She would benefit from continued physical therapy to improve her AROM and functional mobility to return to her prior level of function.    Goals reviewed with patient? Yes  SHORT TERM GOALS: Target  date: 10/30/2021  Ind with an initial HEP. Baseline: Goal status: MET  2.  Full active left knee extension. Baseline: 18 degrees from neutral (measured in sitting)  Goal status: IN PROGRESS  3.  Active left knee flexion to 100 degrees. Baseline: 95 degrees Goal status: IN PROGRESS  LONG TERM GOALS: Target date: 11/27/2021   Ind with an advanced HEP. Baseline:  Goal status: MET  2.  Active left knee flexion to 115 degrees+ so the patient can perform functional tasks and do so with pain not > 2-3/10. Baseline:  Goal status: IN PROGRESS  3.  Increase left hip and knee strength to a solid 5/5 to provide good stability for accomplishment of functional activities. Baseline: 4/5 with left knee flexion and extension Goal status: IN PROGRESS  4.  Perform a reciprocating stair gait with one railing with pain not > 2-3/10. Baseline: step to pattern ascending and descending steps Goal status: IN PROGRESS  PLAN: PT FREQUENCY:  2-3 times a week.  PT DURATION: 6 weeks  PLANNED INTERVENTIONS: Therapeutic exercises, Therapeutic activity, Neuromuscular re-education, Gait training, Patient/Family education, Self Care, Stair training, Electrical stimulation, Cryotherapy, Vasopneumatic device, and Manual therapy  PLAN FOR NEXT SESSION: Progress per TKA protocol.   Darlin Coco, PT 10/16/2021, 12:25 PM

## 2021-10-29 ENCOUNTER — Encounter: Payer: Self-pay | Admitting: Family Medicine

## 2021-10-29 ENCOUNTER — Ambulatory Visit (INDEPENDENT_AMBULATORY_CARE_PROVIDER_SITE_OTHER): Payer: Medicare Other | Admitting: Family Medicine

## 2021-10-29 VITALS — BP 146/78 | HR 90 | Temp 98.0°F | Ht 64.0 in | Wt 226.0 lb

## 2021-10-29 DIAGNOSIS — N6312 Unspecified lump in the right breast, upper inner quadrant: Secondary | ICD-10-CM | POA: Diagnosis not present

## 2021-10-29 DIAGNOSIS — M545 Low back pain, unspecified: Secondary | ICD-10-CM | POA: Diagnosis not present

## 2021-10-29 DIAGNOSIS — M4716 Other spondylosis with myelopathy, lumbar region: Secondary | ICD-10-CM

## 2021-10-29 LAB — URINALYSIS
Bilirubin, UA: NEGATIVE
Glucose, UA: NEGATIVE
Ketones, UA: NEGATIVE
Nitrite, UA: NEGATIVE
Protein,UA: NEGATIVE
Specific Gravity, UA: 1.01 (ref 1.005–1.030)
Urobilinogen, Ur: 0.2 mg/dL (ref 0.2–1.0)
pH, UA: 7 (ref 5.0–7.5)

## 2021-10-29 NOTE — Progress Notes (Signed)
BP (!) 146/78   Pulse 90   Temp 98 F (36.7 C)   Ht '5\' 4"'$  (1.626 m)   Wt 226 lb (102.5 kg)   SpO2 97%   BMI 38.79 kg/m    Subjective:   Patient ID: Anna Bradford, female    DOB: 12/23/1933, 88 y.o.   MRN: [id number]  HPI: Anna Bradford is a 88 y.o. female presenting on 10/29/2021 for Back Pain, Breast Mass (Right sided- [facility] breast cancer), and S/P left knee replacement- open site oozing clear   HPI Back pain Patient has complained of continued back pain in her lower back and mid back that radiates around to her groin on the right side sometimes down the back of her leg.  She says the pain has been worsening over the past month or 2.  She did have an MRI in March that showed spondylosis and possible nerve root impingement with bulging disks.  She would like to go see a spinal specialist now.  Breast lump Patient has a right upper breast lump that is slightly tender that she has noticed over the past few weeks at least.  She has a history of breast cancer many years ago.  She wants to get it checked out.  She has not had a mammogram in 2 or 3 years because of age.  Patient has oozing from 2 incisional sites with clear drainage.  She is working with the surgeon to get that healed.  Relevant past medical, surgical, family and social history reviewed and updated as indicated. Interim medical history since our last visit reviewed. Allergies and medications reviewed and updated.  Review of Systems  Constitutional:  Negative for chills and fever.  Eyes:  Negative for redness and visual disturbance.  Respiratory:  Negative for chest tightness and shortness of breath.   Cardiovascular:  Negative for chest pain and leg swelling.  Musculoskeletal:  Positive for arthralgias and back pain. Negative for gait problem.  Skin:  Positive for wound. Negative for rash.  Neurological:  Negative for light-headedness and headaches.  Psychiatric/Behavioral:  Negative for agitation and behavioral  problems.   All other systems reviewed and are negative.   Per HPI unless specifically indicated above   Allergies as of 10/29/2021       Reactions   Levofloxacin    Other (See Comments) tendonitis   Nitrofurantoin    Other reaction(s): Other (See Comments) Bleeding thru bowels   Macrolides And Ketolides    Bleeding thru bowels   Codeine Nausea And Vomiting   Sulfa Antibiotics Nausea And Vomiting        Medication List        Accurate as of October 29, 2021  2:11 PM. If you have any questions, ask your nurse or doctor.          STOP taking these medications    oxyCODONE 5 MG immediate release tablet Commonly known as: Roxicodone Stopped by: Fransisca Kaufmann Fortunata Audray, MD       TAKE these medications    acetaminophen 325 MG tablet Commonly known as: TYLENOL Take 650 mg by mouth daily with lunch.   acetaminophen 500 MG tablet Commonly known as: TYLENOL Take 2 tablets (1,000 mg total) by mouth every 6 (six) hours.   albuterol 108 (90 Base) MCG/ACT inhaler Commonly known as: VENTOLIN HFA Inhale 2 puffs into the lungs every 6 (six) hours as needed for wheezing or shortness of breath.   amitriptyline 50 MG tablet Commonly known as:  ELAVIL Take 1 tablet (50 mg total) by mouth at bedtime.   amLODipine 10 MG tablet Commonly known as: NORVASC TAKE 1 TABLET BY MOUTH DAILY   baclofen 10 MG tablet Commonly known as: LIORESAL Take 1 tablet at bedtime as needed for muscle spasms.   CALCIUM-MAGNESIUM-ZINC-D3 PO Take 1 tablet by mouth daily.   clobetasol cream 0.05 % Commonly known as: TEMOVATE Apply topically 2 (two) times daily.   diphenhydrAMINE 25 MG tablet Commonly known as: BENADRYL Take 25 mg by mouth daily as needed for allergies.   famotidine 20 MG tablet Commonly known as: Pepcid Take 1 tablet (20 mg total) by mouth 2 (two) times daily.   losartan-hydrochlorothiazide 100-25 MG tablet Commonly known as: HYZAAR TAKE 1 TABLET BY MOUTH DAILY    magnesium hydroxide 400 MG/5ML suspension Commonly known as: MILK OF MAGNESIA Take 15 mLs by mouth every other day.   multivitamin tablet Take 1 tablet by mouth daily.   nystatin cream Commonly known as: MYCOSTATIN Apply 1 application  topically daily as needed for dry skin.   Potassium 99 MG Tabs Take 99 mg by mouth daily.   rOPINIRole 2 MG tablet Commonly known as: REQUIP TAKE ONE TABLET AT BEDTIME FOR LEG CRAMPS   SYSTANE OP Place 1 drop into both eyes daily as needed (dry eyes).   triamcinolone cream 0.1 % Commonly known as: KENALOG Apply 1 application topically 2 (two) times daily. What changed:  when to take this reasons to take this   vitamin C 1000 MG tablet Take 1,000 mg by mouth daily.   Vitamin D-3 25 MCG (1000 UT) Caps Take 2,000 Units by mouth daily.   zinc gluconate 50 MG tablet Take 50 mg by mouth daily.         Objective:   BP (!) 146/78   Pulse 90   Temp 98 F (36.7 C)   Ht '5\' 4"'$  (1.626 m)   Wt 226 lb (102.5 kg)   SpO2 97%   BMI 38.79 kg/m   Wt Readings from Last 3 Encounters:  10/29/21 226 lb (102.5 kg)  09/09/21 224 lb (101.6 kg)  08/31/21 224 lb (101.6 kg)    Physical Exam Vitals and nursing note reviewed. Exam conducted with a chaperone present.  Constitutional:      General: She is not in acute distress.    Appearance: She is well-developed. She is not diaphoretic.  Eyes:     Conjunctiva/sclera: Conjunctivae normal.     Pupils: Pupils are equal, round, and reactive to light.  Cardiovascular:     Rate and Rhythm: Normal rate and regular rhythm.     Heart sounds: Normal heart sounds. No murmur heard. Pulmonary:     Effort: Pulmonary effort is normal. No respiratory distress.     Breath sounds: Normal breath sounds. No wheezing.  Chest:    Musculoskeletal:        General: Normal range of motion.     Lumbar back: Spasms and tenderness present. No bony tenderness. No scoliosis.  Skin:    General: Skin is warm and dry.      Findings: No rash.  Neurological:     Mental Status: She is alert and oriented to person, place, and time.     Coordination: Coordination normal.  Psychiatric:        Behavior: Behavior normal.       Assessment & Plan:   Problem List Items Addressed This Visit   None Visit Diagnoses     Lumbar  spondylosis with myelopathy    -  Primary   Relevant Orders   Ambulatory referral to Neurosurgery   Acute midline low back pain without sciatica       Relevant Orders   Urine Culture   Urinalysis   Ambulatory referral to Neurosurgery   Mass of upper inner quadrant of right breast       Relevant Orders   MM DIAG BREAST TOMO BILATERAL       We will have patient go into another mammogram with new breast lump on upper inner quadrant, near the incision site of her previous lumpectomy. Follow up plan: No follow-ups on file.  Counseling provided for all of the vaccine components Orders Placed This Encounter  Procedures   Urine Culture   MM DIAG BREAST TOMO BILATERAL   Urinalysis   Ambulatory referral to Neurosurgery    Caryl Pina, MD [facility] 10/29/2021, 2:11 PM

## 2021-10-30 ENCOUNTER — Other Ambulatory Visit: Payer: Self-pay

## 2021-10-30 DIAGNOSIS — N6312 Unspecified lump in the right breast, upper inner quadrant: Secondary | ICD-10-CM

## 2021-11-01 LAB — URINE CULTURE

## 2021-11-02 MED ORDER — CEPHALEXIN 500 MG PO CAPS: 500.0000 mg | ORAL_CAPSULE | Freq: Four times a day (QID) | ORAL | 0 refills | Status: DC

## 2021-11-02 NOTE — Progress Notes (Signed)
Urine culture grew bacteria, sent Keflex

## 2021-11-02 NOTE — Addendum Note (Signed)
Addended by: Caryl Pina on: 11/02/2021 08:05 AM   Modules accepted: Orders

## 2021-11-04 ENCOUNTER — Other Ambulatory Visit (HOSPITAL_COMMUNITY): Payer: Self-pay | Admitting: Nurse Practitioner

## 2021-11-12 ENCOUNTER — Other Ambulatory Visit (HOSPITAL_COMMUNITY): Payer: Medicare Other

## 2021-11-12 ENCOUNTER — Ambulatory Visit (HOSPITAL_COMMUNITY)
Admission: RE | Admit: 2021-11-12 | Discharge: 2021-11-12 | Disposition: A | Discharge status: Home / Self Care | Payer: Medicare Other | Source: Ambulatory Visit | Attending: Family Medicine | Admitting: Family Medicine

## 2021-11-12 DIAGNOSIS — R92321 Mammographic fibroglandular density, right breast: Secondary | ICD-10-CM | POA: Diagnosis not present

## 2021-11-12 DIAGNOSIS — N6312 Unspecified lump in the right breast, upper inner quadrant: Secondary | ICD-10-CM

## 2021-12-14 ENCOUNTER — Ambulatory Visit (INDEPENDENT_AMBULATORY_CARE_PROVIDER_SITE_OTHER): Payer: Medicare Other

## 2021-12-14 VITALS — Ht 65.0 in | Wt 220.0 lb

## 2021-12-14 DIAGNOSIS — Z0000 Encounter for general adult medical examination without abnormal findings: Secondary | ICD-10-CM | POA: Diagnosis not present

## 2021-12-14 NOTE — Progress Notes (Signed)
Subjective:   Anna Bradford is a 88 y.o. female who presents for Medicare Annual (Subsequent) preventive examination. I connected with  [name] on 12/14/21 by a audio enabled telemedicine application and verified that I am speaking with the correct person using two identifiers.  Patient Location: Home  Provider Location: Home Office  I discussed the limitations of evaluation and management by telemedicine. The patient expressed understanding and agreed to proceed.  Review of Systems     Cardiac Risk Factors include: advanced age (>6mn, >>52women);hypertension     Objective:    Today's Vitals   12/14/21 1406  Weight: 220 lb (99.8 kg)  Height: '5\' 5"'$  (1.651 m)   Body mass index is 36.61 kg/m.     12/14/2021    2:11 PM 09/17/2021    1:44 PM 08/31/2021   11:04 AM 11/10/2020    1:40 PM 04/24/2020   10:41 PM 04/20/2020    5:59 PM 11/09/2019   11:33 AM  Advanced Directives  Does Patient Have a Medical Advance Directive? No No No Yes No No No  Type of AScientist, research (medical)Living will     Copy of [place]?    No - copy requested     Would patient like information on creating a medical advance directive? No - Patient declined  No - Patient declined  No - Patient declined  No - Patient declined    Current Medications (verified) Outpatient Encounter Medications as of 12/14/2021  Medication Sig   acetaminophen (TYLENOL) 325 MG tablet Take 650 mg by mouth daily with lunch.   acetaminophen (TYLENOL) 500 MG tablet Take 2 tablets (1,000 mg total) by mouth every 6 (six) hours.   albuterol (VENTOLIN HFA) 108 (90 Base) MCG/ACT inhaler Inhale 2 puffs into the lungs every 6 (six) hours as needed for wheezing or shortness of breath.   amitriptyline (ELAVIL) 50 MG tablet Take 1 tablet (50 mg total) by mouth at bedtime.   amLODipine (NORVASC) 10 MG tablet TAKE 1 TABLET BY MOUTH DAILY   Ascorbic Acid (VITAMIN C) 1000 MG tablet Take  1,000 mg by mouth daily.    baclofen (LIORESAL) 10 MG tablet Take 1 tablet at bedtime as needed for muscle spasms.   cephALEXin (KEFLEX) 500 MG capsule Take 1 capsule (500 mg total) by mouth 4 (four) times daily.   Cholecalciferol (VITAMIN D-3) 1000 UNITS CAPS Take 2,000 Units by mouth daily.   clobetasol cream (TEMOVATE) 0.05 % Apply topically 2 (two) times daily.   diphenhydrAMINE (BENADRYL) 25 MG tablet Take 25 mg by mouth daily as needed for allergies.   famotidine (PEPCID) 20 MG tablet Take 1 tablet (20 mg total) by mouth 2 (two) times daily.   losartan-hydrochlorothiazide (HYZAAR) 100-25 MG tablet TAKE 1 TABLET BY MOUTH DAILY   magnesium hydroxide (MILK OF MAGNESIA) 400 MG/5ML suspension Take 15 mLs by mouth every other day.   methocarbamol (ROBAXIN) 500 MG tablet Take 500 mg by mouth 4 (four) times daily.   Multiple Minerals-Vitamins (CALCIUM-MAGNESIUM-ZINC-D3 PO) Take 1 tablet by mouth daily.   Multiple Vitamin (MULTIVITAMIN) tablet Take 1 tablet by mouth daily.   nystatin cream (MYCOSTATIN) Apply 1 application  topically daily as needed for dry skin.   Polyethyl Glycol-Propyl Glycol (SYSTANE OP) Place 1 drop into both eyes daily as needed (dry eyes).   Potassium 99 MG TABS Take 99 mg by mouth daily.   rOPINIRole (REQUIP) 2 MG tablet  TAKE ONE TABLET AT BEDTIME FOR LEG CRAMPS   triamcinolone cream (KENALOG) 0.1 % Apply 1 application topically 2 (two) times daily. (Patient taking differently: Apply 1 application  topically daily as needed (irritation/itching).)   zinc gluconate 50 MG tablet Take 50 mg by mouth daily.   No facility-administered encounter medications on file as of 12/14/2021.    Allergies (verified) Levofloxacin, Nitrofurantoin, Macrolides and ketolides, Codeine, and Sulfa antibiotics   History: Past Medical History:  Diagnosis Date   Arthritis    Cancer ([place]) 1996   left breast   Essential hypertension, benign    GERD (gastroesophageal reflux disease)    History  of kidney stones    Past Surgical History:  Procedure Laterality Date   ABDOMINAL HYSTERECTOMY     Absess Off Intestine     cataract surgery Left 08/10/2011   cataract surgery Right 11/01/2011   CHOLECYSTECTOMY     1972   COLONOSCOPY N/A 02/06/2015   Procedure: COLONOSCOPY;  Surgeon: Rogene Houston, MD;  Location: [facility];  Service: Endoscopy;  Laterality: N/A;  [place]   Right ankle ORIF   GALLBLADDER SURGERY     Rupture Lower Stomach   HERNIA REPAIR     Hysterectomy ---- unknown     1980   Lapoma Tumor     Left Breast Masectomy     1986   MASS EXCISION N/A 01/30/2018   Procedure: EXCISION MASS 3CM ON BACK AND 3CM ON ABDOMEN;  Surgeon: Virl Cagey, MD;  Location: [facility]S;  Service: General;  Laterality: N/A;   Right Ankle Surgery     TOTAL KNEE ARTHROPLASTY Left 09/09/2021   Procedure: TOTAL KNEE ARTHROPLASTY;  Surgeon: Vickey Huger, MD;  Location: [facility];  Service: Orthopedics;  Laterality: Left;   Family History  Problem Relation Age of Onset   Cancer Mother    Coronary artery disease Mother    Heart disease Sister    Social History   Socioeconomic History   Marital status: Widowed    Spouse name: Not on file   Number of children: 2   Years of education: 12-failed the 4th grade and had to repeat   Highest education level: 11th grade  Occupational History   Occupation: retired  Tobacco Use   Smoking status: Never   Smokeless tobacco: Never  Vaping Use   Vaping Use: Never used  Substance and Sexual Activity   Alcohol use: No   Drug use: No   Sexual activity: Not Currently    Birth control/protection: Surgical  Other Topics Concern   Not on file  Social History Narrative   Not on file   Social Determinants of Health   Financial Resource Strain: Low Risk  (12/14/2021)   Overall Financial Resource Strain (CARDIA)    Difficulty of Paying Living Expenses: Not hard at all  Food Insecurity: No Food Insecurity (12/14/2021)    Hunger Vital Sign    Worried About Running Out of Food in the Last Year: Never true    [place] in the Last Year: Never true  Transportation Needs: No Transportation Needs (12/14/2021)   PRAPARE - Hydrologist (Medical): No    Lack of Transportation (Non-Medical): No  Physical Activity: Inactive (12/14/2021)   Exercise Vital Sign    Days of Exercise per Week: 0 days    Minutes of Exercise per Session: 0 min  Stress: No Stress Concern Present (12/14/2021)  [place] Questionnaire    Feeling of Stress : Only a little  Social Connections: Moderately Isolated (12/14/2021)   Social Connection and Isolation Panel [NHANES]    Frequency of Communication with Friends and Family: More than three times a week    Frequency of Social Gatherings with Friends and Family: More than three times a week    Attends Religious Services: 1 to 4 times per year    Active Member of [employer] or Organizations: No    Attends Archivist Meetings: Never    Marital Status: Widowed    Tobacco Counseling Counseling given: Not Answered   Clinical Intake:  Pre-visit preparation completed: Yes  Pain : No/denies pain     Nutritional Risks: None Diabetes: No  How often do you need to have someone help you when you read instructions, pamphlets, or other written materials from your doctor or pharmacy?: 1 - Never  Diabetic?no   Interpreter Needed?: No  Information entered by :: Jadene Pierini, LPN   Activities of Daily Living    12/14/2021    2:11 PM 08/31/2021   11:10 AM  In your present state of health, do you have any difficulty performing the following activities:  Hearing? 0   Vision? 0   Difficulty concentrating or making decisions? 0   Walking or climbing stairs? 0   Dressing or bathing? 0   Doing errands, shopping? 0 0  Preparing Food and eating ? N   Using the Toilet? N   In the past six months,  have you accidently leaked urine? N   Do you have problems with loss of bowel control? N   Managing your Medications? N   Managing your Finances? N   Housekeeping or managing your Housekeeping? N     Patient Care Team: Dettinger, Fransisca Kaufmann, MD as PCP - General (Family Medicine)  Indicate any recent Medical Services you may have received from other than [facility] providers in the past year (date may be approximate).     Assessment:   This is a routine wellness examination for Aileen.  Hearing/Vision screen Vision Screening - Comments:: Wears rx glasses - up to date with routine eye exams with  Dr.Johnson   Dietary issues and exercise activities discussed: Current Exercise Habits: The patient does not participate in regular exercise at present, Exercise limited by: orthopedic condition(s)   Goals Addressed             This Visit's Progress    DIET - INCREASE WATER INTAKE   On track    Try to drink 6-8 glasses of water daily.       Depression Screen    12/14/2021    2:09 PM 10/29/2021    1:39 PM 07/22/2021    8:07 AM 07/15/2021    9:17 AM 06/18/2021    4:35 PM 05/07/2021   10:55 AM 01/30/2021   10:14 AM  PHQ 2/9 Scores  PHQ - 2 Score 0 0 0 0 0 0 0  PHQ- 9 Score   0  0 0     Fall Risk    12/14/2021    2:06 PM 10/29/2021    1:39 PM 07/22/2021    8:07 AM 07/15/2021    9:17 AM 06/18/2021    4:35 PM  Banks in the past year? 1 0 0 0 0  Number falls in past yr: 1    0  Injury with Fall? 1  0  Risk for fall due to : History of fall(s);Impaired balance/gait;Orthopedic patient    Impaired balance/gait  Follow up Education provided;Falls prevention discussed    Falls evaluation completed    FALL RISK PREVENTION PERTAINING TO THE HOME:  Any stairs in or around the home? Yes  If so, are there any without handrails? No  Home free of loose throw rugs in walkways, pet beds, electrical cords, etc? Yes  Adequate lighting in your home to reduce risk of falls? Yes    ASSISTIVE DEVICES UTILIZED TO PREVENT FALLS:  Life alert? Yes  Use of a cane, walker or w/c? Yes  Grab bars in the bathroom? Yes  Shower chair or bench in shower? No  Elevated toilet seat or a handicapped toilet? No          12/14/2021    2:11 PM 11/09/2019   11:34 AM 11/07/2018    1:45 PM  6CIT Screen  What Year? 0 points 0 points 0 points  What month? 0 points 0 points 0 points  What time? 0 points 0 points 0 points  Count back from 20 0 points 0 points 0 points  Months in reverse 0 points 0 points 0 points  Repeat phrase 0 points 0 points 0 points  Total Score 0 points 0 points 0 points    Immunizations Immunization History  Administered Date(s) Administered   Fluad Quad(high Dose 65+) 10/30/2020   Influenza Inj Mdck Quad With Preservative 12/07/2019   Influenza Split 11/09/2016   Influenza, High Dose Seasonal PF 12/03/2015, 11/28/2017   Influenza, Seasonal, Injecte, Preservative Fre 10/23/2014   Influenza,inj,Quad PF,6+ Mos 10/27/2018   Influenza,trivalent, recombinat, inj, PF 10/22/2016   Influenza-Unspecified 12/08/2019, 10/30/2020   Moderna Sars-Covid-2 Vaccination 02/27/2019, 04/03/2019   Pneumococcal Conjugate-13 12/16/2014   Pneumococcal Polysaccharide-23 11/14/2004   Tdap 07/03/2012   Zoster Recombinat (Shingrix) 01/30/2021, 05/07/2021    TDAP status: Due, Education has been provided regarding the importance of this vaccine. Advised may receive this vaccine at local pharmacy or Health Dept. Aware to provide a copy of the vaccination record if obtained from local pharmacy or Health Dept. Verbalized acceptance and understanding.  Flu Vaccine status: Due, Education has been provided regarding the importance of this vaccine. Advised may receive this vaccine at local pharmacy or Health Dept. Aware to provide a copy of the vaccination record if obtained from local pharmacy or Health Dept. Verbalized acceptance and understanding.  Pneumococcal vaccine status: Up  to date  Covid-19 vaccine status: Completed vaccines  Qualifies for Shingles Vaccine? Yes   Zostavax completed Yes   Shingrix Completed?: Yes  Screening Tests Health Maintenance  Topic Date Due   COVID-19 Vaccine (3 - 2023-24 season) 09/18/2021   INFLUENZA VACCINE  04/18/2022 (Originally 08/18/2021)   Medicare Annual Wellness (AWV)  12/15/2022   Pneumonia Vaccine 70+ Years old  Completed   DEXA SCAN  Completed   Zoster Vaccines- Shingrix  Completed   HPV VACCINES  Aged Out    Health Maintenance  Health Maintenance Due  Topic Date Due   COVID-19 Vaccine (3 - 2023-24 season) 09/18/2021    Colorectal cancer screening: No longer required.   Mammogram status: No longer required due to age.  Bone Density status: Completed 12/24/2019. Results reflect: Bone density results: OSTEOPENIA. Repeat every 5 years.  Lung Cancer Screening: (Low Dose CT Chest recommended if Age 20-80 years, 30 pack-year currently smoking OR have quit w/in 15years.) does not qualify.   Lung Cancer Screening Referral: n/a  Additional  Screening:  Hepatitis C Screening: does not qualify;   Vision Screening: Recommended annual ophthalmology exams for early detection of glaucoma and other disorders of the eye. Is the patient up to date with their annual eye exam?  Yes  Who is the provider or what is the name of the office in which the patient attends annual eye exams? Dr.Johnson  If pt is not established with a provider, would they like to be referred to a provider to establish care? No .   Dental Screening: Recommended annual dental exams for proper oral hygiene  Community Resource Referral / Chronic Care Management: CRR required this visit?  No   CCM required this visit?  No      Plan:     I have personally reviewed and noted the following in the patient's chart:   Medical and social history Use of alcohol, tobacco or illicit drugs  Current medications and supplements including opioid  prescriptions. Patient is not currently taking opioid prescriptions. Functional ability and status Nutritional status Physical activity Advanced directives List of other physicians Hospitalizations, surgeries, and ER visits in previous 12 months Vitals Screenings to include cognitive, depression, and falls Referrals and appointments  In addition, I have reviewed and discussed with patient certain preventive protocols, quality metrics, and best practice recommendations. A written personalized care plan for preventive services as well as general preventive health recommendations were provided to patient.     Daphane Shepherd, LPN   [id number]   Nurse Notes: none

## 2021-12-14 NOTE — Patient Instructions (Signed)
[name] , Thank you for taking time to come for your Medicare Wellness Visit. I appreciate your ongoing commitment to your health goals. Please review the following plan we discussed and let me know if I can assist you in the future.   These are the goals we discussed:  Goals      DIET - INCREASE WATER INTAKE     Try to drink 6-8 glasses of water daily.     Exercise 150 min/wk Moderate Activity     11/09/2019 AWV Goal: Exercise for General Health  Patient will verbalize understanding of the benefits of increased physical activity: Exercising regularly is important. It will improve your overall fitness, flexibility, and endurance. Regular exercise also will improve your overall health. It can help you control your weight, reduce stress, and improve your bone density. Over the next year, patient will increase physical activity as tolerated with a goal of at least 150 minutes of moderate physical activity per week.  You can tell that you are exercising at a moderate intensity if your heart starts beating faster and you start breathing faster but can still hold a conversation. Moderate-intensity exercise ideas include: Walking 1 mile (1.6 km) in about 15 minutes Biking Hiking Golfing Dancing Water aerobics Patient will verbalize understanding of everyday activities that increase physical activity by providing examples like the following: Yard work, such as: Sales promotion account executive Gardening Washing windows or floors Patient will be able to explain general safety guidelines for exercising:  Before you start a new exercise program, talk with your health care provider. Do not exercise so much that you hurt yourself, feel dizzy, or get very short of breath. Wear comfortable clothes and wear shoes with good support. Drink plenty of water while you exercise to prevent dehydration or heat stroke. Work out until  your breathing and your heartbeat get faster.         This is a list of the screening recommended for you and due dates:  Health Maintenance  Topic Date Due   COVID-19 Vaccine (3 - 2023-24 season) 09/18/2021   Flu Shot  04/18/2022*   Medicare Annual Wellness Visit  12/15/2022   Pneumonia Vaccine  Completed   DEXA scan (bone density measurement)  Completed   Zoster (Shingles) Vaccine  Completed   HPV Vaccine  Aged Out  *Topic was postponed. The date shown is not the original due date.    Advanced directives: Advance directive discussed with you today. I have provided a copy for you to complete at home and have notarized. Once this is complete please bring a copy in to our office so we can scan it into your chart.   Conditions/risks identified: Aim for 30 minutes of exercise or brisk walking, 6-8 glasses of water, and 5 servings of fruits and vegetables each day.   Next appointment: Follow up in one year for your annual wellness visit    Preventive Care 65 Years and Older, Female Preventive care refers to lifestyle choices and visits with your health care provider that can promote health and wellness. What does preventive care include? A yearly physical exam. This is also called an annual well check. Dental exams once or twice a year. Routine eye exams. Ask your health care provider how often you should have your eyes checked. Personal lifestyle choices, including: Daily care of your teeth and gums. Regular physical activity. Eating a healthy diet. Avoiding tobacco and  drug use. Limiting alcohol use. Practicing safe sex. Taking low-dose aspirin every day. Taking vitamin and mineral supplements as recommended by your health care provider. What happens during an annual well check? The services and screenings done by your health care provider during your annual well check will depend on your age, overall health, lifestyle risk factors, and family history of disease. Counseling   Your health care provider may ask you questions about your: Alcohol use. Tobacco use. Drug use. Emotional well-being. Home and relationship well-being. Sexual activity. Eating habits. History of falls. Memory and ability to understand (cognition). Work and work Statistician. Reproductive health. Screening  You may have the following tests or measurements: Height, weight, and BMI. Blood pressure. Lipid and cholesterol levels. These may be checked every 5 years, or more frequently if you are over 44 years old. Skin check. Lung cancer screening. You may have this screening every year starting at age 20 if you have a 30-pack-year history of smoking and currently smoke or have quit within the past 15 years. Fecal occult blood test (FOBT) of the stool. You may have this test every year starting at age 32. Flexible sigmoidoscopy or colonoscopy. You may have a sigmoidoscopy every 5 years or a colonoscopy every 10 years starting at age 46. Hepatitis C blood test. Hepatitis B blood test. Sexually transmitted disease (STD) testing. Diabetes screening. This is done by checking your blood sugar (glucose) after you have not eaten for a while (fasting). You may have this done every 1-3 years. Bone density scan. This is done to screen for osteoporosis. You may have this done starting at age 54. Mammogram. This may be done every 1-2 years. Talk to your health care provider about how often you should have regular mammograms. Talk with your health care provider about your test results, treatment options, and if necessary, the need for more tests. Vaccines  Your health care provider may recommend certain vaccines, such as: Influenza vaccine. This is recommended every year. Tetanus, diphtheria, and acellular pertussis (Tdap, Td) vaccine. You may need a Td booster every 10 years. Zoster vaccine. You may need this after age 71. Pneumococcal 13-valent conjugate (PCV13) vaccine. One dose is recommended  after age 46. Pneumococcal polysaccharide (PPSV23) vaccine. One dose is recommended after age 41. Talk to your health care provider about which screenings and vaccines you need and how often you need them. This information is not intended to replace advice given to you by your health care provider. Make sure you discuss any questions you have with your health care provider. Document Released: 01/31/2015 Document Revised: 09/24/2015 Document Reviewed: 11/05/2014 Elsevier Interactive Patient Education  2017 [employer] Prevention in the Home Falls can cause injuries. They can happen to people of all ages. There are many things you can do to make your home safe and to help prevent falls. What can I do on the outside of my home? Regularly fix the edges of walkways and driveways and fix any cracks. Remove anything that might make you trip as you walk through a door, such as a raised step or threshold. Trim any bushes or trees on the path to your home. Use bright outdoor lighting. Clear any walking paths of anything that might make someone trip, such as rocks or tools. Regularly check to see if handrails are loose or broken. Make sure that both sides of any steps have handrails. Any raised decks and porches should have guardrails on the edges. Have any leaves, snow, or ice  cleared regularly. Use sand or salt on walking paths during winter. Clean up any spills in your garage right away. This includes oil or grease spills. What can I do in the bathroom? Use night lights. Install grab bars by the toilet and in the tub and shower. Do not use towel bars as grab bars. Use non-skid mats or decals in the tub or shower. If you need to sit down in the shower, use a plastic, non-slip stool. Keep the floor dry. Clean up any water that spills on the floor as soon as it happens. Remove soap buildup in the tub or shower regularly. Attach bath mats securely with double-sided non-slip rug tape. Do not  have throw rugs and other things on the floor that can make you trip. What can I do in the bedroom? Use night lights. Make sure that you have a light by your bed that is easy to reach. Do not use any sheets or blankets that are too big for your bed. They should not hang down onto the floor. Have a firm chair that has side arms. You can use this for support while you get dressed. Do not have throw rugs and other things on the floor that can make you trip. What can I do in the kitchen? Clean up any spills right away. Avoid walking on wet floors. Keep items that you use a lot in easy-to-reach places. If you need to reach something above you, use a strong step stool that has a grab bar. Keep electrical cords out of the way. Do not use floor polish or wax that makes floors slippery. If you must use wax, use non-skid floor wax. Do not have throw rugs and other things on the floor that can make you trip. What can I do with my stairs? Do not leave any items on the stairs. Make sure that there are handrails on both sides of the stairs and use them. Fix handrails that are broken or loose. Make sure that handrails are as long as the stairways. Check any carpeting to make sure that it is firmly attached to the stairs. Fix any carpet that is loose or worn. Avoid having throw rugs at the top or bottom of the stairs. If you do have throw rugs, attach them to the floor with carpet tape. Make sure that you have a light switch at the top of the stairs and the bottom of the stairs. If you do not have them, ask someone to add them for you. What else can I do to help prevent falls? Wear shoes that: Do not have high heels. Have rubber bottoms. Are comfortable and fit you well. Are closed at the toe. Do not wear sandals. If you use a stepladder: Make sure that it is fully opened. Do not climb a closed stepladder. Make sure that both sides of the stepladder are locked into place. Ask someone to hold it for  you, if possible. Clearly mark and make sure that you can see: Any grab bars or handrails. First and last steps. Where the edge of each step is. Use tools that help you move around (mobility aids) if they are needed. These include: Canes. Walkers. Scooters. Crutches. Turn on the lights when you go into a dark area. Replace any light bulbs as soon as they burn out. Set up your furniture so you have a clear path. Avoid moving your furniture around. If any of your floors are uneven, fix them. If there are any pets  around you, be aware of where they are. Review your medicines with your doctor. Some medicines can make you feel dizzy. This can increase your chance of falling. Ask your doctor what other things that you can do to help prevent falls. This information is not intended to replace advice given to you by your health care provider. Make sure you discuss any questions you have with your health care provider. Document Released: 10/31/2008 Document Revised: 06/12/2015 Document Reviewed: 02/08/2014 Elsevier Interactive Patient Education  2017 [employer].

## 2021-12-25 ENCOUNTER — Encounter: Payer: Self-pay | Admitting: Family Medicine

## 2021-12-25 ENCOUNTER — Telehealth: Payer: Self-pay | Admitting: Family Medicine

## 2021-12-25 DIAGNOSIS — I10 Essential (primary) hypertension: Secondary | ICD-10-CM

## 2021-12-25 MED ORDER — AMLODIPINE BESYLATE 10 MG PO TABS: 10.0000 mg | ORAL_TABLET | Freq: Every day | ORAL | 0 refills | Status: DC

## 2021-12-25 MED ORDER — LOSARTAN POTASSIUM-HCTZ 100-25 MG PO TABS: 1.0000 | ORAL_TABLET | Freq: Every day | ORAL | 0 refills | Status: DC

## 2021-12-25 NOTE — Telephone Encounter (Signed)
Pt rc. Apt scheduled for 12/31/2021

## 2021-12-25 NOTE — Addendum Note (Signed)
Addended by: Antonietta Barcelona D on: 12/25/2021 03:07 PM   Modules accepted: Orders

## 2021-12-25 NOTE — Telephone Encounter (Signed)
I called pt to schedule an appt w/[facility] for Med refill for x2 RX's. And mail this to pt also.

## 2021-12-25 NOTE — Telephone Encounter (Signed)
Pt aware refills sent to pharmacy 

## 2021-12-25 NOTE — Telephone Encounter (Signed)
[facility] NTBS for 6 mos FU last chronic ckup was in April RF NOT sent to mail order

## 2021-12-31 ENCOUNTER — Encounter: Payer: Self-pay | Admitting: Family Medicine

## 2021-12-31 ENCOUNTER — Ambulatory Visit (INDEPENDENT_AMBULATORY_CARE_PROVIDER_SITE_OTHER): Payer: Medicare Other | Admitting: Family Medicine

## 2021-12-31 ENCOUNTER — Telehealth: Payer: Self-pay | Admitting: Family Medicine

## 2021-12-31 VITALS — BP 147/81 | HR 74 | Temp 98.2°F | Ht 65.0 in | Wt 220.0 lb

## 2021-12-31 DIAGNOSIS — I10 Essential (primary) hypertension: Secondary | ICD-10-CM | POA: Diagnosis not present

## 2021-12-31 DIAGNOSIS — S3992XA Unspecified injury of lower back, initial encounter: Secondary | ICD-10-CM

## 2021-12-31 DIAGNOSIS — R110 Nausea: Secondary | ICD-10-CM

## 2021-12-31 DIAGNOSIS — W19XXXA Unspecified fall, initial encounter: Secondary | ICD-10-CM | POA: Diagnosis not present

## 2021-12-31 DIAGNOSIS — K219 Gastro-esophageal reflux disease without esophagitis: Secondary | ICD-10-CM

## 2021-12-31 MED ORDER — LOSARTAN POTASSIUM-HCTZ 100-25 MG PO TABS: 1.0000 | ORAL_TABLET | Freq: Every day | ORAL | 3 refills | Status: DC

## 2021-12-31 MED ORDER — AMLODIPINE BESYLATE 10 MG PO TABS: 10.0000 mg | ORAL_TABLET | Freq: Every day | ORAL | 3 refills | Status: DC

## 2021-12-31 MED ORDER — FAMOTIDINE 20 MG PO TABS: 20.0000 mg | ORAL_TABLET | Freq: Two times a day (BID) | ORAL | 3 refills | Status: DC

## 2021-12-31 NOTE — Progress Notes (Signed)
BP (!) 147/81   Pulse 74   Temp 98.2 F (36.8 C)   Ht _0  (1.651 m)   Wt 220 lb (99.8 kg)   SpO2 94%   BMI 36.61 kg/m    Subjective:   Patient ID: Anna Bradford, female    DOB: April 08, 1933, 88 y.o.   MRN: [id number]  HPI: Anna Bradford is a 88 y.o. female presenting on 12/31/2021 for Medical Management of Chronic Issues, Hypertension, Tailbone Pain (Fall 12/3), and nodule (Right arm-new)   HPI Fall and right knee and tailbone pain Patient had a fall on 12/20/2021 and fell on her backside her right arm.  She has since also been having right knee pain on that side but the right arm pain has gotten better and the right knee pain is improving but she still having tailbone pain.  She says she fell because she was holding a sprinkler door and it let loose and she fell backwards.  She has been progressively getting weaker and her ability to get up and out of chairs but she has been working on some leg strengthening.  Hypertension Patient is currently on amlodipine and losartan hydrochlorothiazide, and their blood pressure today is 147/81. Patient denies any lightheadedness or dizziness. Patient denies headaches, blurred vision, chest pains, shortness of breath, or weakness. Denies any side effects from medication and is content with current medication.   GERD Patient is currently on famotidine.  She denies any major symptoms or abdominal pain or belching or burping. She denies any blood in her stool or lightheadedness or dizziness.   Relevant past medical, surgical, family and social history reviewed and updated as indicated. Interim medical history since our last visit reviewed. Allergies and medications reviewed and updated.  Review of Systems  Constitutional:  Negative for chills and fever.  HENT:  Negative for congestion, ear discharge and ear pain.   Eyes:  Negative for redness and visual disturbance.  Respiratory:  Negative for chest tightness and shortness of breath.    Cardiovascular:  Negative for chest pain and leg swelling.  Genitourinary:  Negative for difficulty urinating and dysuria.  Musculoskeletal:  Negative for back pain and gait problem.  Skin:  Negative for rash.  Neurological:  Negative for dizziness, light-headedness and headaches.  Psychiatric/Behavioral:  Negative for agitation and behavioral problems.   All other systems reviewed and are negative.   Per HPI unless specifically indicated above   Allergies as of 12/31/2021       Reactions   Levofloxacin    Other (See Comments) tendonitis   Nitrofurantoin    Other reaction(s): Other (See Comments) Bleeding thru bowels   Macrolides And Ketolides    Bleeding thru bowels   Codeine Nausea And Vomiting   Sulfa Antibiotics Nausea And Vomiting        Medication List        Accurate as of December 31, 2021  2:26 PM. If you have any questions, ask your nurse or doctor.          acetaminophen 325 MG tablet Commonly known as: TYLENOL Take 650 mg by mouth daily with lunch.   acetaminophen 500 MG tablet Commonly known as: TYLENOL Take 2 tablets (1,000 mg total) by mouth every 6 (six) hours.   albuterol 108 (90 Base) MCG/ACT inhaler Commonly known as: VENTOLIN HFA Inhale 2 puffs into the lungs every 6 (six) hours as needed for wheezing or shortness of breath.   AMBULATORY NON FORMULARY MEDICATION Take 1 capsule  by mouth daily. Medication Name: Glolo- OTC weight loss   amitriptyline 50 MG tablet Commonly known as: ELAVIL Take 1 tablet (50 mg total) by mouth at bedtime.   amLODipine 10 MG tablet Commonly known as: NORVASC Take 1 tablet (10 mg total) by mouth daily.   baclofen 10 MG tablet Commonly known as: LIORESAL Take 1 tablet at bedtime as needed for muscle spasms.   CALCIUM-MAGNESIUM-ZINC-D3 PO Take 1 tablet by mouth daily.   cephALEXin 500 MG capsule Commonly known as: KEFLEX Take 1 capsule (500 mg total) by mouth 4 (four) times daily.   cephALEXin 500  MG capsule Commonly known as: KEFLEX Take 500 mg by mouth 3 (three) times daily.   clobetasol cream 0.05 % Commonly known as: TEMOVATE Apply topically 2 (two) times daily.   diphenhydrAMINE 25 MG tablet Commonly known as: BENADRYL Take 25 mg by mouth daily as needed for allergies.   famotidine 20 MG tablet Commonly known as: Pepcid Take 1 tablet (20 mg total) by mouth 2 (two) times daily.   losartan-hydrochlorothiazide 100-25 MG tablet Commonly known as: HYZAAR Take 1 tablet by mouth daily.   magnesium hydroxide 400 MG/5ML suspension Commonly known as: MILK OF MAGNESIA Take 15 mLs by mouth every other day.   methocarbamol 500 MG tablet Commonly known as: ROBAXIN Take 500 mg by mouth 4 (four) times daily.   multivitamin tablet Take 1 tablet by mouth daily.   nystatin cream Commonly known as: MYCOSTATIN Apply 1 application  topically daily as needed for dry skin.   Potassium 99 MG Tabs Take 99 mg by mouth daily.   rOPINIRole 2 MG tablet Commonly known as: REQUIP TAKE ONE TABLET AT BEDTIME FOR LEG CRAMPS   SYSTANE OP Place 1 drop into both eyes daily as needed (dry eyes).   triamcinolone cream 0.1 % Commonly known as: KENALOG Apply 1 application topically 2 (two) times daily. What changed:  when to take this reasons to take this   vitamin C 1000 MG tablet Take 1,000 mg by mouth daily.   Vitamin D-3 25 MCG (1000 UT) Caps Take 2,000 Units by mouth daily.   zinc gluconate 50 MG tablet Take 50 mg by mouth daily.         Objective:   BP (!) 147/81   Pulse 74   Temp 98.2 F (36.8 C)   Ht 5' 5" (1.651 m)   Wt 220 lb (99.8 kg)   SpO2 94%   BMI 36.61 kg/m   Wt Readings from Last 3 Encounters:  12/31/21 220 lb (99.8 kg)  12/14/21 220 lb (99.8 kg)  10/29/21 226 lb (102.5 kg)    Physical Exam Vitals and nursing note reviewed.  Constitutional:      General: She is not in acute distress.    Appearance: She is well-developed. She is not diaphoretic.   Eyes:     Conjunctiva/sclera: Conjunctivae normal.  Cardiovascular:     Rate and Rhythm: Normal rate and regular rhythm.     Heart sounds: Normal heart sounds. No murmur heard. Pulmonary:     Effort: Pulmonary effort is normal. No respiratory distress.     Breath sounds: Normal breath sounds. No wheezing.  Musculoskeletal:        General: No swelling or tenderness. Normal range of motion.  Skin:    General: Skin is warm and dry.     Findings: No rash.  Neurological:     Mental Status: She is alert and oriented to person, place, and  time.     Coordination: Coordination normal.  Psychiatric:        Behavior: Behavior normal.       Assessment & Plan:   Problem List Items Addressed This Visit       Cardiovascular and Mediastinum   Essential hypertension, benign   Relevant Medications   amLODipine (NORVASC) 10 MG tablet   losartan-hydrochlorothiazide (HYZAAR) 100-25 MG tablet   Other Relevant Orders   CMP14+EGFR   CBC with Differential/Platelet   Lipid panel     Digestive   GERD (gastroesophageal reflux disease) - Primary   Relevant Medications   famotidine (PEPCID) 20 MG tablet   Other Relevant Orders   CMP14+EGFR   CBC with Differential/Platelet   Lipid panel   Other Visit Diagnoses     Nausea       Relevant Medications   famotidine (PEPCID) 20 MG tablet   Injury of coccyx, initial encounter       Fall, initial encounter           We have no x-ray today but it does seem like she possibly fractured her coccyx, recommended that she use a doughnut. Follow up plan: Return in about 6 months (around 07/02/2022), or if symptoms worsen or fail to improve, for Hypertension recheck.  Counseling provided for all of the vaccine components Orders Placed This Encounter  Procedures   CMP14+EGFR   CBC with Differential/Platelet   Lipid panel    Caryl Pina, MD [facility] 12/31/2021, 2:26 PM

## 2021-12-31 NOTE — Telephone Encounter (Signed)
Meds sent

## 2022-01-01 LAB — CMP14+EGFR
ALT: 14 IU/L (ref 0–32)
AST: 14 IU/L (ref 0–40)
Albumin/Globulin Ratio: 1.4 (ref 1.2–2.2)
Albumin: 4.1 g/dL (ref 3.7–4.7)
Alkaline Phosphatase: 88 IU/L (ref 44–121)
BUN/Creatinine Ratio: 22 (ref 12–28)
BUN: 19 mg/dL (ref 8–27)
Bilirubin Total: 0.4 mg/dL (ref 0.0–1.2)
CO2: 24 mmol/L (ref 20–29)
Calcium: 9.8 mg/dL (ref 8.7–10.3)
Chloride: 103 mmol/L (ref 96–106)
Creatinine, Ser: 0.85 mg/dL (ref 0.57–1.00)
Globulin, Total: 2.9 g/dL (ref 1.5–4.5)
Glucose: 106 mg/dL — ABNORMAL HIGH (ref 70–99)
Potassium: 4.2 mmol/L (ref 3.5–5.2)
Sodium: 144 mmol/L (ref 134–144)
Total Protein: 7 g/dL (ref 6.0–8.5)
eGFR: 67 mL/min/{1.73_m2} (ref >59)

## 2022-01-01 LAB — CBC WITH DIFFERENTIAL/PLATELET
Basophils Absolute: 0 10*3/uL (ref 0.0–0.2)
Basos: 1 %
EOS (ABSOLUTE): 0.2 10*3/uL (ref 0.0–0.4)
Eos: 2 %
Hematocrit: 43.9 % (ref 34.0–46.6)
Hemoglobin: 14.7 g/dL (ref 11.1–15.9)
Immature Grans (Abs): 0 10*3/uL (ref 0.0–0.1)
Immature Granulocytes: 0 %
Lymphocytes Absolute: 2.2 10*3/uL (ref 0.7–3.1)
Lymphs: 25 %
MCH: 29.5 pg (ref 26.6–33.0)
MCHC: 33.5 g/dL (ref 31.5–35.7)
MCV: 88 fL (ref 79–97)
Monocytes Absolute: 0.7 10*3/uL (ref 0.1–0.9)
Monocytes: 8 %
Neutrophils Absolute: 5.6 10*3/uL (ref 1.4–7.0)
Neutrophils: 64 %
Platelets: 314 10*3/uL (ref 150–450)
RBC: 4.98 x10E6/uL (ref 3.77–5.28)
RDW: 13.1 % (ref 11.7–15.4)
WBC: 8.6 10*3/uL (ref 3.4–10.8)

## 2022-01-01 LAB — LIPID PANEL
Chol/HDL Ratio: 4.6 ratio — ABNORMAL HIGH (ref 0.0–4.4)
Cholesterol, Total: 161 mg/dL (ref 100–199)
HDL: 35 mg/dL — ABNORMAL LOW (ref >39)
LDL Chol Calc (NIH): 94 mg/dL (ref 0–99)
Triglycerides: 186 mg/dL — ABNORMAL HIGH (ref 0–149)
VLDL Cholesterol Cal: 32 mg/dL (ref 5–40)

## 2022-01-26 ENCOUNTER — Ambulatory Visit: Payer: Medicare Other | Admitting: General Surgery

## 2022-02-22 ENCOUNTER — Telehealth: Payer: Self-pay | Admitting: Orthopaedic Surgery

## 2022-02-22 NOTE — Telephone Encounter (Signed)
Returned the patient's call, lvm for her to call us back.  Pt's # [phone]

## 2022-04-06 IMAGING — MR MR LUMBAR SPINE W/O CM
4 of 5 series · 25 of 48 positions shown · non-contrast
Comparison: MRI lumbar spine dated June 17, 2020.

CLINICAL DATA: Chronic low back and right hip pain. No prior
surgery.

EXAM:
MRI LUMBAR SPINE WITHOUT CONTRAST
TECHNIQUE: Multiplanar, multisequence MR imaging of the lumbar spine was
performed. No intravenous contrast was administered.

[Series 2: T2 · sagittal · 4.0mm · 0.57mm/px · 6 of 15 slices shown (1 of 2)]
[im 1/15]
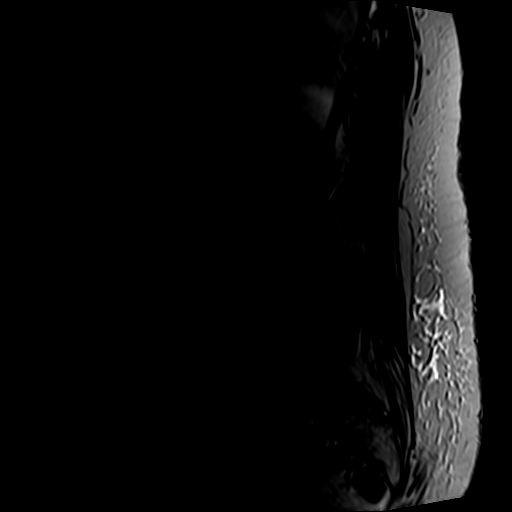
[im 3/15]
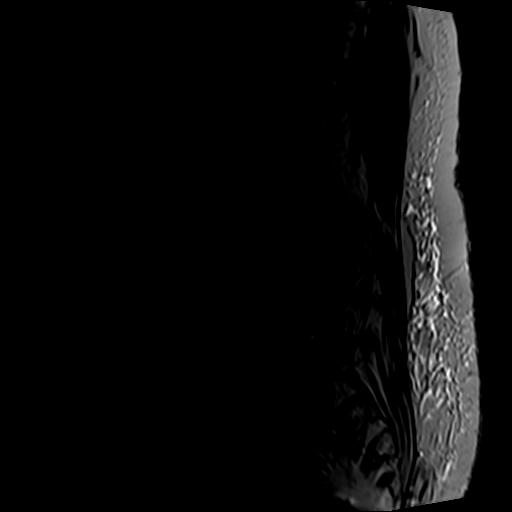
[im 6/15]
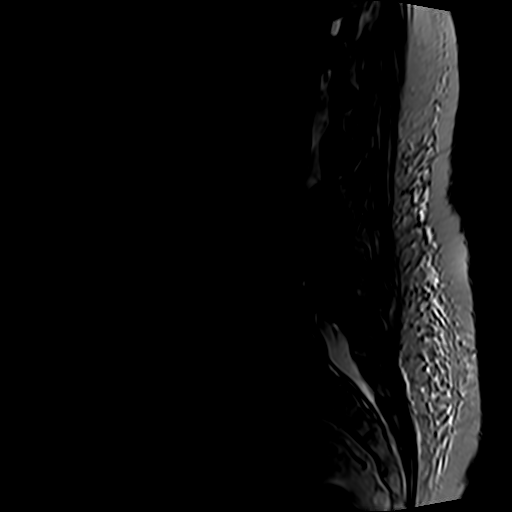
[im 9/15]
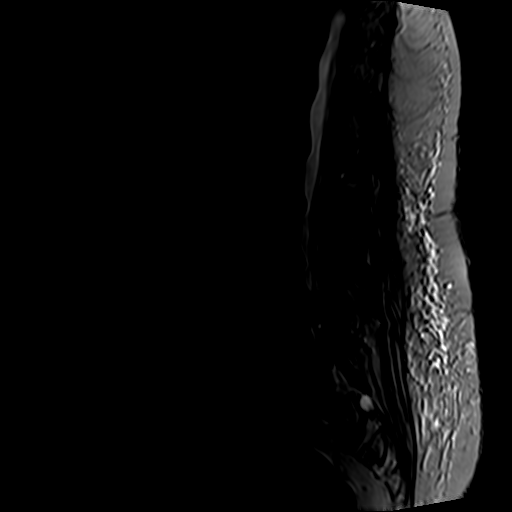
[im 12/15]
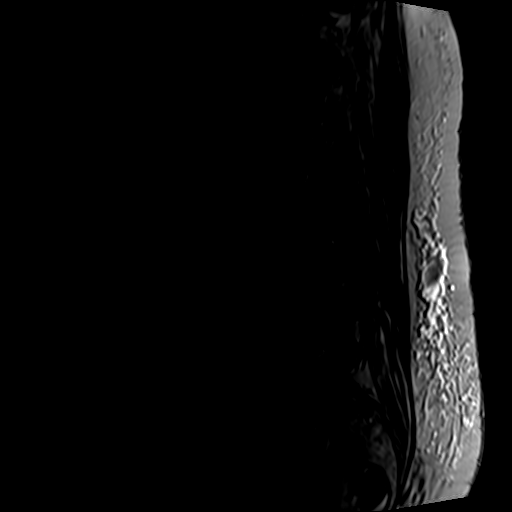
[im 15/15]
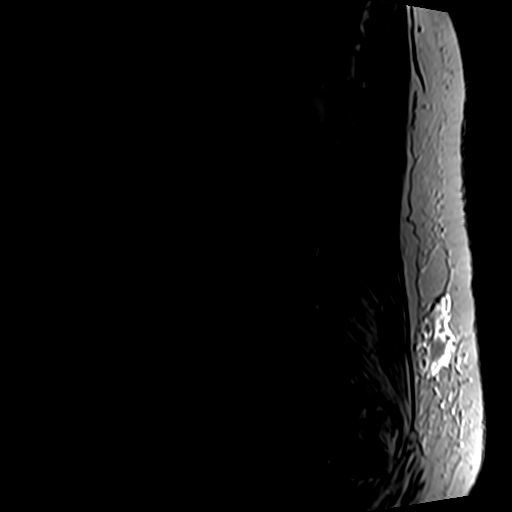

[Series 4: T1 · sagittal · 4.0mm · 0.57mm/px · 6 of 15 slices shown (1 of 2)]
[im 1/15]
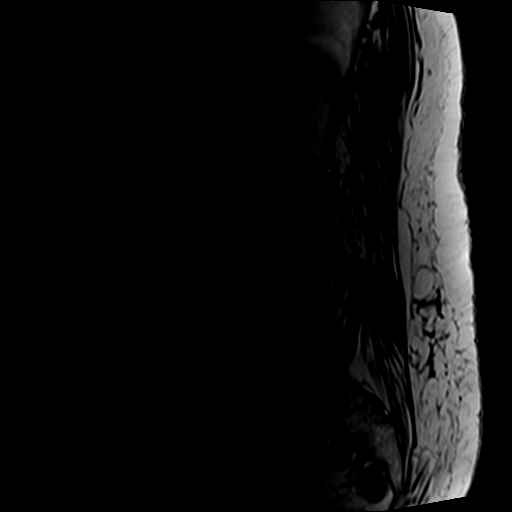
[im 3/15]
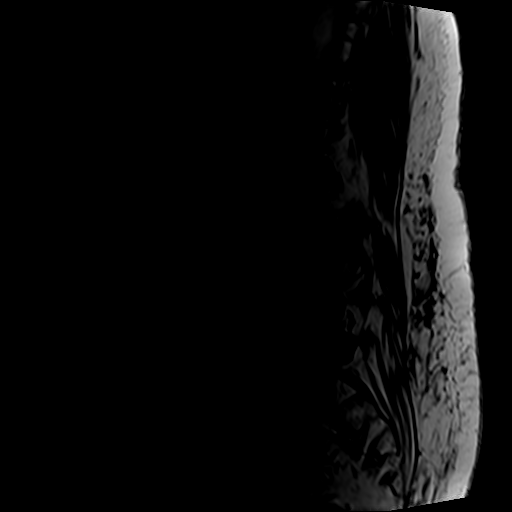
[im 6/15]
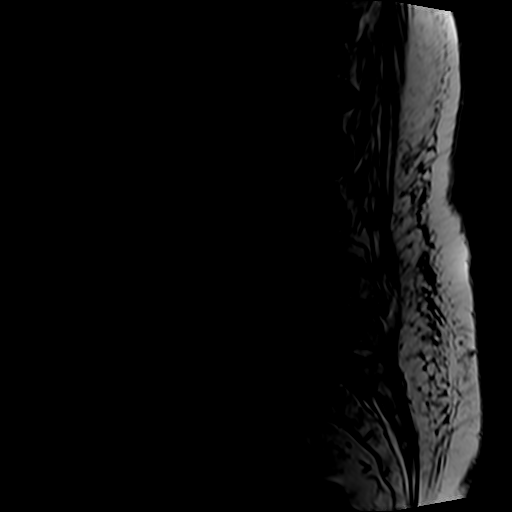
[im 9/15]
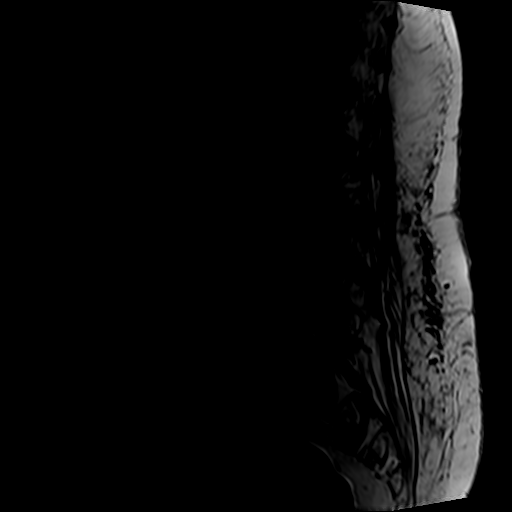
[im 12/15]
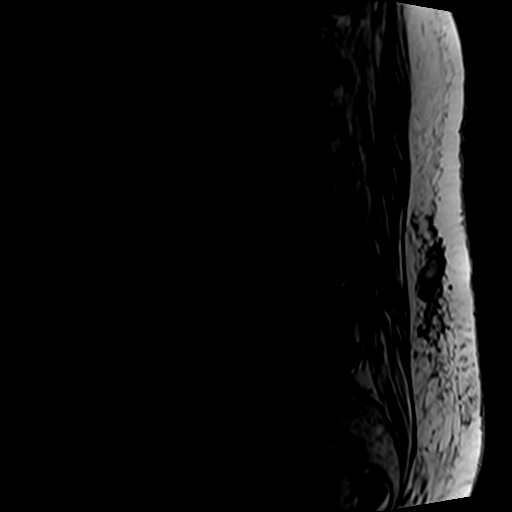
[im 15/15]
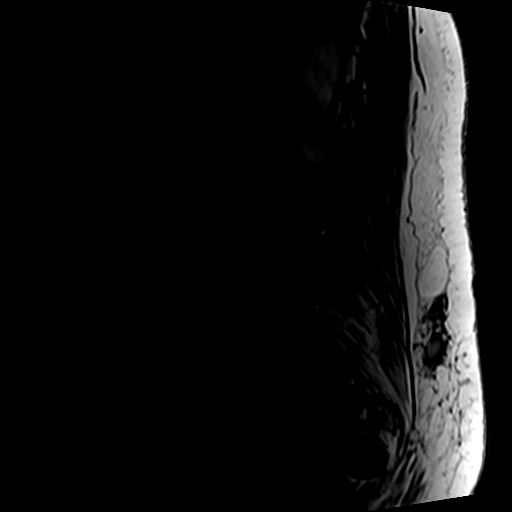

[Series 5: T2 · axial · 4.0mm · 0.70mm/px · z∈[-30,+179]mm · 9 of 37 slices shown (2 of 2)]
[im 1/37]
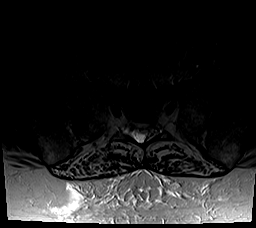
[im 6/37]
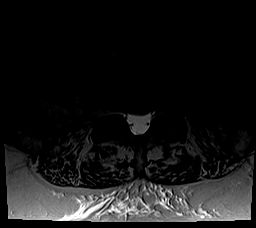
[im 11/37]
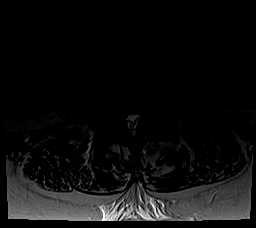
[im 16/37]
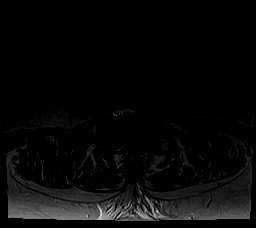
[im 19/37]
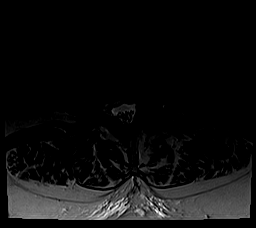
[im 21/37]
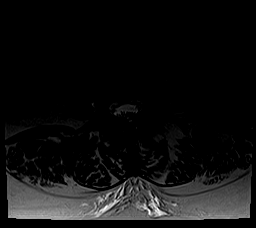
[im 26/37]
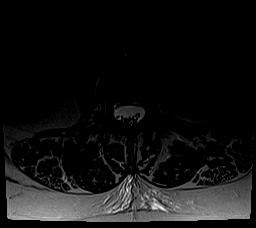
[im 31/37]
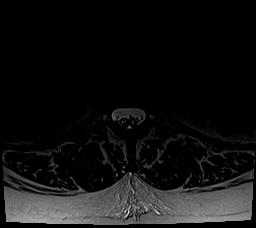
[im 37/37]
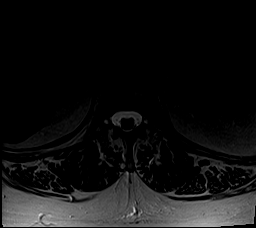

[Series 6: T1 · axial · 4.0mm · 0.35mm/px · z∈[-30,+148]mm · 4 of 37 slices shown (2 of 2)]
[im 1/37]
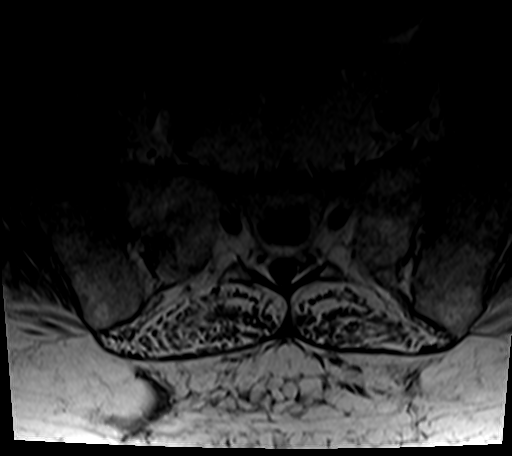
[im 6/37]
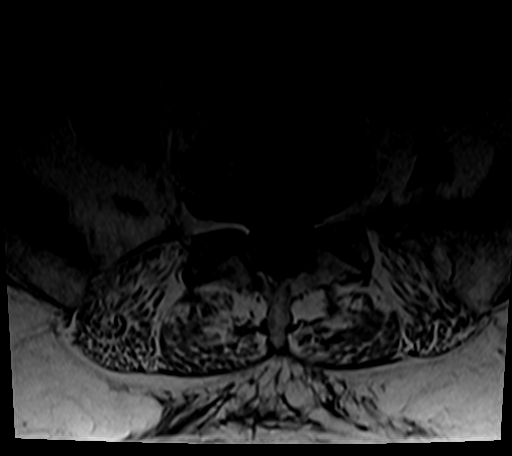
[im 19/37]
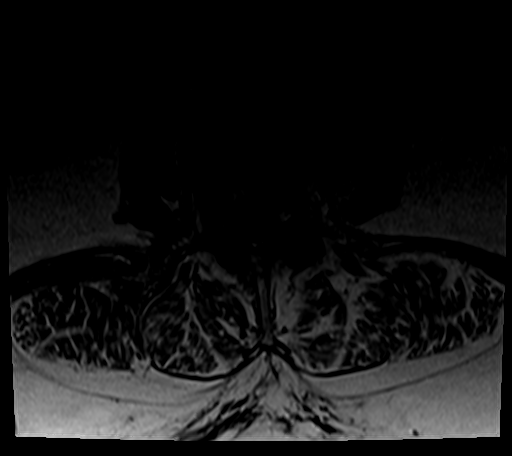
[im 31/37]
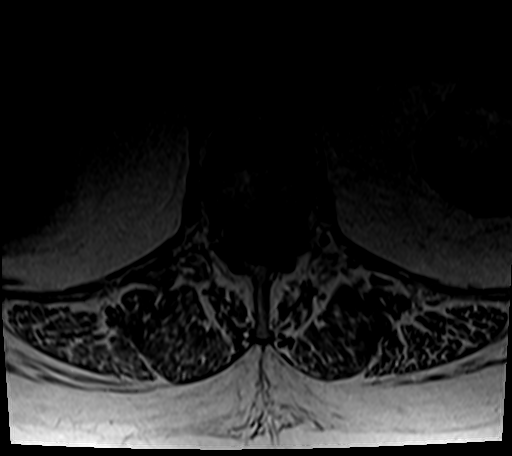

[25 of 48 positions shown; findings below may reference images not displayed]

FINDINGS: Segmentation:  Standard.

Alignment: Unchanged mild dextroscoliosis. Unchanged trace
retrolisthesis at L2-L3.

Vertebrae: No fracture, evidence of discitis, or bone lesion.
Progressive asymmetric degenerative endplate marrow edema at L3-L4
and L4-L5.

Conus medullaris and cauda equina: Conus extends to the L1-L2 level.
Conus and cauda equina appear normal.

Paraspinal and other soft tissues: Negative.

Disc levels:

T12-L1:  Negative.

L1-L2: Unchanged tiny shallow central disc protrusion and mild
bilateral facet arthropathy. No stenosis.

L2-L3: Unchanged mild disc bulging and mild-to-moderate bilateral
facet arthropathy. Unchanged mild spinal canal stenosis. No
neuroforaminal stenosis.

L3-L4: Left eccentric disc bulging with new superimposed left
subarticular disc extrusion migrating superiorly. Unchanged moderate
to severe left and mild-to-moderate right facet arthropathy.
Progressive now severe left lateral recess stenosis. Unchanged mild
spinal canal and left neuroforaminal stenosis. No right
neuroforaminal stenosis.

L4-L5: Unchanged moderate disc bulging and bilateral facet
arthropathy. Unchanged mild spinal canal and moderate bilateral
lateral recess stenosis. Unchanged mild bilateral neuroforaminal
stenosis.

L5-S1: Unchanged mild disc bulging with superimposed small shallow
central disc protrusion. Unchanged mild bilateral facet arthropathy.
Unchanged moderate right neuroforaminal stenosis. No spinal canal or
left neuroforaminal stenosis.
IMPRESSION: 1. Multilevel lumbar spondylosis as described above. New left
subarticular disc extrusion and progressive now severe left lateral
recess stenosis at L3-L4 which could affect the descending left L4
nerve root.
2. Unchanged moderate bilateral lateral recess stenosis at L4-L5 and
moderate right neuroforaminal stenosis at L5-S1.

## 2022-07-09 DIAGNOSIS — M25561 Pain in right knee: Secondary | ICD-10-CM | POA: Diagnosis not present

## 2022-07-09 DIAGNOSIS — G8929 Other chronic pain: Secondary | ICD-10-CM | POA: Diagnosis not present

## 2022-07-23 ENCOUNTER — Ambulatory Visit (INDEPENDENT_AMBULATORY_CARE_PROVIDER_SITE_OTHER): Payer: Medicare Other | Admitting: Family Medicine

## 2022-07-23 ENCOUNTER — Encounter: Payer: Self-pay | Admitting: Family Medicine

## 2022-07-23 VITALS — BP 193/77 | HR 76 | Ht 65.0 in | Wt 210.0 lb

## 2022-07-23 DIAGNOSIS — R399 Unspecified symptoms and signs involving the genitourinary system: Secondary | ICD-10-CM

## 2022-07-23 DIAGNOSIS — S161XXD Strain of muscle, fascia and tendon at neck level, subsequent encounter: Secondary | ICD-10-CM

## 2022-07-23 DIAGNOSIS — S90862A Insect bite (nonvenomous), left foot, initial encounter: Secondary | ICD-10-CM

## 2022-07-23 DIAGNOSIS — W57XXXA Bitten or stung by nonvenomous insect and other nonvenomous arthropods, initial encounter: Secondary | ICD-10-CM | POA: Diagnosis not present

## 2022-07-23 LAB — URINALYSIS, COMPLETE
Bilirubin, UA: NEGATIVE
Glucose, UA: NEGATIVE
Nitrite, UA: NEGATIVE
Protein,UA: NEGATIVE
RBC, UA: NEGATIVE
Specific Gravity, UA: 1.02 (ref 1.005–1.030)
Urobilinogen, Ur: 0.2 mg/dL (ref 0.2–1.0)
pH, UA: 6.5 (ref 5.0–7.5)

## 2022-07-23 LAB — MICROSCOPIC EXAMINATION
Epithelial Cells (non renal): NONE SEEN /hpf (ref 0–10)
RBC, Urine: NONE SEEN /hpf (ref 0–2)
Renal Epithel, UA: NONE SEEN /hpf

## 2022-07-23 MED ORDER — BACLOFEN 10 MG PO TABS: 10.0000 mg | ORAL_TABLET | Freq: Every evening | ORAL | 1 refills | Status: DC | PRN

## 2022-07-23 MED ORDER — DOXYCYCLINE HYCLATE 100 MG PO TABS: 100.0000 mg | ORAL_TABLET | Freq: Two times a day (BID) | ORAL | 0 refills | Status: DC

## 2022-07-23 NOTE — Progress Notes (Signed)
BP (!) 193/77   Pulse 76   Ht 5\' 5"  (1.651 m)   Wt 210 lb (95.3 kg)   SpO2 97%   BMI 34.95 kg/m    Subjective:   Patient ID: Anna Bradford, female    DOB: 09/16/1933, 89 y.o.   MRN: [id number]  HPI: Anna Bradford is a 89 y.o. female presenting on 07/23/2022 for Insect Bite (Left dorsal foot) and urine odor   HPI Patient is coming in with complaints of tick bite on her left foot just proximal to her fifth digit on the top of the foot.  She says she noticed about a month ago and it has been coming and going and just not healing completely.  She has been doing some Epsom salt soaks and putting Neosporin on it and it seems to heal and get worse and seems to heal and then will get worse.  She denies any fevers or chills or redness or warmth or surrounding rashes.  She says she will just pick the scab off and it will heal again.  Dysuria and odor Patient is coming in complaining of dysuria and urinary odor over the past few days.  She says she does not have much burning or pain but more the odor.  She states it is darker and larger in color.  There is any blood that she is noticed.  She denies any flank pain or fevers or chills.  She uses baclofen for back and neck muscular issues and it helps then she would like some more to help.  She can use that at night to help her sleep.  Relevant past medical, surgical, family and social history reviewed and updated as indicated. Interim medical history since our last visit reviewed. Allergies and medications reviewed and updated.  Review of Systems  Constitutional:  Negative for chills and fever.  Eyes:  Negative for visual disturbance.  Respiratory:  Negative for chest tightness and shortness of breath.   Cardiovascular:  Negative for chest pain and leg swelling.  Genitourinary:  Positive for dysuria, frequency and urgency. Negative for decreased urine volume, difficulty urinating and hematuria.  Musculoskeletal:  Negative for back pain and gait  problem.  Skin:  Positive for rash. Negative for color change.  Neurological:  Negative for dizziness, light-headedness and headaches.  Psychiatric/Behavioral:  Negative for agitation and behavioral problems.   All other systems reviewed and are negative.   Per HPI unless specifically indicated above   Allergies as of 07/23/2022       Reactions   Levofloxacin    Other (See Comments) tendonitis   Nitrofurantoin    Other reaction(s): Other (See Comments) Bleeding thru bowels   Macrolides And Ketolides    Bleeding thru bowels   Codeine Nausea And Vomiting   Sulfa Antibiotics Nausea And Vomiting        Medication List        Accurate as of July 23, 2022  3:11 PM. If you have any questions, ask your nurse or doctor.          STOP taking these medications    cephALEXin 500 MG capsule Commonly known as: KEFLEX Stopped by: Elige Radon Cindel Daugherty, MD       TAKE these medications    acetaminophen 325 MG tablet Commonly known as: TYLENOL Take 650 mg by mouth daily with lunch.   acetaminophen 500 MG tablet Commonly known as: TYLENOL Take 2 tablets (1,000 mg total) by mouth every 6 (six) hours.  albuterol 108 (90 Base) MCG/ACT inhaler Commonly known as: VENTOLIN HFA Inhale 2 puffs into the lungs every 6 (six) hours as needed for wheezing or shortness of breath.   AMBULATORY NON FORMULARY MEDICATION Take 1 capsule by mouth daily. Medication Name: Glolo- OTC weight loss   amitriptyline 50 MG tablet Commonly known as: ELAVIL Take 1 tablet (50 mg total) by mouth at bedtime.   amLODipine 10 MG tablet Commonly known as: NORVASC Take 1 tablet (10 mg total) by mouth daily.   baclofen 10 MG tablet Commonly known as: LIORESAL Take 1 tablet (10 mg total) by mouth at bedtime as needed for muscle spasms. What changed: See the new instructions. Changed by: Elige Radon Demauri Advincula, MD   CALCIUM-MAGNESIUM-ZINC-D3 PO Take 1 tablet by mouth daily.   clobetasol cream 0.05  % Commonly known as: TEMOVATE Apply topically 2 (two) times daily.   diphenhydrAMINE 25 MG tablet Commonly known as: BENADRYL Take 25 mg by mouth daily as needed for allergies.   doxycycline 100 MG tablet Commonly known as: VIBRA-TABS Take 1 tablet (100 mg total) by mouth 2 (two) times daily. 1 po bid Started by: Elige Radon Joseth Weigel, MD   famotidine 20 MG tablet Commonly known as: Pepcid Take 1 tablet (20 mg total) by mouth 2 (two) times daily.   losartan-hydrochlorothiazide 100-25 MG tablet Commonly known as: HYZAAR Take 1 tablet by mouth daily.   magnesium hydroxide 400 MG/5ML suspension Commonly known as: MILK OF MAGNESIA Take 15 mLs by mouth every other day.   methocarbamol 500 MG tablet Commonly known as: ROBAXIN Take 500 mg by mouth 4 (four) times daily.   multivitamin tablet Take 1 tablet by mouth daily.   nystatin cream Commonly known as: MYCOSTATIN Apply 1 application  topically daily as needed for dry skin.   Potassium 99 MG Tabs Take 99 mg by mouth daily.   rOPINIRole 2 MG tablet Commonly known as: REQUIP TAKE ONE TABLET AT BEDTIME FOR LEG CRAMPS   SYSTANE OP Place 1 drop into both eyes daily as needed (dry eyes).   triamcinolone cream 0.1 % Commonly known as: KENALOG Apply 1 application topically 2 (two) times daily. What changed:  when to take this reasons to take this   vitamin C 1000 MG tablet Take 1,000 mg by mouth daily.   Vitamin D-3 25 MCG (1000 UT) Caps Take 2,000 Units by mouth daily.   zinc gluconate 50 MG tablet Take 50 mg by mouth daily.         Objective:   BP (!) 193/77   Pulse 76   Ht 5\' 5"  (1.651 m)   Wt 210 lb (95.3 kg)   SpO2 97%   BMI 34.95 kg/m   Wt Readings from Last 3 Encounters:  07/23/22 210 lb (95.3 kg)  12/31/21 220 lb (99.8 kg)  12/14/21 220 lb (99.8 kg)    Physical Exam Vitals and nursing note reviewed.  Constitutional:      General: She is not in acute distress.    Appearance: She is  well-developed. She is not diaphoretic.  Eyes:     Conjunctiva/sclera: Conjunctivae normal.  Abdominal:     General: Abdomen is flat.     Palpations: Abdomen is soft. Abdomen is not rigid.     Tenderness: There is no abdominal tenderness. There is no right CVA tenderness, left CVA tenderness, guarding or rebound.  Skin:    General: Skin is warm and dry.     Findings: Lesion (0.1 cm area of  superficial ulceration on the dorsum of her left foot proximal to the left toe.  No erythema or drainage or purulence.  Nontender to palpation.  No warmth.) present. No rash.  Neurological:     Mental Status: She is alert and oriented to person, place, and time.     Coordination: Coordination normal.  Psychiatric:        Behavior: Behavior normal.     Results for orders placed or performed in visit on 12/31/21  CMP14+EGFR  Result Value Ref Range   Glucose 106 (H) 70 - 99 mg/dL   BUN 19 8 - 27 mg/dL   Creatinine, Ser 7.82 0.57 - 1.00 mg/dL   eGFR 67 >95 AO/ZHY/8.65   BUN/Creatinine Ratio 22 12 - 28   Sodium 144 134 - 144 mmol/L   Potassium 4.2 3.5 - 5.2 mmol/L   Chloride 103 96 - 106 mmol/L   CO2 24 20 - 29 mmol/L   Calcium 9.8 8.7 - 10.3 mg/dL   Total Protein 7.0 6.0 - 8.5 g/dL   Albumin 4.1 3.7 - 4.7 g/dL   Globulin, Total 2.9 1.5 - 4.5 g/dL   Albumin/Globulin Ratio 1.4 1.2 - 2.2   Bilirubin Total 0.4 0.0 - 1.2 mg/dL   Alkaline Phosphatase 88 44 - 121 IU/L   AST 14 0 - 40 IU/L   ALT 14 0 - 32 IU/L  CBC with Differential/Platelet  Result Value Ref Range   WBC 8.6 3.4 - 10.8 x10E3/uL   RBC 4.98 3.77 - 5.28 x10E6/uL   Hemoglobin 14.7 11.1 - 15.9 g/dL   Hematocrit 78.4 69.6 - 46.6 %   MCV 88 79 - 97 fL   MCH 29.5 26.6 - 33.0 pg   MCHC 33.5 31.5 - 35.7 g/dL   RDW 29.5 28.4 - 13.2 %   Platelets 314 150 - 450 x10E3/uL   Neutrophils 64 Not Estab. %   Lymphs 25 Not Estab. %   Monocytes 8 Not Estab. %   Eos 2 Not Estab. %   Basos 1 Not Estab. %   Neutrophils Absolute 5.6 1.4 - 7.0  x10E3/uL   Lymphocytes Absolute 2.2 0.7 - 3.1 x10E3/uL   Monocytes Absolute 0.7 0.1 - 0.9 x10E3/uL   EOS (ABSOLUTE) 0.2 0.0 - 0.4 x10E3/uL   Basophils Absolute 0.0 0.0 - 0.2 x10E3/uL   Immature Granulocytes 0 Not Estab. %   Immature Grans (Abs) 0.0 0.0 - 0.1 x10E3/uL  Lipid panel  Result Value Ref Range   Cholesterol, Total 161 100 - 199 mg/dL   Triglycerides 440 (H) 0 - 149 mg/dL   HDL 35 (L) >10 mg/dL   VLDL Cholesterol Cal 32 5 - 40 mg/dL   LDL Chol Calc (NIH) 94 0 - 99 mg/dL   Chol/HDL Ratio 4.6 (H) 0.0 - 4.4 ratio    Assessment & Plan:   Problem List Items Addressed This Visit   None Visit Diagnoses     UTI symptoms    -  Primary   Relevant Medications   doxycycline (VIBRA-TABS) 100 MG tablet   Other Relevant Orders   Urinalysis, Complete   Urine Culture   Tick bite of left foot, initial encounter       Relevant Medications   doxycycline (VIBRA-TABS) 100 MG tablet   Strain of neck muscle, subsequent encounter       Relevant Medications   baclofen (LIORESAL) 10 MG tablet       Recommended Vaseline on the tick bite area twice daily.  She  continue with Epsom soaks.    With tick bite rash having lasted so long, will go ahead and do doxycycline.  Urinalysis: 0-5 WBCs, moderate bacteria, trace ketones and trace leukocytes.  No major signs of infection but the doxycycline should cover as well  Blood pressure is elevated but patient does get worked up when she comes.  Says it runs pretty good at home.  Refill baclofen Follow up plan: Return if symptoms worsen or fail to improve.  Counseling provided for all of the vaccine components Orders Placed This Encounter  Procedures   Urine Culture   Urinalysis, Complete    Arville Care, MD [place] [facility] 07/23/2022, 3:11 PM

## 2022-07-25 LAB — URINE CULTURE

## 2022-07-27 NOTE — Progress Notes (Signed)
Patient r/ c °

## 2022-09-28 ENCOUNTER — Encounter: Payer: Self-pay | Admitting: Family

## 2022-09-28 ENCOUNTER — Ambulatory Visit (INDEPENDENT_AMBULATORY_CARE_PROVIDER_SITE_OTHER): Payer: Medicare Other

## 2022-09-28 ENCOUNTER — Ambulatory Visit (INDEPENDENT_AMBULATORY_CARE_PROVIDER_SITE_OTHER): Payer: Medicare Other | Admitting: Family

## 2022-09-28 VITALS — BP 186/77 | HR 98 | Temp 98.0°F | Ht 65.0 in | Wt 214.0 lb

## 2022-09-28 DIAGNOSIS — I700 Atherosclerosis of aorta: Secondary | ICD-10-CM | POA: Diagnosis not present

## 2022-09-28 DIAGNOSIS — R051 Acute cough: Secondary | ICD-10-CM | POA: Diagnosis not present

## 2022-09-28 DIAGNOSIS — B9689 Other specified bacterial agents as the cause of diseases classified elsewhere: Secondary | ICD-10-CM

## 2022-09-28 DIAGNOSIS — J208 Acute bronchitis due to other specified organisms: Secondary | ICD-10-CM | POA: Diagnosis not present

## 2022-09-28 DIAGNOSIS — R059 Cough, unspecified: Secondary | ICD-10-CM | POA: Diagnosis not present

## 2022-09-28 MED ORDER — BENZONATATE 200 MG PO CAPS
200.0000 mg | ORAL_CAPSULE | Freq: Three times a day (TID) | ORAL | 1 refills | Status: DC | PRN
Start: 2022-09-28 — End: 2022-11-18

## 2022-09-28 MED ORDER — PREDNISONE 20 MG PO TABS
40.0000 mg | ORAL_TABLET | Freq: Every day | ORAL | 0 refills | Status: AC
Start: 2022-09-28 — End: 2022-10-03

## 2022-09-28 MED ORDER — DOXYCYCLINE HYCLATE 100 MG PO TABS: 100.0000 mg | ORAL_TABLET | Freq: Two times a day (BID) | ORAL | 0 refills | Status: DC

## 2022-09-28 MED ORDER — ALBUTEROL SULFATE HFA 108 (90 BASE) MCG/ACT IN AERS
2.0000 | INHALATION_SPRAY | Freq: Four times a day (QID) | RESPIRATORY_TRACT | 0 refills | Status: DC | PRN
Start: 2022-09-28 — End: 2023-07-21

## 2022-09-28 MED ORDER — ALBUTEROL SULFATE HFA 108 (90 BASE) MCG/ACT IN AERS: 2.0000 | INHALATION_SPRAY | Freq: Four times a day (QID) | RESPIRATORY_TRACT | 0 refills | Status: DC | PRN

## 2022-09-28 MED ORDER — DOXYCYCLINE HYCLATE 100 MG PO TABS
100.0000 mg | ORAL_TABLET | Freq: Two times a day (BID) | ORAL | 0 refills | Status: DC
Start: 2022-09-28 — End: 2022-10-14

## 2022-09-28 MED ORDER — BENZONATATE 200 MG PO CAPS
200.0000 mg | ORAL_CAPSULE | Freq: Three times a day (TID) | ORAL | 1 refills | Status: DC | PRN
Start: 2022-09-28 — End: 2022-09-28

## 2022-09-28 MED ORDER — PREDNISONE 20 MG PO TABS
40.0000 mg | ORAL_TABLET | Freq: Every day | ORAL | 0 refills | Status: DC
Start: 2022-09-28 — End: 2022-09-28

## 2022-09-28 NOTE — Addendum Note (Signed)
Addended by: Ignacia Bayley on: 09/28/2022 08:46 AM   Modules accepted: Orders

## 2022-09-28 NOTE — Patient Instructions (Signed)

## 2022-09-28 NOTE — Progress Notes (Signed)
Subjective:    Patient ID: Anna Bradford, female    DOB: [birth year], 89 y.o.   MRN: [id number]  Chief Complaint  Patient presents with   Cough    Been going on since last Monday    Nasal Congestion    Cough This is a new problem. The current episode started 1 to 4 weeks ago. The problem has been gradually worsening. The problem occurs every few minutes. The cough is Productive of purulent sputum. Associated symptoms include chills, headaches, nasal congestion, postnasal drip and wheezing. Pertinent negatives include no ear congestion, ear pain, fever, myalgias or shortness of breath. She has tried rest and OTC cough suppressant for the symptoms. The treatment provided mild relief.      Review of Systems  Constitutional:  Positive for chills. Negative for fever.  HENT:  Positive for postnasal drip. Negative for ear pain.   Respiratory:  Positive for cough and wheezing. Negative for shortness of breath.   Musculoskeletal:  Negative for myalgias.  Neurological:  Positive for headaches.  All other systems reviewed and are negative.      Objective:   Physical Exam Vitals reviewed.  Constitutional:      General: She is not in acute distress.    Appearance: She is well-developed. She is obese.  HENT:     Head: Normocephalic and atraumatic.     Right Ear: External ear normal.  Eyes:     Pupils: Pupils are equal, round, and reactive to light.  Neck:     Thyroid: No thyromegaly.  Cardiovascular:     Rate and Rhythm: Normal rate and regular rhythm.     Heart sounds: Normal heart sounds. No murmur heard. Pulmonary:     Effort: Pulmonary effort is normal. No respiratory distress.     Breath sounds: Wheezing and rhonchi present.     Comments: Constant coarse cough, nonproductive Abdominal:     General: Bowel sounds are normal. There is no distension.     Palpations: Abdomen is soft.     Tenderness: There is no abdominal tenderness.  Musculoskeletal:        General: No  tenderness. Normal range of motion.     Cervical back: Normal range of motion and neck supple.  Skin:    General: Skin is warm and dry.  Neurological:     Mental Status: She is alert and oriented to person, place, and time.     Cranial Nerves: No cranial nerve deficit.     Deep Tendon Reflexes: Reflexes are normal and symmetric.  Psychiatric:        Behavior: Behavior normal.        Thought Content: Thought content normal.        Judgment: Judgment normal.       BP (!) 186/77   Pulse 98   Temp 98 F (36.7 C) (Temporal)   Ht 5\' 5"  (1.651 m)   Wt 214 lb (97.1 kg)   SpO2 96%   BMI 35.61 kg/m      Assessment & Plan:  Anna Bradford comes in today with chief complaint of Cough (Been going on since last Monday ) and Nasal Congestion   Diagnosis and orders addressed:  1. Acute bacterial bronchitis - Take meds as prescribed - Use a cool mist humidifier  -Use saline nose sprays frequently -Force fluids -For any cough or congestion  Use plain Mucinex- regular strength or max strength is fine -For fever or aces or pains- take tylenol or  ibuprofen. -Throat lozenges if help -Follow up if symptoms worsen or do not improve  - doxycycline (VIBRA-TABS) 100 MG tablet; Take 1 tablet (100 mg total) by mouth 2 (two) times daily.  Dispense: 20 tablet; Refill: 0 - predniSONE (DELTASONE) 20 MG tablet; Take 2 tablets (40 mg total) by mouth daily with breakfast for 5 days.  Dispense: 10 tablet; Refill: 0 - albuterol (VENTOLIN HFA) 108 (90 Base) MCG/ACT inhaler; Inhale 2 puffs into the lungs every 6 (six) hours as needed for wheezing or shortness of breath.  Dispense: 8 g; Refill: 0 - benzonatate (TESSALON) 200 MG capsule; Take 1 capsule (200 mg total) by mouth 3 (three) times daily as needed.  Dispense: 30 capsule; Refill: 1   2. Acute cough Start doxycycline and prednisone  - DG Chest 2 View   [place], [place]

## 2022-10-14 ENCOUNTER — Encounter: Payer: Self-pay | Admitting: Family Medicine

## 2022-10-14 ENCOUNTER — Ambulatory Visit (INDEPENDENT_AMBULATORY_CARE_PROVIDER_SITE_OTHER): Payer: Medicare Other | Admitting: Family Medicine

## 2022-10-14 VITALS — BP 201/100 | HR 87 | Ht 65.0 in | Wt 214.0 lb

## 2022-10-14 DIAGNOSIS — Z96652 Presence of left artificial knee joint: Secondary | ICD-10-CM | POA: Diagnosis not present

## 2022-10-14 DIAGNOSIS — J40 Bronchitis, not specified as acute or chronic: Secondary | ICD-10-CM

## 2022-10-14 MED ORDER — PREDNISONE 20 MG PO TABS
ORAL_TABLET | ORAL | 0 refills | Status: DC
Start: 2022-10-14 — End: 2023-01-21

## 2022-10-14 NOTE — Progress Notes (Signed)
BP (!) 201/100   Pulse 87   Ht 5\' 5"  (1.651 m)   Wt 214 lb (97.1 kg)   SpO2 96%   BMI 35.61 kg/m    Subjective:   Patient ID: Anna Bradford, female    DOB: 1933-03-27, 89 y.o.   MRN: [id number]  HPI: Anna Bradford is a 89 y.o. female presenting on 10/14/2022 for chest congestion and Cough   HPI Cough and congestion and chest congestion. Patient is cough congestion.  She was treated for bronchitis with doxycycline and prednisone and was getting better but yesterday was a little rougher and she was coughing a lot and then she took some DayQuil and she is feeling better today.  She denies any fevers or chills or shortness of breath or wheezing.  She does have the [employer] which helped and also her albuterol inhaler that she has not used much but does help as well.  Relevant past medical, surgical, family and social history reviewed and updated as indicated. Interim medical history since our last visit reviewed. Allergies and medications reviewed and updated.  Review of Systems  Constitutional:  Negative for chills and fever.  HENT:  Positive for congestion. Negative for ear discharge, ear pain, postnasal drip, rhinorrhea, sinus pressure, sneezing and sore throat.   Eyes:  Negative for pain, redness and visual disturbance.  Respiratory:  Positive for cough. Negative for chest tightness, shortness of breath and wheezing.   Cardiovascular:  Negative for chest pain and leg swelling.  Genitourinary:  Negative for difficulty urinating and dysuria.  Musculoskeletal:  Negative for myalgias.  Skin:  Negative for rash.  Neurological:  Negative for light-headedness and headaches.  Psychiatric/Behavioral:  Negative for agitation and behavioral problems.   All other systems reviewed and are negative.   Per HPI unless specifically indicated above   Allergies as of 10/14/2022       Reactions   Levofloxacin    Other (See Comments) tendonitis   Nitrofurantoin    Other reaction(s):  Other (See Comments) Bleeding thru bowels   Macrolides And Ketolides    Bleeding thru bowels   Codeine Nausea And Vomiting   Sulfa Antibiotics Nausea And Vomiting        Medication List        Accurate as of October 14, 2022 10:18 AM. If you have any questions, ask your nurse or doctor.          STOP taking these medications    doxycycline 100 MG tablet Commonly known as: VIBRA-TABS Stopped by: Elige Radon Jameer Storie       TAKE these medications    acetaminophen 325 MG tablet Commonly known as: TYLENOL Take 650 mg by mouth daily with lunch.   acetaminophen 500 MG tablet Commonly known as: TYLENOL Take 2 tablets (1,000 mg total) by mouth every 6 (six) hours.   albuterol 108 (90 Base) MCG/ACT inhaler Commonly known as: VENTOLIN HFA Inhale 2 puffs into the lungs every 6 (six) hours as needed for wheezing or shortness of breath.   AMBULATORY NON FORMULARY MEDICATION Take 1 capsule by mouth daily. Medication Name: Glolo- OTC weight loss   amitriptyline 50 MG tablet Commonly known as: ELAVIL Take 1 tablet (50 mg total) by mouth at bedtime.   amLODipine 10 MG tablet Commonly known as: NORVASC Take 1 tablet (10 mg total) by mouth daily.   baclofen 10 MG tablet Commonly known as: LIORESAL Take 1 tablet (10 mg total) by mouth at bedtime as needed for  muscle spasms.   benzonatate 200 MG capsule Commonly known as: TESSALON Take 1 capsule (200 mg total) by mouth 3 (three) times daily as needed.   CALCIUM-MAGNESIUM-ZINC-D3 PO Take 1 tablet by mouth daily.   clobetasol cream 0.05 % Commonly known as: TEMOVATE Apply topically 2 (two) times daily.   diphenhydrAMINE 25 MG tablet Commonly known as: BENADRYL Take 25 mg by mouth daily as needed for allergies.   famotidine 20 MG tablet Commonly known as: Pepcid Take 1 tablet (20 mg total) by mouth 2 (two) times daily.   losartan-hydrochlorothiazide 100-25 MG tablet Commonly known as: HYZAAR Take 1 tablet by  mouth daily.   magnesium hydroxide 400 MG/5ML suspension Commonly known as: MILK OF MAGNESIA Take 15 mLs by mouth every other day.   methocarbamol 500 MG tablet Commonly known as: ROBAXIN Take 500 mg by mouth 4 (four) times daily.   multivitamin tablet Take 1 tablet by mouth daily.   nystatin cream Commonly known as: MYCOSTATIN Apply 1 application  topically daily as needed for dry skin.   Potassium 99 MG Tabs Take 99 mg by mouth daily.   predniSONE 20 MG tablet Commonly known as: DELTASONE 2 po at same time daily for 5 days Started by: Elige Radon Murad Staples   rOPINIRole 2 MG tablet Commonly known as: REQUIP TAKE ONE TABLET AT BEDTIME FOR LEG CRAMPS   SYSTANE OP Place 1 drop into both eyes daily as needed (dry eyes).   triamcinolone cream 0.1 % Commonly known as: KENALOG Apply 1 application topically 2 (two) times daily. What changed:  when to take this reasons to take this   vitamin C 1000 MG tablet Take 1,000 mg by mouth daily.   Vitamin D-3 25 MCG (1000 UT) Caps Take 2,000 Units by mouth daily.   zinc gluconate 50 MG tablet Take 50 mg by mouth daily.         Objective:   BP (!) 201/100   Pulse 87   Ht 5\' 5"  (1.651 m)   Wt 214 lb (97.1 kg)   SpO2 96%   BMI 35.61 kg/m   Wt Readings from Last 3 Encounters:  10/14/22 214 lb (97.1 kg)  09/28/22 214 lb (97.1 kg)  07/23/22 210 lb (95.3 kg)    Physical Exam Vitals reviewed.  Constitutional:      General: She is not in acute distress.    Appearance: She is well-developed. She is not diaphoretic.  HENT:     Nose: Mucosal edema present. No rhinorrhea.     Right Sinus: No maxillary sinus tenderness or frontal sinus tenderness.     Left Sinus: No maxillary sinus tenderness or frontal sinus tenderness.     Mouth/Throat:     Pharynx: Uvula midline. No oropharyngeal exudate or posterior oropharyngeal erythema.     Tonsils: No tonsillar abscesses.  Eyes:     Conjunctiva/sclera: Conjunctivae normal.   Cardiovascular:     Rate and Rhythm: Normal rate and regular rhythm.     Heart sounds: Normal heart sounds. No murmur heard. Pulmonary:     Effort: Pulmonary effort is normal. No respiratory distress.     Breath sounds: Normal breath sounds. No wheezing, rhonchi or rales.  Musculoskeletal:        General: No tenderness. Normal range of motion.  Skin:    General: Skin is warm and dry.     Findings: No rash.  Neurological:     Mental Status: She is alert and oriented to person, place, and time.  Coordination: Coordination normal.  Psychiatric:        Behavior: Behavior normal.       Assessment & Plan:   Problem List Items Addressed This Visit   None Visit Diagnoses     Bronchitis    -  Primary   Relevant Medications   predniSONE (DELTASONE) 20 MG tablet       Will do another short course of prednisone to see if we can help clear and recommended that she continue with the DayQuil and Tessalon Perles and the albuterol inhaler. Follow up plan: Return if symptoms worsen or fail to improve.  Counseling provided for all of the vaccine components No orders of the defined types were placed in this encounter.   Arville Care, MD [facility] 10/14/2022, 10:18 AM

## 2022-11-17 ENCOUNTER — Encounter: Payer: Self-pay | Admitting: Orthopaedic Surgery

## 2022-11-17 ENCOUNTER — Ambulatory Visit: Payer: Medicare Other | Admitting: Orthopaedic Surgery

## 2022-11-17 VITALS — BP 142/82 | HR 81 | Ht 65.0 in | Wt 218.0 lb

## 2022-11-17 DIAGNOSIS — M7061 Trochanteric bursitis, right hip: Secondary | ICD-10-CM

## 2022-11-17 DIAGNOSIS — M7062 Trochanteric bursitis, left hip: Secondary | ICD-10-CM

## 2022-11-17 MED ORDER — METHYLPREDNISOLONE ACETATE 40 MG/ML IJ SUSP
40.0000 mg | Freq: Once | INTRAMUSCULAR | Status: AC
Start: 2022-11-17 — End: 2022-11-17
  Administered 2022-11-17: 40 mg via INTRA_ARTICULAR

## 2022-11-17 NOTE — Addendum Note (Signed)
Addended by: Michaele Offer on: 11/17/2022 09:31 AM   Modules accepted: Orders

## 2022-11-17 NOTE — Progress Notes (Signed)
PROCEDURE NOTE:  The patient request injection, verbal consent was obtained.  The right trochanteric area of the hip was prepped appropriately after time out was performed.   Sterile technique was observed and injection of 1 cc of DepoMedrol 40 mg with several cc's of plain xylocaine. Anesthesia was provided by ethyl chloride and a 20-gauge needle was used to inject the hip area. The injection was tolerated well.  A band aid dressing was applied.  The patient was advised to apply ice later today and tomorrow to the injection sight as needed.  Encounter Diagnosis  Name Primary?   Trochanteric bursitis, right hip Yes    Return in two weeks.  Continue her ibuprofen.  Call if any problem.  Precautions discussed.  Electronically Signed Darreld Mclean, MD 10/30/20248:29 AM

## 2022-11-18 ENCOUNTER — Encounter: Payer: Self-pay | Admitting: Family Medicine

## 2022-11-18 ENCOUNTER — Ambulatory Visit (INDEPENDENT_AMBULATORY_CARE_PROVIDER_SITE_OTHER): Payer: Medicare Other | Admitting: Family Medicine

## 2022-11-18 VITALS — BP 137/68 | HR 89 | Temp 98.1°F | Ht 65.0 in | Wt 216.4 lb

## 2022-11-18 DIAGNOSIS — R600 Localized edema: Secondary | ICD-10-CM

## 2022-11-18 DIAGNOSIS — Z23 Encounter for immunization: Secondary | ICD-10-CM | POA: Diagnosis not present

## 2022-11-18 NOTE — Progress Notes (Signed)
Subjective: CC: Bilateral lower extremity edema PCP: Dettinger, Elige Radon, MD  FAO:Anna Bradford is a 89 y.o. female presenting to clinic today for:  Bilateral lower extremity edema Patient presents today for a 5-day history of bilateral lower extremity edema. Patient reports she first started noticing the edema on Saturday, and it progressively worsened over the past few days. When she called to schedule an appointment yesterday, it was recommended she elevate her legs. She has a hospital bed at home, so she slept with her legs elevated overnight. She reports the edema is significantly improved today. Both legs remain tender to touch as it feels like the skin is stretching. She has permanent discoloration from venous stasis dermatitis, but she reports the skin appears redder than usual. She denies any chest pain, shortness of breath, orthopnea, paroxysmal nocturnal dyspnea, fevers, chills, hemoptysis, and palpitations. She has recently been eating watermelon with salt, potato chips, and hot dogs, though this is not too different from her regular diet. Her activity levels are unchanged, and she denies any recent travel.  Relevant past medical, surgical, family, and social history reviewed and updated as indicated.  Allergies and medications reviewed and updated.  Allergies  Allergen Reactions   Levofloxacin     Other (See Comments) tendonitis   Nitrofurantoin     Other reaction(s): Other (See Comments) Bleeding thru bowels   Macrolides And Ketolides     Bleeding thru bowels   Codeine Nausea And Vomiting   Sulfa Antibiotics Nausea And Vomiting   Past Medical History:  Diagnosis Date   Arthritis    Cancer (HCC) 1996   left breast   Essential hypertension, benign    GERD (gastroesophageal reflux disease)    History of kidney stones     Current Outpatient Medications:    acetaminophen (TYLENOL) 325 MG tablet, Take 650 mg by mouth daily with lunch., Disp: , Rfl:    acetaminophen  (TYLENOL) 500 MG tablet, Take 2 tablets (1,000 mg total) by mouth every 6 (six) hours., Disp: 30 tablet, Rfl: 0   albuterol (VENTOLIN HFA) 108 (90 Base) MCG/ACT inhaler, Inhale 2 puffs into the lungs every 6 (six) hours as needed for wheezing or shortness of breath., Disp: 8 g, Rfl: 0   AMBULATORY NON FORMULARY MEDICATION, Take 1 capsule by mouth daily. Medication Name: Glolo- OTC weight loss, Disp: , Rfl:    amitriptyline (ELAVIL) 50 MG tablet, Take 1 tablet (50 mg total) by mouth at bedtime., Disp: 90 tablet, Rfl: 3   amLODipine (NORVASC) 10 MG tablet, Take 1 tablet (10 mg total) by mouth daily., Disp: 100 tablet, Rfl: 3   Ascorbic Acid (VITAMIN C) 1000 MG tablet, Take 1,000 mg by mouth daily. , Disp: , Rfl:    baclofen (LIORESAL) 10 MG tablet, Take 1 tablet (10 mg total) by mouth at bedtime as needed for muscle spasms., Disp: 90 tablet, Rfl: 1   Cholecalciferol (VITAMIN D-3) 1000 UNITS CAPS, Take 2,000 Units by mouth daily., Disp: , Rfl:    clobetasol cream (TEMOVATE) 0.05 %, Apply topically 2 (two) times daily., Disp: 30 g, Rfl: 0   diphenhydrAMINE (BENADRYL) 25 MG tablet, Take 25 mg by mouth daily as needed for allergies., Disp: , Rfl:    famotidine (PEPCID) 20 MG tablet, Take 1 tablet (20 mg total) by mouth 2 (two) times daily., Disp: 200 tablet, Rfl: 3   losartan-hydrochlorothiazide (HYZAAR) 100-25 MG tablet, Take 1 tablet by mouth daily., Disp: 100 tablet, Rfl: 3   magnesium hydroxide (MILK  OF MAGNESIA) 400 MG/5ML suspension, Take 15 mLs by mouth every other day., Disp: , Rfl:    methocarbamol (ROBAXIN) 500 MG tablet, Take 500 mg by mouth 4 (four) times daily., Disp: , Rfl:    Multiple Minerals-Vitamins (CALCIUM-MAGNESIUM-ZINC-D3 PO), Take 1 tablet by mouth daily., Disp: , Rfl:    Multiple Vitamin (MULTIVITAMIN) tablet, Take 1 tablet by mouth daily., Disp: , Rfl:    nystatin cream (MYCOSTATIN), Apply 1 application  topically daily as needed for dry skin., Disp: , Rfl:    Polyethyl  Glycol-Propyl Glycol (SYSTANE OP), Place 1 drop into both eyes daily as needed (dry eyes)., Disp: , Rfl:    Potassium 99 MG TABS, Take 99 mg by mouth daily., Disp: , Rfl:    predniSONE (DELTASONE) 20 MG tablet, 2 po at same time daily for 5 days, Disp: 10 tablet, Rfl: 0   rOPINIRole (REQUIP) 2 MG tablet, TAKE ONE TABLET AT BEDTIME FOR LEG CRAMPS, Disp: 30 tablet, Rfl: 2   triamcinolone cream (KENALOG) 0.1 %, Apply 1 application topically 2 (two) times daily. (Patient taking differently: Apply 1 application  topically daily as needed (irritation/itching).), Disp: 80 g, Rfl: 0   zinc gluconate 50 MG tablet, Take 50 mg by mouth daily., Disp: , Rfl:  Social History   Socioeconomic History   Marital status: Widowed    Spouse name: Not on file   Number of children: 2   Years of education: 12-failed the 4th grade and had to repeat   Highest education level: 11th grade  Occupational History   Occupation: retired  Tobacco Use   Smoking status: Never   Smokeless tobacco: Never  Vaping Use   Vaping status: Never Used  Substance and Sexual Activity   Alcohol use: No   Drug use: No   Sexual activity: Not Currently    Birth control/protection: Surgical  Other Topics Concern   Not on file  Social History Narrative   Not on file   Social Determinants of Health   Financial Resource Strain: Low Risk  (12/14/2021)   Overall Financial Resource Strain (CARDIA)    Difficulty of Paying Living Expenses: Not hard at all  Food Insecurity: No Food Insecurity (12/14/2021)   Hunger Vital Sign    Worried About Running Out of Food in the Last Year: Never true    Ran Out of Food in the Last Year: Never true  Transportation Needs: No Transportation Needs (12/14/2021)   PRAPARE - Administrator, Civil Service (Medical): No    Lack of Transportation (Non-Medical): No  Physical Activity: Inactive (12/14/2021)   Exercise Vital Sign    Days of Exercise per Week: 0 days    Minutes of Exercise per  Session: 0 min  Stress: No Stress Concern Present (12/14/2021)   [employer] of Occupational Health - Occupational Stress Questionnaire    Feeling of Stress : Only a little  Social Connections: Moderately Isolated (12/14/2021)   Social Connection and Isolation Panel [NHANES]    Frequency of Communication with Friends and Family: More than three times a week    Frequency of Social Gatherings with Friends and Family: More than three times a week    Attends Religious Services: 1 to 4 times per year    Active Member of [employer] or Organizations: No    Attends Banker Meetings: Never    Marital Status: Widowed  Intimate Partner Violence: Not At Risk (12/14/2021)   Humiliation, Afraid, Rape, and Kick questionnaire  Fear of Current or Ex-Partner: No    Emotionally Abused: No    Physically Abused: No    Sexually Abused: No   Family History  Problem Relation Age of Onset   Cancer Mother    Coronary artery disease Mother    Heart disease Sister     Review of Systems  Constitutional:  Negative for activity change, chills and fever.  Respiratory:  Negative for cough, chest tightness and shortness of breath.   Cardiovascular:  Positive for leg swelling. Negative for chest pain and palpitations.  Gastrointestinal:  Negative for abdominal pain, nausea and vomiting.  Genitourinary:  Positive for frequency. Negative for difficulty urinating and dysuria.       Chronic urinary frequency and stress incontinence  Musculoskeletal:  Negative for myalgias.  Neurological:  Negative for dizziness, syncope, weakness, light-headedness and headaches.     Objective: Office vital signs reviewed. BP 137/68   Pulse 89   Temp 98.1 F (36.7 C) (Temporal)   Ht 5\' 5"  (1.651 m)   Wt 216 lb 6 oz (98.1 kg)   SpO2 95%   BMI 36.01 kg/m   Physical Examination:  Physical Exam Vitals and nursing note reviewed.  Constitutional:      General: She is not in acute distress.    Appearance:  Normal appearance. She is obese.  HENT:     Head: Normocephalic and atraumatic.     Right Ear: External ear normal.     Left Ear: External ear normal.  Eyes:     Conjunctiva/sclera: Conjunctivae normal.  Neck:     Comments: Right platysmal tightness Cardiovascular:     Rate and Rhythm: Normal rate and regular rhythm.     Heart sounds: Normal heart sounds.  Pulmonary:     Effort: Pulmonary effort is normal. No respiratory distress.     Breath sounds: Normal breath sounds. No rales.  Abdominal:     General: Abdomen is flat. There is no distension.     Palpations: Abdomen is soft.     Tenderness: There is no abdominal tenderness.  Musculoskeletal:     Cervical back: Normal range of motion and neck supple.     Right lower leg: Edema present.     Left lower leg: Edema present.     Comments: Trace edema to mid-calf  Skin:    General: Skin is warm and dry.     Comments: Bilateral discoloration from venous stasis dermatitis. Slight erythema on anterior left lower extremity, no warmth, purulence, or drainage.  Neurological:     Mental Status: She is alert and oriented to person, place, and time.  Psychiatric:        Mood and Affect: Mood normal.        Behavior: Behavior normal.        Thought Content: Thought content normal.        Judgment: Judgment normal.      Assessment/ Plan: Sheriece was seen today for edema.  Diagnoses and all orders for this visit:  Edema of both lower legs Suspect edema is multifactorial from venous stasis, recent increase in sodium intake, and medications (amlodipine). Recommend patient wear compression socks, elevate feet when sedentary, and decrease salt intake. She is already on hydrochlorothiazide, so will not add additional diuretic at this time. Will recheck labs below since they have not been checked since 12/2021. -     Brain natriuretic peptide -     BMP8+EGFR -     CBC with Differential/Platelet  Encounter  for immunization -     Flu Vaccine  Trivalent High Dose (Fluad)   Continue all other maintenance medications.  Follow up plan: Return if symptoms worsen or fail to improve.   The above assessment and management plan was discussed with the patient. The patient verbalized understanding of and has agreed to the management plan. Patient is aware to call the clinic if symptoms persist or worsen. Patient is aware when to return to the clinic for a follow-up visit. Patient educated on when it is appropriate to go to the emergency department.   Gillermina Phy, Medical Student [facility] [address] [place], [place] [address] [phone]

## 2022-11-19 LAB — BMP8+EGFR
BUN/Creatinine Ratio: 23 (ref 12–28)
BUN: 17 mg/dL (ref 8–27)
CO2: 29 mmol/L (ref 20–29)
Calcium: 9.2 mg/dL (ref 8.7–10.3)
Chloride: 103 mmol/L (ref 96–106)
Creatinine, Ser: 0.74 mg/dL (ref 0.57–1.00)
Glucose: 120 mg/dL — ABNORMAL HIGH (ref 70–99)
Potassium: 3.5 mmol/L (ref 3.5–5.2)
Sodium: 145 mmol/L — ABNORMAL HIGH (ref 134–144)
eGFR: 78 mL/min/{1.73_m2} (ref >59)

## 2022-11-19 LAB — CBC WITH DIFFERENTIAL/PLATELET
Basophils Absolute: 0.1 10*3/uL (ref 0.0–0.2)
Basos: 1 %
EOS (ABSOLUTE): 0.1 10*3/uL (ref 0.0–0.4)
Eos: 1 %
Hematocrit: 39.9 % (ref 34.0–46.6)
Hemoglobin: 13.2 g/dL (ref 11.1–15.9)
Immature Grans (Abs): 0.1 10*3/uL (ref 0.0–0.1)
Immature Granulocytes: 1 %
Lymphocytes Absolute: 1.7 10*3/uL (ref 0.7–3.1)
Lymphs: 18 %
MCH: 31.1 pg (ref 26.6–33.0)
MCHC: 33.1 g/dL (ref 31.5–35.7)
MCV: 94 fL (ref 79–97)
Monocytes Absolute: 0.6 10*3/uL (ref 0.1–0.9)
Monocytes: 7 %
Neutrophils Absolute: 7 10*3/uL (ref 1.4–7.0)
Neutrophils: 72 %
Platelets: 274 10*3/uL (ref 150–450)
RBC: 4.24 x10E6/uL (ref 3.77–5.28)
RDW: 13.1 % (ref 11.7–15.4)
WBC: 9.5 10*3/uL (ref 3.4–10.8)

## 2022-11-19 LAB — BRAIN NATRIURETIC PEPTIDE: BNP: 56.9 pg/mL (ref 0.0–100.0)

## 2022-11-24 ENCOUNTER — Telehealth: Payer: Self-pay | Admitting: Orthopedic Surgery

## 2022-12-01 ENCOUNTER — Ambulatory Visit: Payer: Medicare Other | Admitting: Orthopaedic Surgery

## 2022-12-01 ENCOUNTER — Encounter: Payer: Self-pay | Admitting: Orthopaedic Surgery

## 2022-12-01 DIAGNOSIS — M545 Low back pain, unspecified: Secondary | ICD-10-CM

## 2022-12-01 NOTE — Patient Instructions (Signed)
Central scheduling [phone] 

## 2022-12-01 NOTE — Progress Notes (Signed)
I am worse.  She has right sided lower back pain.  After standing for about 15 minutes, she has severe pain.  After sitting down for about five minutes the pain goes away.  The cycle repeats itself.  She had MRI in 3-23 showing left L4 nerve root impingement. I will get a new MRI.  She had epidural injection 7-23 which helped.  Lower back is painful, more on the right lower back, no spasm, ROM painful, muscle tone and strength normal.  NV intact.  Encounter Diagnosis  Name Primary?   Lumbar pain Yes   Get MRI.  Continue her ibuprofen.  Return in three weeks.  Call if any problem.  Precautions discussed.  Electronically Signed Darreld Mclean, MD 11/13/20243:09 PM

## 2022-12-02 ENCOUNTER — Telehealth: Payer: Self-pay | Admitting: Orthopaedic Surgery

## 2022-12-02 NOTE — Telephone Encounter (Signed)
Returned the pt's call regarding scheduling a MRI f/u, lvm

## 2022-12-06 ENCOUNTER — Telehealth: Payer: Self-pay | Admitting: Orthopaedic Surgery

## 2022-12-06 MED ORDER — DIAZEPAM 10 MG PO TABS: ORAL_TABLET | ORAL | 0 refills | Status: DC

## 2022-12-06 NOTE — Telephone Encounter (Signed)
Dr. Sanjuan Dame pt - pt lvm stating that Dr. Hilda Lias was supposed to call her something in to help w/her MRI on Wednesday [phone]

## 2022-12-08 ENCOUNTER — Ambulatory Visit (HOSPITAL_COMMUNITY)
Admission: RE | Admit: 2022-12-08 | Discharge: 2022-12-08 | Disposition: A | Discharge status: Home / Self Care | Payer: Medicare Other | Source: Ambulatory Visit | Attending: Orthopaedic Surgery | Admitting: Orthopaedic Surgery

## 2022-12-08 DIAGNOSIS — M545 Low back pain, unspecified: Secondary | ICD-10-CM | POA: Insufficient documentation

## 2022-12-08 DIAGNOSIS — M48061 Spinal stenosis, lumbar region without neurogenic claudication: Secondary | ICD-10-CM | POA: Diagnosis not present

## 2022-12-08 DIAGNOSIS — M5116 Intervertebral disc disorders with radiculopathy, lumbar region: Secondary | ICD-10-CM | POA: Diagnosis not present

## 2022-12-08 DIAGNOSIS — M5115 Intervertebral disc disorders with radiculopathy, thoracolumbar region: Secondary | ICD-10-CM | POA: Diagnosis not present

## 2022-12-08 DIAGNOSIS — M4807 Spinal stenosis, lumbosacral region: Secondary | ICD-10-CM | POA: Diagnosis not present

## 2022-12-22 ENCOUNTER — Encounter: Payer: Self-pay | Admitting: Orthopaedic Surgery

## 2022-12-22 ENCOUNTER — Ambulatory Visit: Payer: Medicare Other | Admitting: Orthopaedic Surgery

## 2022-12-22 ENCOUNTER — Ambulatory Visit (INDEPENDENT_AMBULATORY_CARE_PROVIDER_SITE_OTHER): Payer: Medicare Other

## 2022-12-22 VITALS — Ht 65.0 in | Wt 216.0 lb

## 2022-12-22 DIAGNOSIS — M545 Low back pain, unspecified: Secondary | ICD-10-CM

## 2022-12-22 DIAGNOSIS — Z0000 Encounter for general adult medical examination without abnormal findings: Secondary | ICD-10-CM

## 2022-12-22 NOTE — Progress Notes (Signed)
Subjective:   ALAJA Bradford is a 89 y.o. female who presents for Medicare Annual (Subsequent) preventive examination.  Visit Complete: Virtual I connected with  Anna Bradford on 12/22/22 by a audio enabled telemedicine application and verified that I am speaking with the correct person using two identifiers.  Patient Location: Home  Provider Location: Home Office  I discussed the limitations of evaluation and management by telemedicine. The patient expressed understanding and agreed to proceed.  Vital Signs: Because this visit was a virtual/telehealth visit, some criteria may be missing or patient reported. Any vitals not documented were not able to be obtained and vitals that have been documented are patient reported.  Cardiac Risk Factors include: advanced age (>29men, >73 women);hypertension     Objective:    Today's Vitals   12/22/22 1554  Weight: 216 lb (98 kg)  Height: 5\' 5"  (1.651 m)   Body mass index is 35.94 kg/m.     12/22/2022    4:02 PM 12/14/2021    2:11 PM 09/17/2021    1:44 PM 08/31/2021   11:04 AM 11/10/2020    1:40 PM 04/24/2020   10:41 PM 04/20/2020    5:59 PM  Advanced Directives  Does Patient Have a Medical Advance Directive? No No No No Yes No No  Type of Agricultural consultant;Living will    Copy of Healthcare Power of Attorney in Chart?     No - copy requested    Would patient like information on creating a medical advance directive? Yes (MAU/Ambulatory/Procedural Areas - Information given) No - Patient declined  No - Patient declined  No - Patient declined     Current Medications (verified) Outpatient Encounter Medications as of 12/22/2022  Medication Sig   acetaminophen (TYLENOL) 325 MG tablet Take 650 mg by mouth daily with lunch.   acetaminophen (TYLENOL) 500 MG tablet Take 2 tablets (1,000 mg total) by mouth every 6 (six) hours.   albuterol (VENTOLIN HFA) 108 (90 Base) MCG/ACT inhaler Inhale 2 puffs into the lungs every  6 (six) hours as needed for wheezing or shortness of breath.   AMBULATORY NON FORMULARY MEDICATION Take 1 capsule by mouth daily. Medication Name: Glolo- OTC weight loss   amitriptyline (ELAVIL) 50 MG tablet Take 1 tablet (50 mg total) by mouth at bedtime.   amLODipine (NORVASC) 10 MG tablet Take 1 tablet (10 mg total) by mouth daily.   Ascorbic Acid (VITAMIN C) 1000 MG tablet Take 1,000 mg by mouth daily.    baclofen (LIORESAL) 10 MG tablet Take 1 tablet (10 mg total) by mouth at bedtime as needed for muscle spasms.   Cholecalciferol (VITAMIN D-3) 1000 UNITS CAPS Take 2,000 Units by mouth daily.   clobetasol cream (TEMOVATE) 0.05 % Apply topically 2 (two) times daily.   diazepam (VALIUM) 10 MG tablet Take one tablet an hour before the MRI.  Do not drive a car, have someone drive you.   diphenhydrAMINE (BENADRYL) 25 MG tablet Take 25 mg by mouth daily as needed for allergies.   famotidine (PEPCID) 20 MG tablet Take 1 tablet (20 mg total) by mouth 2 (two) times daily.   losartan-hydrochlorothiazide (HYZAAR) 100-25 MG tablet Take 1 tablet by mouth daily.   magnesium hydroxide (MILK OF MAGNESIA) 400 MG/5ML suspension Take 15 mLs by mouth every other day.   methocarbamol (ROBAXIN) 500 MG tablet Take 500 mg by mouth 4 (four) times daily.   Multiple Minerals-Vitamins (CALCIUM-MAGNESIUM-ZINC-D3 PO) Take 1  tablet by mouth daily.   Multiple Vitamin (MULTIVITAMIN) tablet Take 1 tablet by mouth daily.   nystatin cream (MYCOSTATIN) Apply 1 application  topically daily as needed for dry skin.   Polyethyl Glycol-Propyl Glycol (SYSTANE OP) Place 1 drop into both eyes daily as needed (dry eyes).   Potassium 99 MG TABS Take 99 mg by mouth daily.   rOPINIRole (REQUIP) 2 MG tablet TAKE ONE TABLET AT BEDTIME FOR LEG CRAMPS   triamcinolone cream (KENALOG) 0.1 % Apply 1 application topically 2 (two) times daily. (Patient taking differently: Apply 1 application  topically daily as needed (irritation/itching).)   zinc  gluconate 50 MG tablet Take 50 mg by mouth daily.   predniSONE (DELTASONE) 20 MG tablet 2 po at same time daily for 5 days (Patient not taking: Reported on 12/22/2022)   No facility-administered encounter medications on file as of 12/22/2022.    Allergies (verified) Levofloxacin, Nitrofurantoin, Macrolides and ketolides, Codeine, and Sulfa antibiotics   History: Past Medical History:  Diagnosis Date   Arthritis    Cancer (HCC) 1996   left breast   Essential hypertension, benign    GERD (gastroesophageal reflux disease)    History of kidney stones    Past Surgical History:  Procedure Laterality Date   ABDOMINAL HYSTERECTOMY     Absess Off Intestine     cataract surgery Left 08/10/2011   cataract surgery Right 11/01/2011   CHOLECYSTECTOMY     1972   COLONOSCOPY N/A 02/06/2015   Procedure: COLONOSCOPY;  Surgeon: Malissa Hippo, MD;  Location: [facility];  Service: Endoscopy;  Laterality: N/A;  1015   FRACTURE SURGERY  1990   Right ankle ORIF   GALLBLADDER SURGERY     Rupture Lower Stomach   HERNIA REPAIR     Hysterectomy ---- unknown     1980   Lapoma Tumor     Left Breast Masectomy     1986   MASS EXCISION N/A 01/30/2018   Procedure: EXCISION MASS 3CM ON BACK AND 3CM ON ABDOMEN;  Surgeon: Lucretia Roers, MD;  Location: [facility];  Service: General;  Laterality: N/A;   Right Ankle Surgery     TOTAL KNEE ARTHROPLASTY Left 09/09/2021   Procedure: TOTAL KNEE ARTHROPLASTY;  Surgeon: Dannielle Huh, MD;  Location: [facility];  Service: Orthopedics;  Laterality: Left;   Family History  Problem Relation Age of Onset   Cancer Mother    Coronary artery disease Mother    Heart disease Sister    Social History   Socioeconomic History   Marital status: Widowed    Spouse name: Not on file   Number of children: 2   Years of education: 12-failed the 4th grade and had to repeat   Highest education level: 11th grade  Occupational History   Occupation: retired  Tobacco Use    Smoking status: Never   Smokeless tobacco: Never  Vaping Use   Vaping status: Never Used  Substance and Sexual Activity   Alcohol use: No   Drug use: No   Sexual activity: Not Currently    Birth control/protection: Surgical  Other Topics Concern   Not on file  Social History Narrative   Not on file   Social Determinants of Health   Financial Resource Strain: Low Risk  (12/22/2022)   Overall Financial Resource Strain (CARDIA)    Difficulty of Paying Living Expenses: Not hard at all  Food Insecurity: No Food Insecurity (12/22/2022)   Hunger Vital Sign    Worried  About Running Out of Food in the Last Year: Never true    Ran Out of Food in the Last Year: Never true  Transportation Needs: No Transportation Needs (12/22/2022)   PRAPARE - Administrator, Civil Service (Medical): No    Lack of Transportation (Non-Medical): No  Physical Activity: Inactive (12/22/2022)   Exercise Vital Sign    Days of Exercise per Week: 0 days    Minutes of Exercise per Session: 0 min  Stress: No Stress Concern Present (12/22/2022)   [employer] of Occupational Health - Occupational Stress Questionnaire    Feeling of Stress : Not at all  Social Connections: Moderately Isolated (12/22/2022)   Social Connection and Isolation Panel [NHANES]    Frequency of Communication with Friends and Family: More than three times a week    Frequency of Social Gatherings with Friends and Family: Three times a week    Attends Religious Services: 1 to 4 times per year    Active Member of Clubs or Organizations: No    Attends Banker Meetings: Never    Marital Status: Widowed    Tobacco Counseling Counseling given: Not Answered   Clinical Intake:  Pre-visit preparation completed: Yes  Pain : No/denies pain     Diabetes: No  How often do you need to have someone help you when you read instructions, pamphlets, or other written materials from your doctor or pharmacy?: 1 -  Never  Interpreter Needed?: No  Information entered by :: Kandis Fantasia LPN   Activities of Daily Living    12/22/2022    4:02 PM  In your present state of health, do you have any difficulty performing the following activities:  Hearing? 0  Vision? 0  Difficulty concentrating or making decisions? 0  Walking or climbing stairs? 1  Dressing or bathing? 0  Doing errands, shopping? 0  Preparing Food and eating ? N  Using the Toilet? N  In the past six months, have you accidently leaked urine? Y  Do you have problems with loss of bowel control? N  Managing your Medications? N  Managing your Finances? N  Housekeeping or managing your Housekeeping? N    Patient Care Team: Dettinger, Elige Radon, MD as PCP - General (Family Medicine)  Indicate any recent Medical Services you may have received from other than [facility] providers in the past year (date may be approximate).     Assessment:   This is a routine wellness examination for Anna Bradford.  Hearing/Vision screen Hearing Screening - Comments:: Some hearing loss  Vision Screening - Comments:: Wears rx glasses - up to date with routine eye exams with [facility]      Goals Addressed   None   Depression Screen    12/22/2022    4:01 PM 10/14/2022    9:55 AM 09/28/2022    8:21 AM 07/23/2022    2:59 PM 12/31/2021    2:08 PM 12/14/2021    2:09 PM 10/29/2021    1:39 PM  PHQ 2/9 Scores  PHQ - 2 Score 0 0 0 0  0 0  Exception Documentation     Patient refusal      Fall Risk    12/22/2022    4:01 PM 10/14/2022    9:55 AM 09/28/2022    8:21 AM 07/23/2022    2:59 PM 12/14/2021    2:06 PM  Fall Risk   Falls in the past year? 0 0 0 0 1  Number falls  in past yr: 0  0  1  Injury with Fall? 0  0  1  Risk for fall due to : No Fall Risks  No Fall Risks;Impaired balance/gait  History of fall(s);Impaired balance/gait;Orthopedic patient  Follow up Falls prevention discussed;Education provided;Falls evaluation completed  Falls evaluation  completed;Education provided  Education provided;Falls prevention discussed    MEDICARE RISK AT HOME: Medicare Risk at Home Any stairs in or around the home?: No If so, are there any without handrails?: No Home free of loose throw rugs in walkways, pet beds, electrical cords, etc?: Yes Adequate lighting in your home to reduce risk of falls?: Yes Life alert?: No Use of a cane, walker or w/c?: Yes Grab bars in the bathroom?: Yes Shower chair or bench in shower?: No Elevated toilet seat or a handicapped toilet?: Yes  TIMED UP AND GO:  Was the test performed?  No    Cognitive Function:        12/22/2022    4:02 PM 12/14/2021    2:11 PM 11/09/2019   11:34 AM 11/07/2018    1:45 PM  6CIT Screen  What Year? 0 points 0 points 0 points 0 points  What month? 0 points 0 points 0 points 0 points  What time? 0 points 0 points 0 points 0 points  Count back from 20 0 points 0 points 0 points 0 points  Months in reverse 2 points 0 points 0 points 0 points  Repeat phrase 0 points 0 points 0 points 0 points  Total Score 2 points 0 points 0 points 0 points    Immunizations Immunization History  Administered Date(s) Administered   Fluad Quad(high Dose 65+) 10/30/2020, 02/19/2022   Fluad Trivalent(High Dose 65+) 11/18/2022   Influenza Inj Mdck Quad With Preservative 12/07/2019   Influenza Split 11/09/2016   Influenza, High Dose Seasonal PF 12/03/2015, 11/28/2017   Influenza, Seasonal, Injecte, Preservative Fre 10/23/2014   Influenza,inj,Quad PF,6+ Mos 10/23/2014, 10/27/2018   Influenza,trivalent, recombinat, inj, PF 10/22/2016, 11/09/2016   Influenza-Unspecified 12/08/2019, 10/30/2020   Moderna Sars-Covid-2 Vaccination 02/27/2019, 04/03/2019   Pneumococcal Conjugate-13 12/16/2014   Pneumococcal Polysaccharide-23 11/14/2004   Tdap 07/03/2012   Zoster Recombinant(Shingrix) 01/30/2021, 05/07/2021    TDAP status: Due, Education has been provided regarding the importance of this vaccine.  Advised may receive this vaccine at local pharmacy or Health Dept. Aware to provide a copy of the vaccination record if obtained from local pharmacy or Health Dept. Verbalized acceptance and understanding.  Flu Vaccine status: Up to date  Pneumococcal vaccine status: Up to date  Covid-19 vaccine status: Information provided on how to obtain vaccines.   Qualifies for Shingles Vaccine? Yes   Zostavax completed Yes   Shingrix Completed?: Yes  Screening Tests Health Maintenance  Topic Date Due   DTaP/Tdap/Td (2 - Td or Tdap) 07/04/2022   COVID-19 Vaccine (3 - 2023-24 season) 09/19/2022   Medicare Annual Wellness (AWV)  12/22/2023   Pneumonia Vaccine 60+ Years old  Completed   INFLUENZA VACCINE  Completed   DEXA SCAN  Completed   Zoster Vaccines- Shingrix  Completed   HPV VACCINES  Aged Out    Health Maintenance  Health Maintenance Due  Topic Date Due   DTaP/Tdap/Td (2 - Td or Tdap) 07/04/2022   COVID-19 Vaccine (3 - 2023-24 season) 09/19/2022    Colorectal cancer screening: No longer required.   Mammogram status: No longer required due to age and preference.  Bone Density status: Completed 12/24/19. Results reflect: Bone density results: OSTEOPOROSIS.  Repeat every 2 years.  Lung Cancer Screening: (Low Dose CT Chest recommended if Age 81-80 years, 20 pack-year currently smoking OR have quit w/in 15years.) does not qualify.   Lung Cancer Screening Referral: n/a  Additional Screening:  Hepatitis C Screening: does not qualify  Vision Screening: Recommended annual ophthalmology exams for early detection of glaucoma and other disorders of the eye. Is the patient up to date with their annual eye exam?  Yes  Who is the provider or what is the name of the office in which the patient attends annual eye exams? [facility]  If pt is not established with a provider, would they like to be referred to a provider to establish care? No .   Dental Screening: Recommended annual dental  exams for proper oral hygiene  Community Resource Referral / Chronic Care Management: CRR required this visit?  No   CCM required this visit?  No     Plan:     I have personally reviewed and noted the following in the patient's chart:   Medical and social history Use of alcohol, tobacco or illicit drugs  Current medications and supplements including opioid prescriptions. Patient is not currently taking opioid prescriptions. Functional ability and status Nutritional status Physical activity Advanced directives List of other physicians Hospitalizations, surgeries, and ER visits in previous 12 months Vitals Screenings to include cognitive, depression, and falls Referrals and appointments  In addition, I have reviewed and discussed with patient certain preventive protocols, quality metrics, and best practice recommendations. A written personalized care plan for preventive services as well as general preventive health recommendations were provided to patient.     Kandis Fantasia [place], [place]   [id number]/[id number]   After Visit Summary: (Mail) Due to this being a telephonic visit, the after visit summary with patients personalized plan was offered to patient via mail   Nurse Notes: Patient would like to discuss problems with urinary incontinence and is in need of a handicap placard at upcoming appointment

## 2022-12-22 NOTE — Progress Notes (Signed)
I hurt.  Her MRI of the lumbar spine showed: IMPRESSION: 1. Decreased size of a disc extrusion at L3-4 with improved left lateral recess patency. 2. Unchanged disc degeneration elsewhere with mild multilevel spinal stenosis and mild-to-moderate lateral recess and neural foraminal stenosis as detailed above.  I have explained the findings to her.  I will have her see the neurosurgeon again.  She saw him last year and he recommended an epidural but she declined.  She is no willing to see him again and have him make a recommendation.  I have independently reviewed the MRI.     She has good motion of her back but it is tender, no spasm. NV intact.  Gait is slow and steady.  Muscle tone and strength normal.  Encounter Diagnosis  Name Primary?   Lumbar pain Yes   I will have her see the neurosurgeon.  Continue the ibuprofen.  Return in six weeks.  Call if any problem.  Precautions discussed.  Electronically Signed Darreld Mclean, MD 12/4/20241:48 PM

## 2022-12-22 NOTE — Patient Instructions (Signed)
Anna Bradford , Thank you for taking time to come for your Medicare Wellness Visit. I appreciate your ongoing commitment to your health goals. Please review the following plan we discussed and let me know if I can assist you in the future.   Referrals/Orders/Follow-Ups/Clinician Recommendations: Aim for 30 minutes of exercise or brisk walking, 6-8 glasses of water, and 5 servings of fruits and vegetables each day.  This is a list of the screening recommended for you and due dates:  Health Maintenance  Topic Date Due   DTaP/Tdap/Td vaccine (2 - Td or Tdap) 07/04/2022   COVID-19 Vaccine (3 - 2023-24 season) 09/19/2022   Medicare Annual Wellness Visit  12/22/2023   Pneumonia Vaccine  Completed   Flu Shot  Completed   DEXA scan (bone density measurement)  Completed   Zoster (Shingles) Vaccine  Completed   HPV Vaccine  Aged Out    Advanced directives: (ACP Link)Information on Advanced Care Planning can be found at [facility] Advance Health Care Directives Advance Health Care Directives ([email or web address]   Next Medicare Annual Wellness Visit scheduled for next year: Yes

## 2023-01-06 ENCOUNTER — Other Ambulatory Visit: Payer: Self-pay | Admitting: Family Medicine

## 2023-01-06 DIAGNOSIS — M51379 Other intervertebral disc degeneration, lumbosacral region without mention of lumbar back pain or lower extremity pain: Secondary | ICD-10-CM

## 2023-01-06 DIAGNOSIS — M545 Low back pain, unspecified: Secondary | ICD-10-CM

## 2023-01-06 DIAGNOSIS — I10 Essential (primary) hypertension: Secondary | ICD-10-CM

## 2023-01-21 ENCOUNTER — Ambulatory Visit: Payer: Medicare Other | Admitting: Family Medicine

## 2023-01-21 ENCOUNTER — Encounter: Payer: Self-pay | Admitting: Family Medicine

## 2023-01-21 VITALS — BP 182/91 | HR 96 | Ht 65.0 in | Wt 221.0 lb

## 2023-01-21 DIAGNOSIS — I10 Essential (primary) hypertension: Secondary | ICD-10-CM

## 2023-01-21 DIAGNOSIS — Z23 Encounter for immunization: Secondary | ICD-10-CM

## 2023-01-21 DIAGNOSIS — N3281 Overactive bladder: Secondary | ICD-10-CM

## 2023-01-21 DIAGNOSIS — R110 Nausea: Secondary | ICD-10-CM | POA: Diagnosis not present

## 2023-01-21 DIAGNOSIS — S161XXD Strain of muscle, fascia and tendon at neck level, subsequent encounter: Secondary | ICD-10-CM

## 2023-01-21 DIAGNOSIS — M51379 Other intervertebral disc degeneration, lumbosacral region without mention of lumbar back pain or lower extremity pain: Secondary | ICD-10-CM

## 2023-01-21 DIAGNOSIS — M545 Low back pain, unspecified: Secondary | ICD-10-CM | POA: Diagnosis not present

## 2023-01-21 DIAGNOSIS — K219 Gastro-esophageal reflux disease without esophagitis: Secondary | ICD-10-CM

## 2023-01-21 DIAGNOSIS — R739 Hyperglycemia, unspecified: Secondary | ICD-10-CM | POA: Diagnosis not present

## 2023-01-21 LAB — LIPID PANEL

## 2023-01-21 LAB — BAYER DCA HB A1C WAIVED: HB A1C (BAYER DCA - WAIVED): 5.2 % (ref 4.8–5.6)

## 2023-01-21 MED ORDER — METHYLPREDNISOLONE ACETATE 80 MG/ML IJ SUSP
80.0000 mg | Freq: Once | INTRAMUSCULAR | Status: AC
  Administered 2023-01-21: 80 mg via INTRAMUSCULAR

## 2023-01-21 MED ORDER — AMLODIPINE BESYLATE 10 MG PO TABS: 10.0000 mg | ORAL_TABLET | Freq: Every day | ORAL | 3 refills | Status: DC

## 2023-01-21 MED ORDER — AMITRIPTYLINE HCL 50 MG PO TABS: 50.0000 mg | ORAL_TABLET | Freq: Every day | ORAL | 1 refills | Status: DC

## 2023-01-21 MED ORDER — BACLOFEN 10 MG PO TABS: 10.0000 mg | ORAL_TABLET | Freq: Every evening | ORAL | 1 refills | Status: AC | PRN

## 2023-01-21 MED ORDER — SOLIFENACIN SUCCINATE 5 MG PO TABS: 5.0000 mg | ORAL_TABLET | Freq: Every day | ORAL | 1 refills | Status: DC

## 2023-01-21 MED ORDER — LOSARTAN POTASSIUM-HCTZ 100-25 MG PO TABS: 1.0000 | ORAL_TABLET | Freq: Every day | ORAL | 3 refills | Status: DC

## 2023-01-21 MED ORDER — FAMOTIDINE 20 MG PO TABS: 20.0000 mg | ORAL_TABLET | Freq: Two times a day (BID) | ORAL | 3 refills | Status: DC

## 2023-01-21 NOTE — Addendum Note (Signed)
 Addended by: Arville Care on: 01/21/2023 03:50 PM   Modules accepted: Level of Service

## 2023-01-21 NOTE — Progress Notes (Signed)
 BP (!) 182/91   Pulse 96   Ht 5' 5 (1.651 m)   Wt 274 lb (124.3 kg)   SpO2 98%   BMI 45.60 kg/m    Subjective:   Patient ID: Anna Bradford, female    DOB: 07-06-1933, 89 y.o.   MRN: [id number]  HPI: Anna Bradford is a 89 y.o. female presenting on 01/21/2023 for Medical Management of Chronic Issues and Hypertension   HPI Hypertension Patient is currently on amlodipine  and losartan  hydrochlorothiazide , and their blood pressure today is 156/80, she has not been checking his home as much recently. Patient denies any lightheadedness or dizziness. Patient denies headaches, blurred vision, chest pains, shortness of breath, or weakness. Denies any side effects from medication and is content with current medication.   GERD Patient is currently on famotidine .  She denies any major symptoms or abdominal pain or belching or burping. She denies any blood in her stool or lightheadedness or dizziness.   Long-term muscle strains and myalgias Patient takes amitriptyline  and baclofen  for long-term muscle strains and myalgias and they do help some.  She was given a shot in her lower back today because it has been bothering her recently especially after the holidays being up on her feet cooking a lot.  It is her same right lower back and always bothers her.  She does see an orthopedic and spinal specialist  Overactive bladder Patient is coming in for overactive bladder.  She says she tried the Myrbetriq  for short period of time, 1 pill or 2 and then she feels like it locked her up and she had lots of side effects.  She did not like it and did not take it any further.  She has urinary leakage and wears depends because she has urgency as well.  This been going on for years.  Relevant past medical, surgical, family and social history reviewed and updated as indicated. Interim medical history since our last visit reviewed. Allergies and medications reviewed and updated.  Review of Systems  Constitutional:   Negative for chills and fever.  Eyes:  Negative for redness and visual disturbance.  Respiratory:  Negative for chest tightness and shortness of breath.   Cardiovascular:  Negative for chest pain and leg swelling.  Genitourinary:  Positive for frequency and urgency. Negative for difficulty urinating and dysuria.  Musculoskeletal:  Positive for arthralgias, back pain and myalgias. Negative for gait problem.  Skin:  Negative for rash.  Neurological:  Negative for dizziness, light-headedness and headaches.  Psychiatric/Behavioral:  Negative for agitation and behavioral problems.   All other systems reviewed and are negative.   Per HPI unless specifically indicated above   Allergies as of 01/21/2023       Reactions   Levofloxacin    Other (See Comments) tendonitis   Nitrofurantoin    Other reaction(s): Other (See Comments) Bleeding thru bowels   Macrolides And Ketolides    Bleeding thru bowels   Codeine Nausea And Vomiting   Sulfa Antibiotics Nausea And Vomiting        Medication List        Accurate as of January 21, 2023  3:50 PM. If you have any questions, ask your nurse or doctor.          STOP taking these medications    AMBULATORY NON FORMULARY MEDICATION Stopped by: Fonda LABOR Akera Snowberger   CALCIUM-MAGNESIUM-ZINC-D3 PO Stopped by: Fonda LABOR Vivika Poythress   diazepam  10 MG tablet Commonly known as: VALIUM  Stopped by: Fonda  A Zeno Hickel   predniSONE  20 MG tablet Commonly known as: DELTASONE  Stopped by: Fonda LABOR Tinea Nobile   zinc gluconate 50 MG tablet Stopped by: Fonda LABOR Meerab Maselli       TAKE these medications    acetaminophen  325 MG tablet Commonly known as: TYLENOL  Take 650 mg by mouth daily with lunch. What changed: Another medication with the same name was removed. Continue taking this medication, and follow the directions you see here. Changed by: Fonda LABOR Deegan Valentino   albuterol  108 (90 Base) MCG/ACT inhaler Commonly known as: VENTOLIN  HFA Inhale 2  puffs into the lungs every 6 (six) hours as needed for wheezing or shortness of breath.   amitriptyline  50 MG tablet Commonly known as: ELAVIL  Take 1 tablet (50 mg total) by mouth at bedtime.   amLODipine  10 MG tablet Commonly known as: NORVASC  Take 1 tablet (10 mg total) by mouth daily.   baclofen  10 MG tablet Commonly known as: LIORESAL  Take 1 tablet (10 mg total) by mouth at bedtime as needed for muscle spasms.   clobetasol  cream 0.05 % Commonly known as: TEMOVATE  Apply topically 2 (two) times daily.   diphenhydrAMINE 25 MG tablet Commonly known as: BENADRYL Take 25 mg by mouth daily as needed for allergies.   famotidine  20 MG tablet Commonly known as: Pepcid  Take 1 tablet (20 mg total) by mouth 2 (two) times daily.   losartan -hydrochlorothiazide  100-25 MG tablet Commonly known as: HYZAAR Take 1 tablet by mouth daily.   magnesium hydroxide 400 MG/5ML suspension Commonly known as: MILK OF MAGNESIA Take 15 mLs by mouth every other day.   methocarbamol 500 MG tablet Commonly known as: ROBAXIN Take 500 mg by mouth 4 (four) times daily.   multivitamin tablet Take 1 tablet by mouth daily.   nystatin cream Commonly known as: MYCOSTATIN Apply 1 application  topically daily as needed for dry skin.   Potassium 99 MG Tabs Take 99 mg by mouth daily.   rOPINIRole  2 MG tablet Commonly known as: REQUIP  TAKE ONE TABLET AT BEDTIME FOR LEG CRAMPS   solifenacin  5 MG tablet Commonly known as: VESIcare  Take 1 tablet (5 mg total) by mouth daily. Started by: Fonda LABOR Vetra Shinall   SYSTANE OP Place 1 drop into both eyes daily as needed (dry eyes).   triamcinolone  cream 0.1 % Commonly known as: KENALOG  Apply 1 application topically 2 (two) times daily. What changed:  when to take this reasons to take this   vitamin C 1000 MG tablet Take 1,000 mg by mouth daily.   Vitamin D -3 25 MCG (1000 UT) Caps Take 2,000 Units by mouth daily.         Objective:   BP (!)  182/91   Pulse 96   Ht 5' 5 (1.651 m)   Wt 274 lb (124.3 kg)   SpO2 98%   BMI 45.60 kg/m   Wt Readings from Last 3 Encounters:  01/21/23 274 lb (124.3 kg)  12/22/22 216 lb (98 kg)  11/18/22 216 lb 6 oz (98.1 kg)    Physical Exam Vitals and nursing note reviewed.  Constitutional:      General: She is not in acute distress.    Appearance: She is well-developed. She is not diaphoretic.  Eyes:     Conjunctiva/sclera: Conjunctivae normal.  Cardiovascular:     Rate and Rhythm: Normal rate and regular rhythm.     Heart sounds: Normal heart sounds. No murmur heard. Pulmonary:     Effort: Pulmonary effort is normal. No respiratory  distress.     Breath sounds: Normal breath sounds. No wheezing.  Musculoskeletal:        General: No swelling.  Skin:    General: Skin is warm and dry.     Findings: No rash.  Neurological:     Mental Status: She is alert and oriented to person, place, and time.     Coordination: Coordination normal.  Psychiatric:        Behavior: Behavior normal.       Assessment & Plan:   Problem List Items Addressed This Visit       Cardiovascular and Mediastinum   Essential hypertension, benign - Primary   Relevant Medications   amLODipine  (NORVASC ) 10 MG tablet   losartan -hydrochlorothiazide  (HYZAAR) 100-25 MG tablet   Other Relevant Orders   CMP14+EGFR   Lipid panel     Digestive   GERD (gastroesophageal reflux disease)   Relevant Medications   famotidine  (PEPCID ) 20 MG tablet   Other Relevant Orders   CBC with Differential/Platelet   Other Visit Diagnoses       Strain of neck muscle, subsequent encounter       Relevant Medications   baclofen  (LIORESAL ) 10 MG tablet     Nausea       Relevant Medications   famotidine  (PEPCID ) 20 MG tablet     Elevated blood sugar       Relevant Orders   Bayer DCA Hb A1c Waived     Low back pain, unspecified back pain laterality, unspecified chronicity, unspecified whether sciatica present       Relevant  Medications   baclofen  (LIORESAL ) 10 MG tablet   amitriptyline  (ELAVIL ) 50 MG tablet   methylPREDNISolone  acetate (DEPO-MEDROL ) injection 80 mg (Start on 01/21/2023  4:00 PM)     Degenerative disc disease at L5-S1 level       Relevant Medications   baclofen  (LIORESAL ) 10 MG tablet   amitriptyline  (ELAVIL ) 50 MG tablet   methylPREDNISolone  acetate (DEPO-MEDROL ) injection 80 mg (Start on 01/21/2023  4:00 PM)     OAB (overactive bladder)       Relevant Medications   solifenacin  (VESICARE ) 5 MG tablet       Will give Depo-Medrol  IM today for lower back to see if it helps some and she is going to continue to see her specialist.  Refilled amitriptyline  which she does take that helps. Will try and start her on Vesicare  to see if that helps with her bladder issues.  Blood pressure elevated today but she will keep a close eye on it over the next 2 weeks and send us  the numbers.  Follow up plan: Return in about 6 months (around 07/21/2023), or if symptoms worsen or fail to improve, for Hypertension and GERD and back pain.  Counseling provided for all of the vaccine components Orders Placed This Encounter  Procedures   CBC with Differential/Platelet   CMP14+EGFR   Lipid panel   Bayer DCA Hb A1c Waived    Fonda Levins, MD [facility] 01/21/2023, 3:50 PM

## 2023-01-22 LAB — CMP14+EGFR
ALT: 15 IU/L (ref 0–32)
AST: 22 IU/L (ref 0–40)
Albumin: 3.8 g/dL (ref 3.7–4.7)
Alkaline Phosphatase: 102 IU/L (ref 44–121)
BUN/Creatinine Ratio: 17 (ref 12–28)
BUN: 12 mg/dL (ref 8–27)
Bilirubin Total: 0.4 mg/dL (ref 0.0–1.2)
CO2: 29 mmol/L (ref 20–29)
Calcium: 9.4 mg/dL (ref 8.7–10.3)
Chloride: 100 mmol/L (ref 96–106)
Creatinine, Ser: 0.72 mg/dL (ref 0.57–1.00)
Globulin, Total: 2.7 g/dL (ref 1.5–4.5)
Glucose: 108 mg/dL — ABNORMAL HIGH (ref 70–99)
Potassium: 4 mmol/L (ref 3.5–5.2)
Sodium: 143 mmol/L (ref 134–144)
Total Protein: 6.5 g/dL (ref 6.0–8.5)
eGFR: 81 mL/min/{1.73_m2} (ref >59)

## 2023-01-22 LAB — CBC WITH DIFFERENTIAL/PLATELET
Basophils Absolute: 0.1 10*3/uL (ref 0.0–0.2)
Basos: 1 %
EOS (ABSOLUTE): 0.4 10*3/uL (ref 0.0–0.4)
Eos: 4 %
Hematocrit: 40.8 % (ref 34.0–46.6)
Hemoglobin: 13.6 g/dL (ref 11.1–15.9)
Immature Grans (Abs): 0.1 10*3/uL (ref 0.0–0.1)
Immature Granulocytes: 1 %
Lymphocytes Absolute: 2.5 10*3/uL (ref 0.7–3.1)
Lymphs: 27 %
MCH: 30.4 pg (ref 26.6–33.0)
MCHC: 33.3 g/dL (ref 31.5–35.7)
MCV: 91 fL (ref 79–97)
Monocytes Absolute: 0.7 10*3/uL (ref 0.1–0.9)
Monocytes: 7 %
Neutrophils Absolute: 5.5 10*3/uL (ref 1.4–7.0)
Neutrophils: 60 %
Platelets: 296 10*3/uL (ref 150–450)
RBC: 4.48 x10E6/uL (ref 3.77–5.28)
RDW: 12.6 % (ref 11.7–15.4)
WBC: 9.2 10*3/uL (ref 3.4–10.8)

## 2023-01-22 LAB — LIPID PANEL
Cholesterol, Total: 161 mg/dL (ref 100–199)
HDL: 36 mg/dL — ABNORMAL LOW (ref >39)
LDL CALC COMMENT:: 4.5 ratio — ABNORMAL HIGH (ref 0.0–4.4)
LDL Chol Calc (NIH): 84 mg/dL (ref 0–99)
Triglycerides: 246 mg/dL — ABNORMAL HIGH (ref 0–149)
VLDL Cholesterol Cal: 41 mg/dL — ABNORMAL HIGH (ref 5–40)

## 2023-02-02 ENCOUNTER — Ambulatory Visit: Payer: Medicare Other | Admitting: Orthopaedic Surgery

## 2023-02-16 ENCOUNTER — Encounter: Payer: Self-pay | Admitting: Family Medicine

## 2023-02-16 ENCOUNTER — Ambulatory Visit: Payer: Medicare Other | Admitting: Family Medicine

## 2023-02-16 VITALS — BP 151/79 | HR 94 | Ht 65.0 in | Wt 214.0 lb

## 2023-02-16 DIAGNOSIS — R32 Unspecified urinary incontinence: Secondary | ICD-10-CM | POA: Diagnosis not present

## 2023-02-16 NOTE — Progress Notes (Signed)
BP (!) 151/79   Pulse 94   Ht 5\' 5"  (1.651 m)   Wt 214 lb (97.1 kg)   SpO2 94%   BMI 35.61 kg/m    Subjective:   Patient ID: Anna Bradford, female    DOB: November 17, 1933, 89 y.o.   MRN: [id number]  HPI: Anna Bradford is a 89 y.o. female presenting on 02/16/2023 for Constipation (Worse since starting Vesicare on 1/3. Medication also not helping urinary incontinence)   HPI Patient is coming in today for recheck on bladder medicine.  She has been taking the Vesicare but is causing constipation so she stopped it.  She has previously tried [employer] and did not do well on that.  She actually feels like it made it worse.  She is still having a lot of urinary leakage and drainage.  She has a lot of frequency as well.  She has not had a bowel movement in 3 days.  Relevant past medical, surgical, family and social history reviewed and updated as indicated. Interim medical history since our last visit reviewed. Allergies and medications reviewed and updated.  Review of Systems  Constitutional:  Negative for chills and fever.  HENT:  Negative for congestion, ear discharge and ear pain.   Eyes:  Negative for redness and visual disturbance.  Respiratory:  Negative for chest tightness and shortness of breath.   Cardiovascular:  Negative for chest pain and leg swelling.  Genitourinary:  Positive for frequency and urgency. Negative for difficulty urinating and dysuria.  Musculoskeletal:  Negative for back pain and gait problem.  Skin:  Negative for rash.  Neurological:  Negative for light-headedness and headaches.  Psychiatric/Behavioral:  Negative for agitation and behavioral problems.   All other systems reviewed and are negative.   Per HPI unless specifically indicated above   Allergies as of 02/16/2023       Reactions   Levofloxacin    Other (See Comments) tendonitis   Nitrofurantoin    Other reaction(s): Other (See Comments) Bleeding thru bowels   Macrolides And Ketolides    Bleeding  thru bowels   Codeine Nausea And Vomiting   Sulfa Antibiotics Nausea And Vomiting        Medication List        Accurate as of February 16, 2023  3:43 PM. If you have any questions, ask your nurse or doctor.          STOP taking these medications    solifenacin 5 MG tablet Commonly known as: VESIcare Stopped by: Elige Radon Yannis Gumbs       TAKE these medications    acetaminophen 325 MG tablet Commonly known as: TYLENOL Take 650 mg by mouth daily with lunch.   albuterol 108 (90 Base) MCG/ACT inhaler Commonly known as: VENTOLIN HFA Inhale 2 puffs into the lungs every 6 (six) hours as needed for wheezing or shortness of breath.   amitriptyline 50 MG tablet Commonly known as: ELAVIL Take 1 tablet (50 mg total) by mouth at bedtime.   amLODipine 10 MG tablet Commonly known as: NORVASC Take 1 tablet (10 mg total) by mouth daily.   baclofen 10 MG tablet Commonly known as: LIORESAL Take 1 tablet (10 mg total) by mouth at bedtime as needed for muscle spasms.   clobetasol cream 0.05 % Commonly known as: TEMOVATE Apply topically 2 (two) times daily.   diphenhydrAMINE 25 MG tablet Commonly known as: BENADRYL Take 25 mg by mouth daily as needed for allergies.   famotidine 20 MG  tablet Commonly known as: Pepcid Take 1 tablet (20 mg total) by mouth 2 (two) times daily.   losartan-hydrochlorothiazide 100-25 MG tablet Commonly known as: HYZAAR Take 1 tablet by mouth daily.   magnesium hydroxide 400 MG/5ML suspension Commonly known as: MILK OF MAGNESIA Take 15 mLs by mouth every other day.   methocarbamol 500 MG tablet Commonly known as: ROBAXIN Take 500 mg by mouth 4 (four) times daily.   multivitamin tablet Take 1 tablet by mouth daily.   nystatin cream Commonly known as: MYCOSTATIN Apply 1 application  topically daily as needed for dry skin.   Potassium 99 MG Tabs Take 99 mg by mouth daily.   rOPINIRole 2 MG tablet Commonly known as: REQUIP TAKE ONE  TABLET AT BEDTIME FOR LEG CRAMPS   SYSTANE OP Place 1 drop into both eyes daily as needed (dry eyes).   triamcinolone cream 0.1 % Commonly known as: KENALOG Apply 1 application topically 2 (two) times daily. What changed:  when to take this reasons to take this   vitamin C 1000 MG tablet Take 1,000 mg by mouth daily.   Vitamin D-3 25 MCG (1000 UT) Caps Take 2,000 Units by mouth daily.         Objective:   BP (!) 151/79   Pulse 94   Ht 5\' 5"  (1.651 m)   Wt 214 lb (97.1 kg)   SpO2 94%   BMI 35.61 kg/m   Wt Readings from Last 3 Encounters:  02/16/23 214 lb (97.1 kg)  01/21/23 221 lb (100.2 kg)  12/22/22 216 lb (98 kg)    Physical Exam Vitals and nursing note reviewed.  Constitutional:      General: She is not in acute distress.    Appearance: She is well-developed. She is not diaphoretic.  Eyes:     Conjunctiva/sclera: Conjunctivae normal.  Cardiovascular:     Rate and Rhythm: Normal rate and regular rhythm.     Heart sounds: Normal heart sounds. No murmur heard. Pulmonary:     Effort: Pulmonary effort is normal. No respiratory distress.     Breath sounds: Normal breath sounds. No wheezing.  Abdominal:     General: Abdomen is flat. Bowel sounds are normal. There is no distension.     Tenderness: There is abdominal tenderness (Mild right lower quadrant tenderness).  Musculoskeletal:        General: No tenderness. Normal range of motion.  Skin:    General: Skin is warm and dry.     Findings: No rash.  Neurological:     Mental Status: She is alert and oriented to person, place, and time.     Coordination: Coordination normal.  Psychiatric:        Behavior: Behavior normal.       Assessment & Plan:   Problem List Items Addressed This Visit   None Visit Diagnoses       Urinary incontinence, unspecified type    -  Primary   Relevant Orders   Ambulatory referral to Urology       Stop Vesicare because she was having constipation and she feels like  it was making it worse.  Already tried Myrbetriq, will do referral to urology. Follow up plan: Return if symptoms worsen or fail to improve.  Counseling provided for all of the vaccine components Orders Placed This Encounter  Procedures   Ambulatory referral to Urology    Arville Care, MD [facility] 02/16/2023, 3:43 PM

## 2023-03-02 ENCOUNTER — Ambulatory Visit: Payer: Self-pay | Admitting: Family Medicine

## 2023-03-02 NOTE — Telephone Encounter (Signed)
  Chief Complaint: slip and fall, right knee injury Symptoms: right knee pain, swelling Frequency: occurred on Monday, pain worse with certain movements Pertinent Negatives: Patient denies head injury, LOC. Disposition: [] ED /[] Urgent Care (no appt availability in office) / [x] Appointment(In office/virtual)/ []  [facility]/ [] Home Care/ [] Refused Recommended Disposition /[] [place] [facility]/ []  Follow-up with PCP Additional Notes: Patient states she was bent down trying to clean up a but and when she tried to stand up her leg slipped and she went down in to a split. She states she injured her right knee. Patient has been treating with ice and ibuprofen. Patient agreeable to office visit and states she would like to come in on Friday due to weather.  Copied from CRM [id number]. Topic: Clinical - Red Word Triage >> Mar 02, 2023  1:07 PM Clayton Bibles wrote: Red Word that prompted transfer to Nurse Triage: She fell on Monday and hurt her right knee. Now she is having pain when she walks or laying in bed. She did not hit her head. She landed on her right knee Reason for Disposition  [1] High-risk adult (e.g., age > 60 years, osteoporosis, chronic steroid use) AND [2] limping  Answer Assessment - Initial Assessment Questions 1. MECHANISM: "How did the injury happen?" (e.g., twisting injury, direct blow)      She states she fell while she was bent down try to scoop a bug, she said one leg went one way and she went into almost like a split. She thinks she hurt her right knee while trying to get up.  2. ONSET: "When did the injury happen?" (Minutes or hours ago)      Monday.  3. LOCATION: "Where is the injury located?"      Right knee.  4. APPEARANCE of INJURY: "What does the injury look like?"      She states it was kinda swollen, she has applied ice and it has gone down.  5. SEVERITY: "Can you put weight on that leg?" "Can you walk?"      She states she can walk on it, has mostly been  using a walker but can bear weight.  6. SIZE: For cuts, bruises, or swelling, ask: "How large is it?" (e.g., inches or centimeters;  entire joint)      Denies any cuts or bruises, "swelling is down now that I put that ice on it".  7. PAIN: "Is there pain?" If Yes, ask: "How bad is the pain?"  "What does it keep you from doing?" (e.g., Scale 1-10; or mild, moderate, severe)   -  NONE: (0): no pain   -  MILD (1-3): doesn't interfere with normal activities    -  MODERATE (4-7): interferes with normal activities (e.g., work or school) or awakens from sleep, limping    -  SEVERE (8-10): excruciating pain, unable to do any normal activities, unable to walk     8/10.  8. TETANUS: For any breaks in the skin, ask: "When was the last tetanus booster?"     No breaks in skin.  9. OTHER SYMPTOMS: "Do you have any other symptoms?"  (e.g., "pop" when knee injured, swelling, locking, buckling)      Denies.  Protocols used: Knee Injury-A-AH

## 2023-03-04 ENCOUNTER — Ambulatory Visit (INDEPENDENT_AMBULATORY_CARE_PROVIDER_SITE_OTHER): Payer: Medicare Other

## 2023-03-04 ENCOUNTER — Ambulatory Visit (INDEPENDENT_AMBULATORY_CARE_PROVIDER_SITE_OTHER): Payer: Medicare Other | Admitting: Family Medicine

## 2023-03-04 ENCOUNTER — Encounter: Payer: Self-pay | Admitting: Family Medicine

## 2023-03-04 VITALS — BP 130/71 | HR 87 | Ht 65.0 in

## 2023-03-04 DIAGNOSIS — W19XXXA Unspecified fall, initial encounter: Secondary | ICD-10-CM | POA: Diagnosis not present

## 2023-03-04 DIAGNOSIS — M25461 Effusion, right knee: Secondary | ICD-10-CM | POA: Diagnosis not present

## 2023-03-04 DIAGNOSIS — M1711 Unilateral primary osteoarthritis, right knee: Secondary | ICD-10-CM | POA: Diagnosis not present

## 2023-03-04 DIAGNOSIS — M25561 Pain in right knee: Secondary | ICD-10-CM

## 2023-03-04 DIAGNOSIS — S76111A Strain of right quadriceps muscle, fascia and tendon, initial encounter: Secondary | ICD-10-CM | POA: Diagnosis not present

## 2023-03-04 DIAGNOSIS — Z471 Aftercare following joint replacement surgery: Secondary | ICD-10-CM | POA: Diagnosis not present

## 2023-03-04 NOTE — Progress Notes (Signed)
BP 130/71   Pulse 87   Ht 5\' 5"  (1.651 m)   SpO2 95%   BMI 35.61 kg/m    Subjective:   Patient ID: Anna Bradford, female    DOB: [birth year], 89 y.o.   MRN: [id number]  HPI: Anna Bradford is a 89 y.o. female presenting on 03/04/2023 for Knee Pain (right)   HPI Right knee pain, fall Patient says that about 10 days ago she was bending over for something in her house and when she came back up she slipped and her legs both went in opposite directions and she did the splits and ever since then she has been hurting on the right inside of her knee in the front of her knee where she fell on it.  She says she needs to use some ice and heat and alternating and that helped and she has used some ibuprofen and it helped to but is still been sore.  She says it does not give out on her and sometimes she can walk just fine but then sometimes on certain ways that she is walking she can feel a little bit more.  She denies any popping or catching or giving way.  When she stands on it without walking it does not hurt.  Relevant past medical, surgical, family and social history reviewed and updated as indicated. Interim medical history since our last visit reviewed. Allergies and medications reviewed and updated.  Review of Systems  Constitutional:  Negative for chills and fever.  Respiratory:  Negative for chest tightness and shortness of breath.   Cardiovascular:  Negative for chest pain and leg swelling.  Musculoskeletal:  Positive for arthralgias, gait problem and myalgias. Negative for back pain.  Skin:  Negative for rash.  Neurological:  Negative for dizziness, light-headedness and headaches.  Psychiatric/Behavioral:  Negative for agitation and behavioral problems.   All other systems reviewed and are negative.   Per HPI unless specifically indicated above   Allergies as of 03/04/2023       Reactions   Levofloxacin    Other (See Comments) tendonitis   Nitrofurantoin    Other reaction(s):  Other (See Comments) Bleeding thru bowels   Macrolides And Ketolides    Bleeding thru bowels   Codeine Nausea And Vomiting   Sulfa Antibiotics Nausea And Vomiting        Medication List        Accurate as of March 04, 2023  3:08 PM. If you have any questions, ask your nurse or doctor.          acetaminophen 325 MG tablet Commonly known as: TYLENOL Take 650 mg by mouth daily with lunch.   albuterol 108 (90 Base) MCG/ACT inhaler Commonly known as: VENTOLIN HFA Inhale 2 puffs into the lungs every 6 (six) hours as needed for wheezing or shortness of breath.   amitriptyline 50 MG tablet Commonly known as: ELAVIL Take 1 tablet (50 mg total) by mouth at bedtime.   amLODipine 10 MG tablet Commonly known as: NORVASC Take 1 tablet (10 mg total) by mouth daily.   baclofen 10 MG tablet Commonly known as: LIORESAL Take 1 tablet (10 mg total) by mouth at bedtime as needed for muscle spasms.   clobetasol cream 0.05 % Commonly known as: TEMOVATE Apply topically 2 (two) times daily.   diphenhydrAMINE 25 MG tablet Commonly known as: BENADRYL Take 25 mg by mouth daily as needed for allergies.   famotidine 20 MG tablet Commonly known as: Pepcid  Take 1 tablet (20 mg total) by mouth 2 (two) times daily.   losartan-hydrochlorothiazide 100-25 MG tablet Commonly known as: HYZAAR Take 1 tablet by mouth daily.   magnesium hydroxide 400 MG/5ML suspension Commonly known as: MILK OF MAGNESIA Take 15 mLs by mouth every other day.   methocarbamol 500 MG tablet Commonly known as: ROBAXIN Take 500 mg by mouth 4 (four) times daily.   multivitamin tablet Take 1 tablet by mouth daily.   nystatin cream Commonly known as: MYCOSTATIN Apply 1 application  topically daily as needed for dry skin.   Potassium 99 MG Tabs Take 99 mg by mouth daily.   rOPINIRole 2 MG tablet Commonly known as: REQUIP TAKE ONE TABLET AT BEDTIME FOR LEG CRAMPS   SYSTANE OP Place 1 drop into both eyes  daily as needed (dry eyes).   triamcinolone cream 0.1 % Commonly known as: KENALOG Apply 1 application topically 2 (two) times daily. What changed:  when to take this reasons to take this   vitamin C 1000 MG tablet Take 1,000 mg by mouth daily.   Vitamin D-3 25 MCG (1000 UT) Caps Take 2,000 Units by mouth daily.         Objective:   BP 130/71   Pulse 87   Ht 5\' 5"  (1.651 m)   SpO2 95%   BMI 35.61 kg/m   Wt Readings from Last 3 Encounters:  02/16/23 214 lb (97.1 kg)  01/21/23 221 lb (100.2 kg)  12/22/22 216 lb (98 kg)    Physical Exam Vitals and nursing note reviewed.  Constitutional:      Appearance: Normal appearance.  Musculoskeletal:        General: Tenderness present. No deformity. Normal range of motion.       Legs:  Neurological:     Mental Status: She is alert.       Assessment & Plan:   Problem List Items Addressed This Visit   None Visit Diagnoses       Strain of right quadriceps muscle, initial encounter    -  Primary     Fall, initial encounter       Relevant Orders   DG Knee 1-2 Views Right     Acute pain of right knee       Relevant Orders   DG Knee 1-2 Views Right     Knee x-ray: No signs of acute bony abnormality. Muscle sprain is most likely cause of pain, continue to alternate with ice and heat and use ibuprofen and gave a simple regimen of stretching and exercises.  If not improving then we can always send her to physical therapy in the future. Follow up plan: No follow-ups on file.  Counseling provided for all of the vaccine components Orders Placed This Encounter  Procedures   DG Knee 1-2 Views Right    Arville Care, MD [facility] 03/04/2023, 3:08 PM

## 2023-03-31 ENCOUNTER — Other Ambulatory Visit: Payer: Self-pay | Admitting: Family Medicine

## 2023-03-31 DIAGNOSIS — N3281 Overactive bladder: Secondary | ICD-10-CM

## 2023-04-12 ENCOUNTER — Encounter: Payer: Self-pay | Admitting: Nurse Practitioner

## 2023-04-12 ENCOUNTER — Ambulatory Visit: Payer: Self-pay | Admitting: Family Medicine

## 2023-04-12 ENCOUNTER — Ambulatory Visit (INDEPENDENT_AMBULATORY_CARE_PROVIDER_SITE_OTHER): Admitting: Nurse Practitioner

## 2023-04-12 VITALS — BP 128/61 | HR 92 | Temp 97.5°F | Ht 65.0 in | Wt 223.8 lb

## 2023-04-12 DIAGNOSIS — J208 Acute bronchitis due to other specified organisms: Secondary | ICD-10-CM | POA: Diagnosis not present

## 2023-04-12 DIAGNOSIS — R6889 Other general symptoms and signs: Secondary | ICD-10-CM

## 2023-04-12 DIAGNOSIS — B9689 Other specified bacterial agents as the cause of diseases classified elsewhere: Secondary | ICD-10-CM | POA: Diagnosis not present

## 2023-04-12 LAB — VERITOR FLU A/B WAIVED
Influenza A: NEGATIVE
Influenza B: NEGATIVE

## 2023-04-12 MED ORDER — DOXYCYCLINE HYCLATE 100 MG PO CAPS
100.0000 mg | ORAL_CAPSULE | Freq: Two times a day (BID) | ORAL | 0 refills | Status: DC
Start: 2023-04-12 — End: 2023-04-19

## 2023-04-12 MED ORDER — BENZONATATE 200 MG PO CAPS: 200.0000 mg | ORAL_CAPSULE | Freq: Two times a day (BID) | ORAL | 0 refills | Status: DC | PRN

## 2023-04-12 NOTE — Telephone Encounter (Signed)
 Productive cough since Sunday  Symptoms: Yellow mucous, mild SOB with movement, wheezing Pertinent Negatives: Patient denies fever, hemoptysis, fever, chest pain  Disposition: [x] Appointment(In office/)  Additional Notes: Pt scheduled for an appt today at PCP office with a different provider. This RN educated pt on home care, new-worsening symptoms, when to call back/seek emergent care. Pt verbalized understanding and agrees to plan.    Copied from CRM [id number]. Topic: Clinical - Red Word Triage >> Apr 12, 2023 10:42 AM Geroge Baseman wrote: Red Word that prompted transfer to Nurse Triage: Sickness started in her head and moving to her chest, onset Sunday. Yellow discolored mucous. Terrible productive cough. Reason for Disposition  [1] Fever > 100.0 F (37.8 C) AND [2] diabetes mellitus or weak immune system (e.g., HIV positive, cancer chemo, splenectomy, organ transplant, chronic steroids)  Answer Assessment - Initial Assessment Questions ONSET: "When did the cough begin?"      Sunday SPUTUM: "Describe the color of your sputum" (none, dry cough; clear, white, yellow, green)     Yellow HEMOPTYSIS: "Are you coughing up any blood?" If so ask: "How much?" (flecks, streaks, tablespoons, etc.)     Denies DIFFICULTY BREATHING: "Are you having difficulty breathing?" If Yes, ask: "How bad is it?" (e.g., mild, moderate, severe)    - MILD: No SOB at rest, mild SOB with walking, speaks normally in sentences, can lie down, no retractions, pulse < 100.    - MODERATE: SOB at rest, SOB with minimal exertion and prefers to sit, cannot lie down flat, speaks in phrases, mild retractions, audible wheezing, pulse 100-120.    - SEVERE: Very SOB at rest, speaks in single words, struggling to breathe, sitting hunched forward, retractions, pulse > 120      Mild FEVER: "Do you have a fever?" If Yes, ask: "What is your temperature, how was it measured, and when did it start?"     Denies OTHER SYMPTOMS: "Do you have  any other symptoms?" (e.g., runny nose, wheezing, chest pain)       Wheezing  Protocols used: Cough - Acute Productive-A-AH

## 2023-04-12 NOTE — Progress Notes (Signed)
 Acute Office Visit  Subjective:     Patient ID: Anna Bradford, female    DOB: 10-Aug-1933, 89 y.o.   MRN: [id number]  Chief Complaint  Patient presents with   Cough    Cough started yesterday started feeling bad Sunday    Sore Throat   Nasal Congestion    HPI Anna Bradford is a 89 y.o. female who complains of congestion, nasal blockage, productive cough, cough described as productive of yellow sputum, and headache for 4 days. She denies a history of chest pain, dizziness, and fatigue and denies a history of asthma. Patient denies smoke cigarettes.   POC Flu A & B order  Active Ambulatory Problems    Diagnosis Date Noted   Essential hypertension, benign 08/27/2011   GERD (gastroesophageal reflux disease) 08/27/2011   Varicose veins of bilateral lower extremities with other complications 03/19/2013   Mass of subcutaneous tissue of back 01/19/2018   Mass of soft tissue of abdomen 01/19/2018   Osteoporosis without current pathological fracture 10/01/2015   Severe obesity (BMI 35.0-39.9) with comorbidity (HCC) 12/13/2017   Osteoarthritis 09/11/2018   Lipoma of torso 09/09/2020   Submental adenopathy 10/30/2020   Resolved Ambulatory Problems    Diagnosis Date Noted   Precordial pain 08/27/2011   Past Medical History:  Diagnosis Date   Arthritis    Cancer (HCC) 1996   History of kidney stones     Review of Systems  Constitutional:  Negative for chills and fever.  HENT:  Positive for congestion.   Respiratory:  Positive for cough, sputum production and shortness of breath.        Yellow sputum  Cardiovascular:  Negative for chest pain and leg swelling.  Gastrointestinal:  Negative for constipation, nausea and vomiting.  Skin:  Negative for itching and rash.  Neurological:  Negative for dizziness and headaches.   Negative unless indicated in HPI    Objective:    BP 128/61   Pulse 92   Temp (!) 97.5 F (36.4 C) (Temporal)   Ht 5\' 5"  (1.651 m)   Wt 223 lb 12.8 oz  (101.5 kg)   SpO2 97%   BMI 37.24 kg/m  BP Readings from Last 3 Encounters:  04/12/23 128/61  03/04/23 130/71  02/16/23 (!) 151/79   Wt Readings from Last 3 Encounters:  04/12/23 223 lb 12.8 oz (101.5 kg)  02/16/23 214 lb (97.1 kg)  01/21/23 221 lb (100.2 kg)      Physical Exam Vitals and nursing note reviewed.  Constitutional:      General: She is not in acute distress.    Appearance: She is obese.  HENT:     Head: Normocephalic and atraumatic.     Nose: Congestion present.     Mouth/Throat:     Mouth: Mucous membranes are moist.  Eyes:     Extraocular Movements: Extraocular movements intact.     Conjunctiva/sclera: Conjunctivae normal.     Pupils: Pupils are equal, round, and reactive to light.  Cardiovascular:     Heart sounds: Normal heart sounds.  Pulmonary:     Effort: Pulmonary effort is normal.     Breath sounds: No wheezing.  Musculoskeletal:        General: Normal range of motion.     Cervical back: Normal range of motion. No rigidity or tenderness.     Right lower leg: No edema.     Left lower leg: No edema.  Skin:    General: Skin is warm and  dry.     Findings: No rash.  Neurological:     Mental Status: She is alert and oriented to person, place, and time.  Psychiatric:        Mood and Affect: Mood normal.        Behavior: Behavior normal.        Thought Content: Thought content normal.        Judgment: Judgment normal.     Results for orders placed or performed in visit on 04/12/23  Veritor Flu A/B Waived  Result Value Ref Range   Influenza A Negative Negative   Influenza B Negative Negative        Assessment & Plan:  Flu-like symptoms -     Veritor Flu A/B Waived  Acute bacterial bronchitis -     Doxycycline Hyclate; Take 1 capsule (100 mg total) by mouth 2 (two) times daily.  Dispense: 10 capsule; Refill: 0 -     Benzonatate; Take 1 capsule (200 mg total) by mouth 2 (two) times daily as needed for cough.  Dispense: 20 capsule; Refill:  0   Anna Bradford is a 89 yrs old Caucasian female seen fro cough and congestion Bacterial URI with cough: Doxy 100 mg BID # 10, Tessalon pearls 200 mg 1-tab TID PRN fo cough Symptomatic therapy suggested: push fluids, rest, and return office visit prn if symptoms persist or worsen. Lack of antibiotic effectiveness discussed with her.  Call or return to clinic prn if these symptoms worsen or fail to improve as anticipated.  The above assessment and management plan was discussed with the patient. The patient verbalized understanding of and has agreed to the management plan. Patient is aware to call the clinic if they develop any new symptoms or if symptoms persist or worsen.  Patient is aware when to return to the clinic for a follow-up visit. Patient educated on when it is appropriate to go to the emergency department.  Return if symptoms worsen or fail to improve.  Anna Bradford, Anna Bradford [facility] [address] [place], [place] [address] [phone]  Note: This document was prepared by [employer] voice dictation technology and any errors that results from this process are unintentional.

## 2023-04-19 ENCOUNTER — Ambulatory Visit: Payer: Self-pay

## 2023-04-19 ENCOUNTER — Encounter: Payer: Self-pay | Admitting: Family Medicine

## 2023-04-19 ENCOUNTER — Ambulatory Visit (INDEPENDENT_AMBULATORY_CARE_PROVIDER_SITE_OTHER): Admitting: Family Medicine

## 2023-04-19 VITALS — BP 146/70 | HR 100 | Temp 98.2°F | Ht 65.0 in | Wt 220.0 lb

## 2023-04-19 DIAGNOSIS — J0100 Acute maxillary sinusitis, unspecified: Secondary | ICD-10-CM | POA: Diagnosis not present

## 2023-04-19 DIAGNOSIS — R599 Enlarged lymph nodes, unspecified: Secondary | ICD-10-CM | POA: Diagnosis not present

## 2023-04-19 DIAGNOSIS — R051 Acute cough: Secondary | ICD-10-CM | POA: Diagnosis not present

## 2023-04-19 MED ORDER — AMOXICILLIN-POT CLAVULANATE 875-125 MG PO TABS
1.0000 | ORAL_TABLET | Freq: Two times a day (BID) | ORAL | 0 refills | Status: AC
Start: 2023-04-19 — End: 2023-04-26

## 2023-04-19 MED ORDER — PREDNISONE 20 MG PO TABS: 40.0000 mg | ORAL_TABLET | Freq: Every day | ORAL | 0 refills | Status: AC

## 2023-04-19 NOTE — Progress Notes (Addendum)
 Subjective:  Patient ID: Anna Bradford, female    DOB: 1933-02-05, 89 y.o.   MRN: [id number]  Patient Care Team: Dettinger, Elige Radon, MD as PCP - General (Family Medicine)   Chief Complaint:  Cough   HPI: Anna Bradford is a 89 y.o. female presenting on 04/19/2023 for Cough She was seen last week and treated with doxycycline and tessalon perles. States that they did not help her. Completed abx on Sunday, two days ago. She is using lozenges OTC. She is continuing to cough, have rhinorrhea. Denies fever, N/V/D. Endorses some ear/sinus pain. Denies headaches. Reports shortness of breath only with coughing.  States that she has this yearly, reports that prednisone typically works for her.  Reports that her right tonsillar lymph node hurts her and "catches" as she is turning her head. States that this has been going on for about one year. She has discussed this previously with her PCP and received imaging.   Relevant past medical, surgical, family, and social history reviewed and updated as indicated.  Allergies and medications reviewed and updated. Data reviewed: Chart in Epic.  Past Medical History:  Diagnosis Date   Arthritis    Cancer (HCC) 1996   left breast   Essential hypertension, benign    GERD (gastroesophageal reflux disease)    History of kidney stones     Past Surgical History:  Procedure Laterality Date   ABDOMINAL HYSTERECTOMY     Absess Off Intestine     cataract surgery Left 08/10/2011   cataract surgery Right 11/01/2011   CHOLECYSTECTOMY     1972   COLONOSCOPY N/A 02/06/2015   Procedure: COLONOSCOPY;  Surgeon: Najeeb U Rehman, MD;  Location: [facility];  Service: Endoscopy;  Laterality: N/A;  1015   FRACTURE SURGERY  1990   Right ankle ORIF   GALLBLADDER SURGERY     Rupture Lower Stomach   HERNIA REPAIR     Hysterectomy ---- unknown     1980   Lapoma Tumor     Left Breast Masectomy     19 86   MASS EXCISION N/A 01/30/2018   Procedure: EXCISION MASS 3CM  ON BACK AND 3CM ON ABDOMEN;  Surgeon: Lucretia Roers, MD;  Location: [facility];  Service: General;  Laterality: N/A;   Right Ankle Surgery     TOTAL KNEE ARTHROPLASTY Left 09/09/2021   Procedure: TOTAL KNEE ARTHROPLASTY;  Surgeon: Dannielle Huh, MD;  Location: [facility];  Service: Orthopedics;  Laterality: Left;    Social History   Socioeconomic History   Marital status: Widowed    Spouse name: Not on file   Number of children: 2   Years of education: 12-failed the 4th grade and had to repeat   Highest education level: 11th grade  Occupational History   Occupation: retired  Tobacco Use   Smoking status: Never   Smokeless tobacco: Never  Vaping Use   Vaping status: Never Used  Substance and Sexual Activity   Alcohol use: No   Drug use: No   Sexual activity: Not Currently    Birth control/protection: Surgical  Other Topics Concern   Not on file  Social History Narrative   Not on file   Social Drivers of Health   Financial Resource Strain: Low Risk  (12/22/2022)   Overall Financial Resource Strain (CARDIA)    Difficulty of Paying Living Expenses: Not hard at all  Food Insecurity: No Food Insecurity (12/22/2022)   Hunger Vital Sign    Worried  About Running Out of Food in the Last Year: Never true    Ran Out of Food in the Last Year: Never true  Transportation Needs: No Transportation Needs (12/22/2022)   PRAPARE - Administrator, Civil Service (Medical): No    Lack of Transportation (Non-Medical): No  Physical Activity: Inactive (12/22/2022)   Exercise Vital Sign    Days of Exercise per Week: 0 days    Minutes of Exercise per Session: 0 min  Stress: No Stress Concern Present (12/22/2022)   [employer] of Occupational Health - Occupational Stress Questionnaire    Feeling of Stress : Not at all  Social Connections: Moderately Isolated (12/22/2022)   Social Connection and Isolation Panel [NHANES]    Frequency of Communication with Friends and Family: More than  three times a week    Frequency of Social Gatherings with Friends and Family: Three times a week    Attends Religious Services: 1 to 4 times per year    Active Member of Clubs or Organizations: No    Attends Banker Meetings: Never    Marital Status: Widowed  Intimate Partner Violence: Not At Risk (12/22/2022)   Humiliation, Afraid, Rape, and Kick questionnaire    Fear of Current or Ex-Partner: No    Emotionally Abused: No    Physically Abused: No    Sexually Abused: No    Outpatient Encounter Medications as of 04/19/2023  Medication Sig   acetaminophen (TYLENOL) 325 MG tablet Take 650 mg by mouth daily with lunch.   albuterol (VENTOLIN HFA) 108 (90 Base) MCG/ACT inhaler Inhale 2 puffs into the lungs every 6 (six) hours as needed for wheezing or shortness of breath.   amitriptyline (ELAVIL) 50 MG tablet Take 1 tablet (50 mg total) by mouth at bedtime.   amLODipine (NORVASC) 10 MG tablet Take 1 tablet (10 mg total) by mouth daily.   Ascorbic Acid (VITAMIN C) 1000 MG tablet Take 1,000 mg by mouth daily.    baclofen (LIORESAL) 10 MG tablet Take 1 tablet (10 mg total) by mouth at bedtime as needed for muscle spasms.   benzonatate (TESSALON) 200 MG capsule Take 1 capsule (200 mg total) by mouth 2 (two) times daily as needed for cough.   Cholecalciferol (VITAMIN D-3) 1000 UNITS CAPS Take 2,000 Units by mouth daily.   clobetasol cream (TEMOVATE) 0.05 % Apply topically 2 (two) times daily.   diphenhydrAMINE (BENADRYL) 25 MG tablet Take 25 mg by mouth daily as needed for allergies.   doxycycline (VIBRAMYCIN) 100 MG capsule Take 1 capsule (100 mg total) by mouth 2 (two) times daily.   famotidine (PEPCID) 20 MG tablet Take 1 tablet (20 mg total) by mouth 2 (two) times daily.   losartan-hydrochlorothiazide (HYZAAR) 100-25 MG tablet Take 1 tablet by mouth daily.   magnesium hydroxide (MILK OF MAGNESIA) 400 MG/5ML suspension Take 15 mLs by mouth every other day.   methocarbamol (ROBAXIN)  500 MG tablet Take 500 mg by mouth 4 (four) times daily.   Multiple Vitamin (MULTIVITAMIN) tablet Take 1 tablet by mouth daily.   nystatin cream (MYCOSTATIN) Apply 1 application  topically daily as needed for dry skin.   Polyethyl Glycol-Propyl Glycol (SYSTANE OP) Place 1 drop into both eyes daily as needed (dry eyes).   Potassium 99 MG TABS Take 99 mg by mouth daily.   rOPINIRole (REQUIP) 2 MG tablet TAKE ONE TABLET AT BEDTIME FOR LEG CRAMPS   No facility-administered encounter medications on file as of 04/19/2023.  Allergies  Allergen Reactions   Levofloxacin     Other (See Comments) tendonitis   Nitrofurantoin     Other reaction(s): Other (See Comments) Bleeding thru bowels   Macrolides And Ketolides     Bleeding thru bowels   Codeine Nausea And Vomiting   Sulfa Antibiotics Nausea And Vomiting    Review of Systems As per HPI  Objective:  BP (!) 146/73   Pulse 100   Temp 98.2 F (36.8 C)   Ht 5\' 5"  (1.651 m)   Wt 220 lb (99.8 kg)   BMI 36.61 kg/m    Wt Readings from Last 3 Encounters:  04/19/23 220 lb (99.8 kg)  04/12/23 223 lb 12.8 oz (101.5 kg)  02/16/23 214 lb (97.1 kg)   Physical Exam Constitutional:      General: She is awake. She is not in acute distress.    Appearance: Normal appearance. She is well-developed and well-groomed. She is ill-appearing. She is not toxic-appearing or diaphoretic.  HENT:     Right Ear: A middle ear effusion is present. There is no impacted cerumen. No mastoid tenderness. No PE tube. No hemotympanum. Tympanic membrane is injected. Tympanic membrane is not scarred, perforated, erythematous or retracted.     Left Ear: There is impacted cerumen.     Nose: Congestion and rhinorrhea present. Rhinorrhea is clear.     Right Sinus: Maxillary sinus tenderness and frontal sinus tenderness present.     Left Sinus: Maxillary sinus tenderness and frontal sinus tenderness present.     Mouth/Throat:     Lips: Pink. No lesions.     Mouth: Mucous  membranes are moist.     Tongue: No lesions.     Pharynx: Posterior oropharyngeal erythema present. No pharyngeal swelling, oropharyngeal exudate, uvula swelling or postnasal drip.     Tonsils: No tonsillar exudate or tonsillar abscesses. 2+ on the right. 2+ on the left.  Eyes:     Conjunctiva/sclera:     Right eye: Right conjunctiva is injected.     Left eye: Left conjunctiva is injected.  Cardiovascular:     Rate and Rhythm: Normal rate and regular rhythm.     Heart sounds: Normal heart sounds.  Pulmonary:     Effort: Pulmonary effort is normal.     Breath sounds: Transmitted upper airway sounds present. No stridor or decreased air movement. No decreased breath sounds.     Comments: Barking cough  Lymphadenopathy:     Head:     Right side of head: Tonsillar adenopathy present. No submental, submandibular, preauricular or posterior auricular adenopathy.     Left side of head: No submental, submandibular, tonsillar, preauricular or posterior auricular adenopathy.     Cervical:     Right cervical: No superficial or deep cervical adenopathy.    Left cervical: No superficial or deep cervical adenopathy.  Skin:    General: Skin is warm.     Capillary Refill: Capillary refill takes less than 2 seconds.  Neurological:     General: No focal deficit present.     Mental Status: She is alert, oriented to person, place, and time and easily aroused. Mental status is at baseline.  Psychiatric:        Attention and Perception: Attention and perception normal.        Mood and Affect: Mood and affect normal.        Speech: Speech normal.        Behavior: Behavior normal. Behavior is cooperative.  Thought Content: Thought content normal.        Cognition and Memory: Cognition and memory normal.     Results for orders placed or performed in visit on 04/12/23  Veritor Flu A/B Waived   Collection Time: 04/12/23  3:41 PM  Result Value Ref Range   Influenza A Negative Negative   Influenza  B Negative Negative       03/04/2023    3:01 PM 02/16/2023    3:17 PM 01/21/2023    3:29 PM 12/22/2022    4:01 PM 10/14/2022    9:56 AM  Depression screen PHQ 2/9  Decreased Interest 0 0 0 0   Down, Depressed, Hopeless 0 0 0 0   PHQ - 2 Score 0 0 0 0   Altered sleeping     0  Tired, decreased energy     0  Change in appetite     0  Feeling bad or failure about yourself      0  Trouble concentrating     0  Moving slowly or fidgety/restless     0  Suicidal thoughts     0  Difficult doing work/chores     Not difficult at all       10/14/2022    9:55 AM 07/23/2022    2:59 PM 10/29/2021    1:39 PM 06/18/2021    4:35 PM  GAD 7 : Generalized Anxiety Score  Nervous, Anxious, on Edge 0 0 0 0  Control/stop worrying 0 0 0 0  Worry too much - different things 0 0 0 0  Trouble relaxing 0 0 0 0  Restless 0 0 0 0  Easily annoyed or irritable 0 0 0 0  Afraid - awful might happen 0 0 0 0  Total GAD 7 Score 0 0 0 0  Anxiety Difficulty Not difficult at all Not difficult at all Not difficult at all    Pertinent labs & imaging results that were available during my care of the patient were reviewed by me and considered in my medical decision making.  Assessment & Plan:  Ozzie was seen today for cough.  Diagnoses and all orders for this visit:  Acute non-recurrent maxillary sinusitis Will start medications as below. Discussed continuing at home treatment.  -     amoxicillin-clavulanate (AUGMENTIN) 875-125 MG tablet; Take 1 tablet by mouth 2 (two) times daily for 7 days. -     predniSONE (DELTASONE) 20 MG tablet; Take 2 tablets (40 mg total) by mouth daily with breakfast for 5 days.  Acute cough As above.  -     amoxicillin-clavulanate (AUGMENTIN) 875-125 MG tablet; Take 1 tablet by mouth 2 (two) times daily for 7 days. -     predniSONE (DELTASONE) 20 MG tablet; Take 2 tablets (40 mg total) by mouth daily with breakfast for 5 days.  Enlarged lymph node Recommend that patient follow up with  PCP after completing course above if symptoms remain. Reviewed imaging as below.   CT Soft Tissue Neck W Contrast (Order #[id number]) on 03/12/2021 - Order Result History Report   Continue all other maintenance medications.  Follow up plan: Return if symptoms worsen or fail to improve.   Continue healthy lifestyle choices, including diet (rich in fruits, vegetables, and lean proteins, and low in salt and simple carbohydrates) and exercise (at least 30 minutes of moderate physical activity daily).  Written and verbal instructions provided   The above assessment and management plan was discussed with the  patient. The patient verbalized understanding of and has agreed to the management plan. Patient is aware to call the clinic if they develop any new symptoms or if symptoms persist or worsen. Patient is aware when to return to the clinic for a follow-up visit. Patient educated on when it is appropriate to go to the emergency department.   Neale Burly, DNP-FNP [facility] [address] [place], [place] [address] [phone]

## 2023-04-19 NOTE — Telephone Encounter (Signed)
 Copied from CRM [id number]. Topic: Clinical - Red Word Triage >> Apr 19, 2023  8:18 AM Abundio Miu S wrote: [name] that prompted transfer to Nurse Triage: Increased cough,   Chief Complaint: Cough Symptoms: productive cough with white phlegm Frequency: since 04/10/2023 (over a week now) Pertinent Negatives: Patient denies fever, shortness of breath, nausea, vomiting, diarrhea, chest pain, back pain Disposition: [] ED /[] Urgent Care (no appt availability in office) / [x] Appointment(In office/virtual)/ []  [facility]/ [] Home Care/ [] Refused Recommended Disposition /[] [facility]/ []  Follow-up with PCP Additional Notes: Patient called and advised that she was diagnosed with bronchitis. She was given an antibiotic and tessalon pearls.  Patient states that she has gotten prednisone in the past that usually helps her. Patient states that she was coughing up yellow phlegm and now it is clear but patient has continuous coughing episodes during triage over the phone with this RN.  She denies any shortness of breath except only during her coughing episodes. Patient also denies any fever, nausea, vomiting, diarrhea, coughing up blood, chest pain, or back pain.  Appointment is made for patient for today 04/19/2023 at 9:50 am with Arrie Senate at patient's PCP office.  Patient is given Care Advice as per protocol and also advised that if anything gets worse to go to the Emergency Room or call 911 if needed.  Patient verbalized understanding.  Reason for Disposition  [1] Continuous (nonstop) coughing interferes with work or school AND [2] no improvement using cough treatment per Care Advice  Answer Assessment - Initial Assessment Questions 1. ONSET: "When did the cough begin?"      04/10/2023 and went to pcp office two days later 2. SEVERITY: "How bad is the cough today?"      Continuous coughing episodes 3. SPUTUM: "Describe the color of your sputum" (none, dry cough; clear, white,  yellow, green)     Was yellow--now it's white 4. HEMOPTYSIS: "Are you coughing up any blood?" If so ask: "How much?" (flecks, streaks, tablespoons, etc.)     No 5. DIFFICULTY BREATHING: "Are you having difficulty breathing?" If Yes, ask: "How bad is it?" (e.g., mild, moderate, severe)    - MILD: No SOB at rest, mild SOB with walking, speaks normally in sentences, can lie down, no retractions, pulse < 100.    - MODERATE: SOB at rest, SOB with minimal exertion and prefers to sit, cannot lie down flat, speaks in phrases, mild retractions, audible wheezing, pulse 100-120.    - SEVERE: Very SOB at rest, speaks in single words, struggling to breathe, sitting hunched forward, retractions, pulse > 120      Only when coughing 6. FEVER: "Do you have a fever?" If Yes, ask: "What is your temperature, how was it measured, and when did it start?"     no 7. CARDIAC HISTORY: "Do you have any history of heart disease?" (e.g., heart attack, congestive heart failure)      No 8. LUNG HISTORY: "Do you have any history of lung disease?"  (e.g., pulmonary embolus, asthma, emphysema)     No 9. PE RISK FACTORS: "Do you have a history of blood clots?" (or: recent major surgery, recent prolonged travel, bedridden)     No 10. OTHER SYMPTOMS: "Do you have any other symptoms?" (e.g., runny nose, wheezing, chest pain)       Generalized malaise, runny nose 12. TRAVEL: "Have you traveled out of the country in the last month?" (e.g., travel history, exposures)  No  Protocols used: Cough - Acute Productive-A-AH

## 2023-04-19 NOTE — Patient Instructions (Addendum)
 It appears that you have a upper respiratory infection (cold).  Cold symptoms can last up to 2 weeks.  I recommend that you only use cold medications that are safe in high blood pressure like Coricidin (generic is fine).  Other cold medications can increase your blood pressure.    - Get plenty of rest and drink plenty of fluids. - Try to breathe moist air. Use a cold mist humidifier. - Consume warm fluids (soup or tea) to provide relief for a stuffy nose and to loosen phlegm. - For nasal stuffiness, try saline nasal spray or a Neti Pot.  Afrin nasal spray can also be used but this product should not be used longer than 3 days or it will cause rebound nasal stuffiness (worsening nasal congestion). - For sore throat pain relief: use chloraseptic spray, suck on throat lozenges, hard candy or popsicles; gargle with warm salt water (1/4 tsp. salt per 8 oz. of water); and eat soft, bland foods. - Eat a well-balanced diet. If you cannot, ensure you are getting enough nutrients by taking a daily multivitamin. - Avoid dairy products, as they can thicken phlegm. - Avoid alcohol, as it impairs your body's immune system.  CONTACT YOUR DOCTOR IF YOU EXPERIENCE ANY OF THE FOLLOWING: - High fever - Ear pain - Sinus-type headache - Unusually severe cold symptoms - Cough that gets worse while other cold symptoms improve - Flare up of any chronic lung problem, such as asthma - Your symptoms persist longer than 2 weeks

## 2023-04-27 ENCOUNTER — Other Ambulatory Visit (INDEPENDENT_AMBULATORY_CARE_PROVIDER_SITE_OTHER)

## 2023-04-27 ENCOUNTER — Ambulatory Visit: Admitting: Orthopaedic Surgery

## 2023-04-27 ENCOUNTER — Encounter: Payer: Self-pay | Admitting: Orthopaedic Surgery

## 2023-04-27 DIAGNOSIS — M545 Low back pain, unspecified: Secondary | ICD-10-CM

## 2023-04-27 NOTE — Progress Notes (Addendum)
 My back is worse.  She has had more pain in the lower back.  She had MRI in December which I have re-reviewed.  She has been offered an epidural which she refused but has reconsidered.  She has pain standing over 15 to 20 minutes.  She has no pain while sitting or sleeping.  She is tired of hurting.  She has no weakness, no numbness.  Lower back is tender in mid lower area but no spasm, ROM is tender and decreased, NV intact, gait is slow but steady, muscle tone and strength are normal.  X-rays were done of the lumbar spine, reported separately.  Encounter Diagnosis  Name Primary?   Lumbar pain Yes   I will set her up for epidural with Dr. Alvester Morin.  She is agreeable to this.  She has pain medicine.  Return in one month.  Call if any problem.  Precautions discussed.  Electronically Signed Darreld Mclean, MD 4/9/20253:00 PM

## 2023-04-27 NOTE — Patient Instructions (Signed)
 To see Dr. Alvester Morin

## 2023-05-02 ENCOUNTER — Ambulatory Visit: Payer: Self-pay

## 2023-05-02 NOTE — Telephone Encounter (Signed)
 [email or web address]

## 2023-05-02 NOTE — Telephone Encounter (Signed)
 This RN attempted return call #2. No answer. LVM.

## 2023-05-02 NOTE — Telephone Encounter (Signed)
 This RN made the 3rd attempt to contact the patient. Patient did not answer. RN LVM with a callback number. Will route to the office for follow-up. Reason for Disposition . Third attempt to contact caller AND no contact made. Phone number verified.  Protocols used: No Contact or Duplicate Contact Call-A-AH

## 2023-05-02 NOTE — Telephone Encounter (Signed)
 Copied from CRM [id number]. Topic: Clinical - Medication Question >> May 02, 2023 11:56 AM Jenice Mitts wrote: Reason for CRM: Patient states she is still sick after finishing all of her medication she was prescribed. Patient would like to know if there is something else she can take or would she be prescribed the same medication

## 2023-05-02 NOTE — Telephone Encounter (Signed)
 This RN attempted return phone call attempt #1. No answer. LVM.

## 2023-05-03 NOTE — Telephone Encounter (Signed)
 Chief Complaint: Sinusitis   Symptoms: Cough and congestion, yellow sputum  Frequency: Comes and goes  Pertinent Negatives: Patient denies fever, chills, SOB, pain  Disposition: [] ED /[] Urgent Care (no appt availability in office) / [] Appointment(In office/virtual)/ []  [facility]/ [] Home Care/ [] Refused Recommended Disposition /[] [facility]/ [x]  Follow-up with PCP Additional Notes: Patient has been seen times 2 for symptoms. Patient first treated with antibiotics on 04/12/23 for flu like symptoms Doxycycline and Benzonatate was prescribed. Symptoms did not improve and patient was seen again on 04/19/23 and was treated for acute non-recurrent maxillary sinusitis with Augmentin and prednisone. Patient states her symptoms have slightly improved after completing the course of antibiotics and steroid but she is still cough, congested and coughing up yellow sputum. Patient wants to know what else she should be doing. Care advice given and patient's PCP is booked until May 2025. Forwarding triage not to PCP for additional advice.   Copied from CRM [id number]. Topic: Clinical - Medication Question >> May 02, 2023 11:56 AM Jenice Mitts wrote: Reason for CRM: Patient states she is still sick after finishing all of her medication she was prescribed. Patient would like to know if there is something else she can take or would she be prescribed the same medication Reason for Disposition  [1] Finished taking antibiotics AND [2] symptoms are BETTER but [3] not completely gone  Answer Assessment - Initial Assessment Questions 1. INFECTION: "What infection is the antibiotic being given for?"     Bronchitis  2. ANTIBIOTIC: "What antibiotic are you taking" "How many times per day?"     04/19/23 Augmentin BID  3. DURATION: "When was the antibiotic started?"     Same day (completed course) 4. MAIN CONCERN OR SYMPTOM:  "What is your main concern right now?"     Congestion and cough  5.  BETTER-SAME-WORSE: "Are you getting better, staying the same, or getting worse compared to when you first started the antibiotics?" If getting worse, ask: "In what way?"      A little better  6. FEVER: "Do you have a fever?" If Yes, ask: "What is your temperature, how was it measured, and when did it start?"     No  7. SYMPTOMS: "Are there any other symptoms you're concerned about?" If Yes, ask: "When did it start?"     Cough, congestion, light yellow sputum  8. FOLLOW-UP APPOINTMENT: "Do you have a follow-up appointment with your doctor?"     No  Protocols used: Infection on Antibiotic Follow-up Call-A-AH

## 2023-05-03 NOTE — Telephone Encounter (Signed)
 Patient of Dettinger

## 2023-05-03 NOTE — Telephone Encounter (Signed)
Pt scheduled    LS

## 2023-05-03 NOTE — Telephone Encounter (Signed)
 She needs to see DOD tomorrow.

## 2023-05-04 ENCOUNTER — Encounter: Payer: Self-pay | Admitting: Family Medicine

## 2023-05-04 ENCOUNTER — Ambulatory Visit: Admitting: Family Medicine

## 2023-05-04 ENCOUNTER — Ambulatory Visit: Admitting: Orthopaedic Surgery

## 2023-05-04 VITALS — BP 129/63 | HR 91 | Temp 97.9°F | Ht 65.0 in | Wt 222.4 lb

## 2023-05-04 DIAGNOSIS — R590 Localized enlarged lymph nodes: Secondary | ICD-10-CM | POA: Diagnosis not present

## 2023-05-04 DIAGNOSIS — R062 Wheezing: Secondary | ICD-10-CM

## 2023-05-04 DIAGNOSIS — R0989 Other specified symptoms and signs involving the circulatory and respiratory systems: Secondary | ICD-10-CM

## 2023-05-04 DIAGNOSIS — R059 Cough, unspecified: Secondary | ICD-10-CM | POA: Diagnosis not present

## 2023-05-04 MED ORDER — CEFTRIAXONE SODIUM 1 G IJ SOLR
1.0000 g | Freq: Once | INTRAMUSCULAR | Status: AC
  Administered 2023-05-04: 1 g via INTRAMUSCULAR

## 2023-05-04 MED ORDER — GUAIFENESIN ER 600 MG PO TB12: 600.0000 mg | ORAL_TABLET | Freq: Two times a day (BID) | ORAL | 0 refills | Status: AC

## 2023-05-04 MED ORDER — PREDNISONE 20 MG PO TABS: 40.0000 mg | ORAL_TABLET | Freq: Every day | ORAL | 0 refills | Status: AC

## 2023-05-04 MED ORDER — DOXYCYCLINE HYCLATE 100 MG PO TABS: 100.0000 mg | ORAL_TABLET | Freq: Two times a day (BID) | ORAL | 0 refills | Status: AC

## 2023-05-04 NOTE — Addendum Note (Signed)
 Addended by: Wynelle Heather on: 05/04/2023 02:46 PM   Modules accepted: Orders

## 2023-05-04 NOTE — Progress Notes (Signed)
 Subjective:  Patient ID: Anna Bradford, female    DOB: 04/07/1933, 89 y.o.   MRN: [id number]  Patient Care Team: Dettinger, Elige Radon, MD as PCP - General (Family Medicine)   Chief Complaint:  Cough, Nasal Congestion, and Wheezing (Patient has been seen twice for this.  States she started to get better on the last visit on 4/1 but got worse when she went in the rain for a doctors appointment. States it was a week ago when she went in the rain. )   HPI: Anna Bradford is a 89 y.o. female presenting on 05/04/2023 for Cough, Nasal Congestion, and Wheezing (Patient has been seen twice for this.  States she started to get better on the last visit on 4/1 but got worse when she went in the rain for a doctors appointment. States it was a week ago when she went in the rain. )   Discussed the use of AI scribe software for clinical note transcription with the patient, who gave verbal consent to proceed.  History of Present Illness   Anna Bradford is an 89 year old female who presents with persistent cough and chest congestion.  Her symptoms began three weeks ago with bronchitis. She has been seen weekly since then, initially receiving tessalon and doxycycline, which she completed. She was later diagnosed with a sinus infection and completed a course of Augmentin. Despite these treatments, she continues to experience symptoms.  She describes her current symptoms as cough and chest congestion, with the congestion localized in her chest. She has not been taking Mucinex recently as she ran out. She uses albuterol two to three times a day and continues to take Tessalon for her cough, which provides some relief. She also uses honey and lemon juice as a home remedy.  No fever throughout the illness but she reports some wheezing, especially with exertion, such as walking to the mailbox. No chest pain, leg swelling, or changes in urinary output. She notes that her sputum was yellow but has recently turned  darker.  She mentions a swollen lymph node in her neck that has been present for about a year, which was previously evaluated with an X-ray a year or two ago. She experiences discomfort when turning her head, which has become more frequent.          Relevant past medical, surgical, family, and social history reviewed and updated as indicated.  Allergies and medications reviewed and updated. Data reviewed: Chart in Epic.   Past Medical History:  Diagnosis Date   Arthritis    Cancer [facility]) 1996   left breast   Essential hypertension, benign    GERD (gastroesophageal reflux disease)    History of kidney stones     Past Surgical History:  Procedure Laterality Date   ABDOMINAL HYSTERECTOMY     Absess Off Intestine     cataract surgery Left 08/10/2011   cataract surgery Right 11/01/2011   CHOLECYSTECTOMY     1972   COLONOSCOPY N/A 02/06/2015   Procedure: COLONOSCOPY;  Surgeon: Malissa Hippo, MD;  Location: [facility] ENDO SUITE;  Service: Endoscopy;  Laterality: N/A;  1015   FRACTURE SURGERY  1990   Right ankle ORIF   GALLBLADDER SURGERY     Rupture Lower Stomach   HERNIA REPAIR     Hysterectomy ---- unknown     1980   Lapoma Tumor     Left Breast Masectomy     1986   MASS EXCISION N/A 01/30/2018  Procedure: EXCISION MASS 3CM ON BACK AND 3CM ON ABDOMEN;  Surgeon: Awilda Bogus, MD;  Location: [facility] ORS;  Service: General;  Laterality: N/A;   Right Ankle Surgery     TOTAL KNEE ARTHROPLASTY Left 09/09/2021   Procedure: TOTAL KNEE ARTHROPLASTY;  Surgeon: Christie Cox, MD;  Location: [facility] ORS;  Service: Orthopedics;  Laterality: Left;    Social History   Socioeconomic History   Marital status: Widowed    Spouse name: Not on file   Number of children: 2   Years of education: 12-failed the 4th grade and had to repeat   Highest education level: 11th grade  Occupational History   Occupation: retired  Tobacco Use   Smoking status: Never   Smokeless tobacco: Never  Vaping Use    Vaping status: Never Used  Substance and Sexual Activity   Alcohol use: No   Drug use: No   Sexual activity: Not Currently    Birth control/protection: Surgical  Other Topics Concern   Not on file  Social History Narrative   Not on file   Social Drivers of Health   Financial Resource Strain: Low Risk  (12/22/2022)   Overall Financial Resource Strain (CARDIA)    Difficulty of Paying Living Expenses: Not hard at all  Food Insecurity: No Food Insecurity (12/22/2022)   Hunger Vital Sign    Worried About Running Out of Food in the Last Year: Never true    Ran Out of Food in the Last Year: Never true  Transportation Needs: No Transportation Needs (12/22/2022)   PRAPARE - Administrator, Civil Service (Medical): No    Lack of Transportation (Non-Medical): No  Physical Activity: Inactive (12/22/2022)   Exercise Vital Sign    Days of Exercise per Week: 0 days    Minutes of Exercise per Session: 0 min  Stress: No Stress Concern Present (12/22/2022)   [employer] of Occupational Health - Occupational Stress Questionnaire    Feeling of Stress : Not at all  Social Connections: Moderately Isolated (12/22/2022)   Social Connection and Isolation Panel [NHANES]    Frequency of Communication with Friends and Family: More than three times a week    Frequency of Social Gatherings with Friends and Family: Three times a week    Attends Religious Services: 1 to 4 times per year    Active Member of Clubs or Organizations: No    Attends Banker Meetings: Never    Marital Status: Widowed  Intimate Partner Violence: Not At Risk (12/22/2022)   Humiliation, Afraid, Rape, and Kick questionnaire    Fear of Current or Ex-Partner: No    Emotionally Abused: No    Physically Abused: No    Sexually Abused: No    Outpatient Encounter Medications as of 05/04/2023  Medication Sig   acetaminophen (TYLENOL) 325 MG tablet Take 650 mg by mouth daily with lunch.   albuterol (VENTOLIN  HFA) 108 (90 Base) MCG/ACT inhaler Inhale 2 puffs into the lungs every 6 (six) hours as needed for wheezing or shortness of breath.   amitriptyline (ELAVIL) 50 MG tablet Take 1 tablet (50 mg total) by mouth at bedtime.   amLODipine (NORVASC) 10 MG tablet Take 1 tablet (10 mg total) by mouth daily.   Ascorbic Acid (VITAMIN C) 1000 MG tablet Take 1,000 mg by mouth daily.    baclofen (LIORESAL) 10 MG tablet Take 1 tablet (10 mg total) by mouth at bedtime as needed for muscle spasms.   benzonatate (TESSALON)  200 MG capsule Take 1 capsule (200 mg total) by mouth 2 (two) times daily as needed for cough.   Cholecalciferol (VITAMIN D-3) 1000 UNITS CAPS Take 2,000 Units by mouth daily.   clobetasol cream (TEMOVATE) 0.05 % Apply topically 2 (two) times daily.   diphenhydrAMINE (BENADRYL) 25 MG tablet Take 25 mg by mouth daily as needed for allergies.   doxycycline (VIBRA-TABS) 100 MG tablet Take 1 tablet (100 mg total) by mouth 2 (two) times daily for 10 days. 1 po bid   famotidine (PEPCID) 20 MG tablet Take 1 tablet (20 mg total) by mouth 2 (two) times daily.   guaiFENesin (MUCINEX) 600 MG 12 hr tablet Take 1 tablet (600 mg total) by mouth 2 (two) times daily for 7 days.   losartan-hydrochlorothiazide (HYZAAR) 100-25 MG tablet Take 1 tablet by mouth daily.   magnesium hydroxide (MILK OF MAGNESIA) 400 MG/5ML suspension Take 15 mLs by mouth every other day.   methocarbamol (ROBAXIN) 500 MG tablet Take 500 mg by mouth 4 (four) times daily.   Multiple Vitamin (MULTIVITAMIN) tablet Take 1 tablet by mouth daily.   nystatin cream (MYCOSTATIN) Apply 1 application  topically daily as needed for dry skin.   Polyethyl Glycol-Propyl Glycol (SYSTANE OP) Place 1 drop into both eyes daily as needed (dry eyes).   Potassium 99 MG TABS Take 99 mg by mouth daily.   predniSONE (DELTASONE) 20 MG tablet Take 2 tablets (40 mg total) by mouth daily with breakfast for 5 days.   rOPINIRole (REQUIP) 2 MG tablet TAKE ONE TABLET AT  BEDTIME FOR LEG CRAMPS   No facility-administered encounter medications on file as of 05/04/2023.    Allergies  Allergen Reactions   Levofloxacin     Other (See Comments) tendonitis   Nitrofurantoin     Other reaction(s): Other (See Comments) Bleeding thru bowels   Macrolides And Ketolides     Bleeding thru bowels   Codeine Nausea And Vomiting   Sulfa Antibiotics Nausea And Vomiting    Pertinent ROS per HPI, otherwise unremarkable      Objective:  BP 129/63   Pulse 91   Temp 97.9 F (36.6 C)   Ht 5\' 5"  (1.651 m)   Wt 222 lb 6.4 oz (100.9 kg)   SpO2 94%   BMI 37.01 kg/m    Wt Readings from Last 3 Encounters:  05/04/23 222 lb 6.4 oz (100.9 kg)  04/19/23 220 lb (99.8 kg)  04/12/23 223 lb 12.8 oz (101.5 kg)    Physical Exam Vitals and nursing note reviewed.  Constitutional:      General: She is not in acute distress.    Appearance: Normal appearance. She is obese. She is not ill-appearing, toxic-appearing or diaphoretic.  HENT:     Head: Normocephalic and atraumatic.     Right Ear: Tympanic membrane, ear canal and external ear normal.     Left Ear: Tympanic membrane, ear canal and external ear normal.     Nose: Nose normal.     Mouth/Throat:     Mouth: Mucous membranes are moist.     Pharynx: Oropharynx is clear.  Eyes:     Conjunctiva/sclera: Conjunctivae normal.     Pupils: Pupils are equal, round, and reactive to light.  Neck:   Cardiovascular:     Rate and Rhythm: Normal rate and regular rhythm.     Heart sounds: Normal heart sounds.  Pulmonary:     Effort: Pulmonary effort is normal.     Breath sounds: Wheezing  and rhonchi present.  Musculoskeletal:     Cervical back: Neck supple.     Right lower leg: No edema.     Left lower leg: No edema.  Lymphadenopathy:     Head:     Right side of head: Submental adenopathy present.     Cervical: Cervical adenopathy present.  Skin:    General: Skin is warm and dry.     Capillary Refill: Capillary refill  takes less than 2 seconds.  Neurological:     General: No focal deficit present.     Mental Status: She is alert and oriented to person, place, and time.  Psychiatric:        Mood and Affect: Mood normal.        Behavior: Behavior normal.        Thought Content: Thought content normal.        Judgment: Judgment normal.      Results for orders placed or performed in visit on 04/12/23  Veritor Flu A/B Waived   Collection Time: 04/12/23  3:41 PM  Result Value Ref Range   Influenza A Negative Negative   Influenza B Negative Negative       Pertinent labs & imaging results that were available during my care of the patient were reviewed by me and considered in my medical decision making.  Assessment & Plan:  Massiel was seen today for cough, nasal congestion and wheezing.  Diagnoses and all orders for this visit:  Chest congestion -     predniSONE (DELTASONE) 20 MG tablet; Take 2 tablets (40 mg total) by mouth daily with breakfast for 5 days. -     doxycycline (VIBRA-TABS) 100 MG tablet; Take 1 tablet (100 mg total) by mouth 2 (two) times daily for 10 days. 1 po bid -     guaiFENesin (MUCINEX) 600 MG 12 hr tablet; Take 1 tablet (600 mg total) by mouth 2 (two) times daily for 7 days.  Cough in adult -     predniSONE (DELTASONE) 20 MG tablet; Take 2 tablets (40 mg total) by mouth daily with breakfast for 5 days. -     doxycycline (VIBRA-TABS) 100 MG tablet; Take 1 tablet (100 mg total) by mouth 2 (two) times daily for 10 days. 1 po bid -     guaiFENesin (MUCINEX) 600 MG 12 hr tablet; Take 1 tablet (600 mg total) by mouth 2 (two) times daily for 7 days.  Wheezing -     predniSONE (DELTASONE) 20 MG tablet; Take 2 tablets (40 mg total) by mouth daily with breakfast for 5 days. -     doxycycline (VIBRA-TABS) 100 MG tablet; Take 1 tablet (100 mg total) by mouth 2 (two) times daily for 10 days. 1 po bid -     guaiFENesin (MUCINEX) 600 MG 12 hr tablet; Take 1 tablet (600 mg total) by mouth 2  (two) times daily for 7 days.  Submental adenopathy -     US Soft Tissue Head/Neck (NON-THYROID); Future     Assessment and Plan    Pneumonia Suspected pneumonia in the left lower lobe, indicated by rhonchi and wheezing. She has had bronchitis and sinus infection, with persistent cough and chest congestion. No fever reported, but exertional wheezing present. Due to an allergy to macrolides, alternative antibiotics are considered. A Rocephin injection and doxycycline are chosen for broader coverage. - Administer Rocephin (cephalosporin) injection before she leaves - Prescribe doxycycline for 10 days - Prescribe prednisone - Continue albuterol every 6-8  hours as needed for cough and wheezing - Continue Tessalon as needed for cough - Prescribe Mucinex twice a day with lots of water  Swollen lymph node Swollen lymph node in the neck, present for approximately a year. Previous X-ray done a year or two ago. She reports discomfort when turning her head. An ultrasound is needed to assess for any changes in size or characteristics. - Order ultrasound of the neck to assess lymph node size and changes  Macrolide allergy She reports a previous allergic reaction to macrolides, causing significant illness. Alternative antibiotics are selected to avoid adverse reactions. - Avoid prescribing macrolides          Continue all other maintenance medications.  Follow up plan: Return in about 2 weeks (around 05/18/2023), or if symptoms worsen or fail to improve, for URI cough.   Continue healthy lifestyle choices, including diet (rich in fruits, vegetables, and lean proteins, and low in salt and simple carbohydrates) and exercise (at least 30 minutes of moderate physical activity daily).  Educational handout given for CAP  The above assessment and management plan was discussed with the patient. The patient verbalized understanding of and has agreed to the management plan. Patient is aware to call  the clinic if they develop any new symptoms or if symptoms persist or worsen. Patient is aware when to return to the clinic for a follow-up visit. Patient educated on when it is appropriate to go to the emergency department.   Kattie Parrot, FNP-C [facility] [phone]

## 2023-05-12 ENCOUNTER — Telehealth: Payer: Self-pay

## 2023-05-12 NOTE — Telephone Encounter (Signed)
 Pt aware ultrasound scheduled at [facility] 05/19/23 at 1:30.

## 2023-05-12 NOTE — Telephone Encounter (Signed)
 Copied from CRM [id number]. Topic: Clinical - Lab/Test Results >> May 12, 2023  9:49 AM Hobson Luna F wrote: Reason for CRM: Patient is calling in returning a call from the office regarding her Imaging Results.

## 2023-05-19 ENCOUNTER — Ambulatory Visit (HOSPITAL_COMMUNITY): Attending: Family Medicine

## 2023-05-20 ENCOUNTER — Ambulatory Visit (INDEPENDENT_AMBULATORY_CARE_PROVIDER_SITE_OTHER): Admitting: Family Medicine

## 2023-05-20 ENCOUNTER — Encounter: Payer: Self-pay | Admitting: Family Medicine

## 2023-05-20 VITALS — BP 123/66 | HR 101 | Ht 65.0 in | Wt 224.0 lb

## 2023-05-20 DIAGNOSIS — J683 Other acute and subacute respiratory conditions due to chemicals, gases, fumes and vapors: Secondary | ICD-10-CM | POA: Diagnosis not present

## 2023-05-20 MED ORDER — TRELEGY ELLIPTA 100-62.5-25 MCG/ACT IN AEPB: 1.0000 | INHALATION_SPRAY | Freq: Every day | RESPIRATORY_TRACT | Status: AC

## 2023-05-20 NOTE — Addendum Note (Signed)
 Addended by: Francine Iron on: 05/20/2023 09:57 AM   Modules accepted: Orders

## 2023-05-20 NOTE — Progress Notes (Signed)
 BP 123/66   Pulse (!) 101   Ht 5\' 5"  (1.651 m)   Wt 224 lb (101.6 kg)   SpO2 100%   BMI 37.28 kg/m    Subjective:   Patient ID: Anna Bradford, female    DOB: 12-17-1933, 89 y.o.   MRN: [id number]  HPI: Anna Bradford is a 89 y.o. female presenting on 05/20/2023 for throat congestion and Insect Bite (Right forearm)   HPI Congestion and wheezing Patient is coming in today for congestion and wheezing.  She has done a few rounds of antibiotics and steroids and still having some congestion.  She does feel like it is better but she does feel like is not completely there.  She has been using her albuterol  inhaler and she is also been taking some allergy medicine and cold medicine and they did seem to help and she feels like she is improving and she feels like it is mostly head congestion with very little wheezing left.  Relevant past medical, surgical, family and social history reviewed and updated as indicated. Interim medical history since our last visit reviewed. Allergies and medications reviewed and updated.  Review of Systems  Constitutional:  Negative for chills and fever.  HENT:  Positive for congestion, rhinorrhea and sneezing. Negative for ear discharge, ear pain, postnasal drip, sinus pressure and sore throat.   Eyes:  Negative for pain, redness and visual disturbance.  Respiratory:  Positive for cough and wheezing. Negative for chest tightness and shortness of breath.   Cardiovascular:  Negative for chest pain and leg swelling.  Genitourinary:  Negative for difficulty urinating and dysuria.  Musculoskeletal:  Negative for back pain and gait problem.  Skin:  Negative for rash.  Neurological:  Negative for light-headedness and headaches.  Psychiatric/Behavioral:  Negative for agitation and behavioral problems.   All other systems reviewed and are negative.   Per HPI unless specifically indicated above   Allergies as of 05/20/2023       Reactions   Levofloxacin    Other (See  Comments) tendonitis   Nitrofurantoin    Other reaction(s): Other (See Comments) Bleeding thru bowels   Macrolides And Ketolides    Bleeding thru bowels   Codeine Nausea And Vomiting   Sulfa Antibiotics Nausea And Vomiting        Medication List        Accurate as of May 20, 2023  9:35 AM. If you have any questions, ask your nurse or doctor.          acetaminophen  325 MG tablet Commonly known as: TYLENOL  Take 650 mg by mouth daily with lunch.   albuterol  108 (90 Base) MCG/ACT inhaler Commonly known as: VENTOLIN  HFA Inhale 2 puffs into the lungs every 6 (six) hours as needed for wheezing or shortness of breath.   amitriptyline  50 MG tablet Commonly known as: ELAVIL  Take 1 tablet (50 mg total) by mouth at bedtime.   amLODipine  10 MG tablet Commonly known as: NORVASC  Take 1 tablet (10 mg total) by mouth daily.   baclofen  10 MG tablet Commonly known as: LIORESAL  Take 1 tablet (10 mg total) by mouth at bedtime as needed for muscle spasms.   benzonatate  200 MG capsule Commonly known as: TESSALON  Take 1 capsule (200 mg total) by mouth 2 (two) times daily as needed for cough.   clobetasol  cream 0.05 % Commonly known as: TEMOVATE  Apply topically 2 (two) times daily.   diphenhydrAMINE 25 MG tablet Commonly known as: BENADRYL Take 25  mg by mouth daily as needed for allergies.   famotidine  20 MG tablet Commonly known as: Pepcid  Take 1 tablet (20 mg total) by mouth 2 (two) times daily.   losartan -hydrochlorothiazide  100-25 MG tablet Commonly known as: HYZAAR Take 1 tablet by mouth daily.   magnesium hydroxide 400 MG/5ML suspension Commonly known as: MILK OF MAGNESIA Take 15 mLs by mouth every other day.   methocarbamol 500 MG tablet Commonly known as: ROBAXIN Take 500 mg by mouth 4 (four) times daily.   multivitamin tablet Take 1 tablet by mouth daily.   nystatin cream Commonly known as: MYCOSTATIN Apply 1 application  topically daily as needed for dry  skin.   Potassium 99 MG Tabs Take 99 mg by mouth daily.   rOPINIRole  2 MG tablet Commonly known as: REQUIP  TAKE ONE TABLET AT BEDTIME FOR LEG CRAMPS   SYSTANE OP Place 1 drop into both eyes daily as needed (dry eyes).   vitamin C 1000 MG tablet Take 1,000 mg by mouth daily.   Vitamin D-3 25 MCG (1000 UT) Caps Take 2,000 Units by mouth daily.         Objective:   BP 123/66   Pulse (!) 101   Ht 5\' 5"  (1.651 m)   Wt 224 lb (101.6 kg)   SpO2 100%   BMI 37.28 kg/m   Wt Readings from Last 3 Encounters:  05/20/23 224 lb (101.6 kg)  05/04/23 222 lb 6.4 oz (100.9 kg)  04/19/23 220 lb (99.8 kg)    Physical Exam Vitals and nursing note reviewed.  Constitutional:      General: She is not in acute distress.    Appearance: She is well-developed. She is not diaphoretic.  Eyes:     Conjunctiva/sclera: Conjunctivae normal.  Cardiovascular:     Rate and Rhythm: Normal rate and regular rhythm.     Heart sounds: Normal heart sounds. No murmur heard. Pulmonary:     Effort: Pulmonary effort is normal. No respiratory distress.     Breath sounds: Wheezing (Small amount of upper wheezes) present. No rhonchi or rales.  Chest:     Chest wall: No tenderness.  Skin:    General: Skin is warm and dry.     Findings: No rash.  Neurological:     Mental Status: She is alert and oriented to person, place, and time.     Coordination: Coordination normal.  Psychiatric:        Behavior: Behavior normal.       Assessment & Plan:   Problem List Items Addressed This Visit   None Visit Diagnoses       Reactive airways dysfunction syndrome (HCC)    -  Primary       Likely reactive airway still with some congestion.  Gave her a sample for Trelegy once daily to help clear up the congestion and continue with albuterol  inhaler and allergy medicines including Benadryl. Follow up plan: Return if symptoms worsen or fail to improve.  Counseling provided for all of the vaccine  components No orders of the defined types were placed in this encounter.   Jolyne Needs, MD [facility] 05/20/2023, 9:35 AM

## 2023-05-26 ENCOUNTER — Encounter: Payer: Self-pay | Admitting: Nurse Practitioner

## 2023-05-26 ENCOUNTER — Ambulatory Visit: Admitting: Nurse Practitioner

## 2023-05-26 ENCOUNTER — Ambulatory Visit: Payer: Self-pay

## 2023-05-26 VITALS — BP 130/77 | HR 92 | Temp 98.5°F | Ht 65.0 in | Wt 227.0 lb

## 2023-05-26 DIAGNOSIS — L03119 Cellulitis of unspecified part of limb: Secondary | ICD-10-CM | POA: Diagnosis not present

## 2023-05-26 MED ORDER — DOXYCYCLINE HYCLATE 100 MG PO TABS: 100.0000 mg | ORAL_TABLET | Freq: Two times a day (BID) | ORAL | 0 refills | Status: DC

## 2023-05-26 MED ORDER — FUROSEMIDE 20 MG PO TABS: 20.0000 mg | ORAL_TABLET | Freq: Every day | ORAL | 3 refills | Status: AC

## 2023-05-26 NOTE — Telephone Encounter (Signed)
  Chief Complain: leg swelling Symptoms: leg swelling and blisters Frequency: x 1 day Pertinent Negatives: Patient denies fever Disposition: [] ED /[] Urgent Care (no appt availability in office) / [x] Appointment(In office/virtual)/ []  [place] [facility]/ [] Home Care/ [] Refused Recommended Disposition /[] [place] [facility]/ []  Follow-up with PCP Additional Notes: pt states that she is having swelling and in both legs with blisters.  States redness from halfway to her knee to her ankle. States that she started taking some supplement for brain about 2 weeks ago and is wondering if that could be the cause.   Copied from CRM [id number]. Topic: Clinical - Red Word Triage >> May 26, 2023  1:47 PM Anna Bradford wrote: [name] that prompted transfer to Nurse Triage: Patient called in and stated that both of her legs are blistering and red, patient is not in any pain, her feet are swollen and her legs. Reason for Disposition  Looks like a boil, infected sore, deep ulcer or other infected rash (spreading redness, pus)  Answer Assessment - Initial Assessment Questions 1. ONSET: "When did the swelling start?" (e.g., minutes, hours, days)     Last night  2. LOCATION: "What part of the leg is swollen?"  "Are both legs swollen or just one leg?"     Both legs 3. SEVERITY: "How bad is the swelling?" (e.g., localized; mild, moderate, severe)   - Localized: Small area of swelling localized to one leg.   - MILD pedal edema: Swelling limited to foot and ankle, pitting edema < 1/4 inch (6 mm) deep, rest and elevation eliminate most or all swelling.   - MODERATE edema: Swelling of lower leg to knee, pitting edema > 1/4 inch (6 mm) deep, rest and elevation only partially reduce swelling.   - SEVERE edema: Swelling extends above knee, facial or hand swelling present.      slightly 4. REDNESS: "Does the swelling look red or infected?"     yes 5. PAIN: "Is the swelling painful to touch?" If Yes, ask: "How painful  is it?"   (Scale 1-10; mild, moderate or severe)     5/10 stinging 6. FEVER: "Do you have a fever?" If Yes, ask: "What is it, how was it measured, and when did it start?"      no 7. CAUSE: "What do you think is causing the leg swelling?"     unknown 8. MEDICAL HISTORY: "Do you have a history of blood clots (e.g., DVT), cancer, heart failure, kidney disease, or liver failure?"     no 10. OTHER SYMPTOMS: "Do you have any other symptoms?" (e.g., chest pain, difficulty breathing)       no  Protocols used: Leg Swelling and Edema-A-AH

## 2023-05-26 NOTE — Progress Notes (Addendum)
   Subjective:    Patient ID: Anna Bradford, female    DOB: [birth year], 89 y.o.   MRN: [id number]   Chief Complaint: cellulitis  HPI  Patient comes in with 2 complaints: - bil lower ext swelling with blister like lesions. Has been happening off and on for several weeks. - has a rash on upper thighs and arms. They do not itch.  Patient Active Problem List   Diagnosis Date Noted   Submental adenopathy 10/30/2020   Lipoma of torso 09/09/2020   Osteoarthritis 09/11/2018   Mass of subcutaneous tissue of back 01/19/2018   Mass of soft tissue of abdomen 01/19/2018   Severe obesity (BMI 35.0-39.9) with comorbidity (HCC) 12/13/2017   Osteoporosis without current pathological fracture 10/01/2015   Varicose veins of bilateral lower extremities with other complications 03/19/2013   Essential hypertension, benign 08/27/2011   GERD (gastroesophageal reflux disease) 08/27/2011       Review of Systems  Cardiovascular:  Positive for leg swelling. Negative for chest pain.  Skin:  Positive for rash.       Objective:   Physical Exam Constitutional:      Appearance: Normal appearance.  Cardiovascular:     Rate and Rhythm: Normal rate and regular rhythm.     Heart sounds: Normal heart sounds.  Pulmonary:     Effort: Pulmonary effort is normal.     Breath sounds: Normal breath sounds.  Musculoskeletal:     Right lower leg: Edema (2+) present.  Skin:    Comments: Bil ower ext erythematous with vesicular lesons.  Neurological:     General: No focal deficit present.     Mental Status: She is alert and oriented to person, place, and time.  Psychiatric:        Mood and Affect: Mood normal.        Behavior: Behavior normal.     BP 130/77   Pulse 92   Temp 98.5 F (36.9 C) (Temporal)   Ht 5\' 5"  (1.651 m)   Wt 227 lb (103 kg)   SpO2 94%   BMI 37.77 kg/m        Assessment & Plan:   NIKOLETTE BYERS in today with chief complaint of cellulitis  1. Cellulitis of lower extremity,  unspecified laterality (Primary) Elevate legs when sitting Follow  up monday - furosemide (LASIX) 20 MG tablet; Take 1 tablet (20 mg total) by mouth daily.  Dispense: 30 tablet; Refill: 3 - Apply unna boot  2. Atopic dermatitis Watch rash Will see if resolves on its own  The above assessment and management plan was discussed with the patient. The patient verbalized understanding of and has agreed to the management plan. Patient is aware to call the clinic if symptoms persist or worsen. Patient is aware when to return to the clinic for a follow-up visit. Patient educated on when it is appropriate to go to the emergency department.   Mary-Margaret Gaylyn Keas, FNP

## 2023-05-26 NOTE — Patient Instructions (Signed)
 Cellulitis, Adult    Cellulitis is a skin infection. The infected area is often warm, red, swollen, and sore. It occurs most often on the legs, feet, and toes, but can happen on any part of the body.  This condition can be life-threatening without treatment. It is very important to get treated right away.  What are the causes?  This condition is caused by bacteria. The bacteria enter through a break in the skin, such as:  A cut.  A burn.  A bug bite.  An animal bite.  An open sore.  A crack.  What increases the risk?  Having a weak body's defense system (immune system).  Being older than 88 years old.  Having a blood sugar problem (diabetes).  Having a long-term liver disease (cirrhosis) or kidney disease.  Being very overweight (obese).  Having a skin problem, such as:  An itchy rash.  A rash caused by a fungus.  A rash with blisters.  Slow movement of blood in the veins (venous stasis).  Fluid buildup below the skin (edema).  This condition is more likely to occur in people who:  Have open cuts, burns, bites, or scrapes on the skin.  Have been treated with high-energy rays (radiation).  Use IV drugs.  What are the signs or symptoms?  Skin that:  Looks red or purple, or slightly darker than your usual skin color.  Has streaks.  Has spots.  Is swollen.  Is sore or painful when you touch it.  Is warm.  A fever.  Chills.  Blisters.  Tiredness (fatigue).  How is this treated?  Medicines to treat infections or allergies.  Rest.  Placing cold or warm cloths on the skin.  Staying in the hospital, if the condition is very bad. You may need medicines through an IV.  Follow these instructions at home:  Medicines  Take over-the-counter and prescription medicines only as told by your doctor.  If you were prescribed antibiotics, take them as told by your doctor. Do not stop using them even if you start to feel better.  General instructions  Drink enough fluid to keep your pee (urine) pale yellow.  Do not touch or rub the  infected area.  Raise (elevate) the infected area above the level of your heart while you are sitting or lying down.  Return to your normal activities when your doctor says that it is safe.  Place cold or warm cloths on the area as told by your doctor.  Keep all follow-up visits. Your doctor will need to make sure that a more serious infection is not developing.  Contact a doctor if:  You have a fever.  You do not start to get better after 1-2 days of treatment.  Your bone or joint under the infected area starts to hurt after the skin has healed.  Your infection comes back in the same area or another area. Signs of this may include:  You have a swollen bump in the area.  Your red area gets larger, turns dark in color, or hurts more.  You have more fluid coming from the wound.  Pus or a bad smell develops in your infected area.  You have more pain.  You feel sick and have muscle aches and weakness.  You develop vomiting or watery poop that will not go away.  Get help right away if:  You see red streaks coming from the area.  You notice the skin turns purple or black and falls  off.  These symptoms may be an emergency. Get help right away. Call 911.  Do not wait to see if the symptoms will go away.  Do not drive yourself to the hospital.  This information is not intended to replace advice given to you by your health care provider. Make sure you discuss any questions you have with your health care provider.  Document Revised: 09/01/2021 Document Reviewed: 09/01/2021  Elsevier Patient Education  2024 [employer].

## 2023-05-30 ENCOUNTER — Encounter: Payer: Self-pay | Admitting: Nurse Practitioner

## 2023-05-30 ENCOUNTER — Ambulatory Visit (INDEPENDENT_AMBULATORY_CARE_PROVIDER_SITE_OTHER): Admitting: Nurse Practitioner

## 2023-05-30 VITALS — BP 133/68 | HR 112 | Temp 97.2°F | Ht 65.0 in | Wt 224.0 lb

## 2023-05-30 DIAGNOSIS — L03119 Cellulitis of unspecified part of limb: Secondary | ICD-10-CM

## 2023-05-30 NOTE — Patient Instructions (Signed)
 Peripheral Edema  Peripheral edema is swelling that is caused by a buildup of fluid. Peripheral edema most often affects the lower legs, ankles, and feet. It can also develop in the arms, hands, and face. The area of the body that has peripheral edema will look swollen. It may also feel heavy or warm. Your clothes may start to feel tight. Pressing on the area may make a temporary dent in your skin (pitting edema). You may not be able to move your swollen arm or leg as much as usual. There are many causes of peripheral edema. It can happen because of a complication of other conditions such as heart failure, kidney disease, or a problem with your circulation. It also can be a side effect of certain medicines or happen because of an infection. It often happens to women during pregnancy. Sometimes, the cause is not known. Follow these instructions at home: Managing pain, stiffness, and swelling  Raise (elevate) your legs while you are sitting or lying down. Move around often to prevent stiffness and to reduce swelling. Do not sit or stand for long periods of time. Do not wear tight clothing. Do not wear garters on your upper legs. Exercise your legs to get your circulation going. This helps to move the fluid back into your blood vessels, and it may help the swelling go down. Wear compression stockings as told by your health care provider. These stockings help to prevent blood clots and reduce swelling in your legs. It is important that these are the correct size. These stockings should be prescribed by your doctor to prevent possible injuries. If elastic bandages or wraps are recommended, use them as told by your health care provider. Medicines Take over-the-counter and prescription medicines only as told by your health care provider. Your health care provider may prescribe medicine to help your body get rid of excess water (diuretic). Take this medicine if you are told to take it. General  instructions Eat a low-salt (low-sodium) diet as told by your health care provider. Sometimes, eating less salt may reduce swelling. Pay attention to any changes in your symptoms. Moisturize your skin daily to help prevent skin from cracking and draining. Keep all follow-up visits. This is important. Contact a health care provider if: You have a fever. You have swelling in only one leg. You have increased swelling, redness, or pain in one or both of your legs. You have drainage or sores at the area where you have edema. Get help right away if: You have edema that starts suddenly or is getting worse, especially if you are pregnant or have a medical condition. You develop shortness of breath, especially when you are lying down. You have pain in your chest or abdomen. You feel weak. You feel like you will faint. These symptoms may be an emergency. Get help right away. Call 911. Do not wait to see if the symptoms will go away. Do not drive yourself to the hospital. Summary Peripheral edema is swelling that is caused by a buildup of fluid. Peripheral edema most often affects the lower legs, ankles, and feet. Move around often to prevent stiffness and to reduce swelling. Do not sit or stand for long periods of time. Pay attention to any changes in your symptoms. Contact a health care provider if you have edema that starts suddenly or is getting worse, especially if you are pregnant or have a medical condition. Get help right away if you develop shortness of breath, especially when lying down.  This information is not intended to replace advice given to you by your health care provider. Make sure you discuss any questions you have with your health care provider. Document Revised: 09/08/2020 Document Reviewed: 09/08/2020 Elsevier Patient Education  2024 [employer].

## 2023-05-30 NOTE — Progress Notes (Signed)
   Subjective:    Patient ID: Anna Bradford, female    DOB: 1933/03/27, 89 y.o.   MRN: [id number]   Chief Complaint: Alton Au boots   HPI  Patient was seen on 05/26/23 with cellulitis of bil lower extr. We treated her with lasix  and unna boots. She is here today for removal of unna boots.  Patient Active Problem List   Diagnosis Date Noted   Submental adenopathy 10/30/2020   Lipoma of torso 09/09/2020   Osteoarthritis 09/11/2018   Mass of subcutaneous tissue of back 01/19/2018   Mass of soft tissue of abdomen 01/19/2018   Severe obesity (BMI 35.0-39.9) with comorbidity (HCC) 12/13/2017   Osteoporosis without current pathological fracture 10/01/2015   Varicose veins of bilateral lower extremities with other complications 03/19/2013   Essential hypertension, benign 08/27/2011   GERD (gastroesophageal reflux disease) 08/27/2011       Review of Systems  Constitutional:  Negative for diaphoresis.  Eyes:  Negative for pain.  Respiratory:  Negative for shortness of breath.   Cardiovascular:  Negative for chest pain, palpitations and leg swelling.  Gastrointestinal:  Negative for abdominal pain.  Endocrine: Negative for polydipsia.  Skin:  Negative for rash.  Neurological:  Negative for dizziness, weakness and headaches.  Hematological:  Does not bruise/bleed easily.  All other systems reviewed and are negative.      Objective:   Physical Exam Constitutional:      Appearance: Normal appearance. She is obese.  Cardiovascular:     Rate and Rhythm: Normal rate and regular rhythm.  Pulmonary:     Effort: Pulmonary effort is normal.     Breath sounds: Normal breath sounds.  Musculoskeletal:     Comments: Bil una boots removed- no edema today. Mild erythema on left lower leg.  Neurological:     General: No focal deficit present.     Mental Status: She is alert and oriented to person, place, and time.  Psychiatric:        Mood and Affect: Mood normal.        Behavior:  Behavior normal.     BP 133/68   Pulse (!) 112   Temp (!) 97.2 F (36.2 C) (Temporal)   Ht 5\' 5"  (1.651 m)   Wt 224 lb (101.6 kg)   BMI 37.28 kg/m        Assessment & Plan:   Anna Bradford in today with chief complaint of [place] unna boots   1. Cellulitis of lower extremity, unspecified laterality (Primary) Elevate legs when sitting Continue daily lasix  RTO prn    The above assessment and management plan was discussed with the patient. The patient verbalized understanding of and has agreed to the management plan. Patient is aware to call the clinic if symptoms persist or worsen. Patient is aware when to return to the clinic for a follow-up visit. Patient educated on when it is appropriate to go to the emergency department.   Mary-Margaret Gaylyn Keas, FNP

## 2023-06-22 ENCOUNTER — Telehealth: Payer: Self-pay

## 2023-06-22 NOTE — Telephone Encounter (Signed)
 Copied from CRM [id number]. Topic: General - Other >> Jun 22, 2023  9:53 AM Star East wrote: Reason for CRM: Patient returning call to office, no message left, please leave a message if no answer- [phone]

## 2023-06-22 NOTE — Telephone Encounter (Signed)
 No answer no machine. Pt has not had recent labs or imaging.

## 2023-06-23 ENCOUNTER — Telehealth: Payer: Self-pay

## 2023-06-23 NOTE — Telephone Encounter (Signed)
 Copied from CRM [id number]. Topic: General - Other >> Jun 22, 2023  5:06 PM Santiya F wrote: Reason for CRM: Patient calling in returning a call from the office.

## 2023-06-23 NOTE — Telephone Encounter (Signed)
 Copied from CRM [id number]. Topic: General - Other >> Jun 22, 2023  5:06 PM Santiya F wrote: Reason for CRM: Patient calling in returning a call from the office. >> Jun 23, 2023  8:45 AM Emylou G wrote: Adv patient we didn't call her

## 2023-06-23 NOTE — Telephone Encounter (Signed)
 We have not called the pt. Tried to reach out again with no answer. Closing message.

## 2023-06-23 NOTE — Telephone Encounter (Signed)
 We have not tried to call patient left message to let patient know.

## 2023-06-29 ENCOUNTER — Ambulatory Visit: Payer: Self-pay

## 2023-06-29 NOTE — Telephone Encounter (Signed)
 FYI Only or Action Required?: FYI only for provider  Patient was last seen in primary care on 05/30/2023 by Delfina Feller, FNP. Called Nurse Triage reporting Memory Loss. Symptoms began several weeks ago. Interventions attempted: Nothing. Symptoms are: unchanged.  Triage Disposition: See Physician Within 24 Hours  Patient/caregiver understands and will follow disposition?: Yes   Reason for Disposition  [1] Worsening confusion AND [2] gradual onset (days to weeks)  Answer Assessment - Initial Assessment Questions 1. MAIN CONCERN OR SYMPTOM:  What is your main concern right now? What questions do you have? What's the main symptom you're worried about? (e.g., confusion, memory loss)     forgetful 2. ONSET:  When did the symptom start (or worsen)? (minutes, hours, days, weeks)     Two weeks ago 3. BETTER-SAME-WORSE: Are you (the patient) getting better, staying the same, or getting worse compared to the day you (they) were diagnosed or most recent hospital discharge ?     Worse, states was in [place] and had paperwork filled out and went back to the car and left the papers in the buggy.  She had to go back in the store to get paper work.  Also recently lost keys and they were located in the car that was still running 4. DIAGNOSIS: Was the dementia diagnosed by a doctor? If Yes, ask: When? (e.g., days, months, years ago)     no 5. MEDICINES: Has there been any change in medicines recently? (e.g., narcotics, antihistamines, benzodiazepines, etc.)     no 6. OTHER SYMPTOMS: Are there any other symptoms? (e.g., fever, cough, pain, falling)     cough 7. SUPPORT: Document living circumstances and support (e.g., family, nursing home)     yes  Protocols used: Dementia Symptoms and Questions-A-AH

## 2023-06-29 NOTE — Telephone Encounter (Signed)
 Apt tomorrow. LS

## 2023-06-29 NOTE — Telephone Encounter (Signed)
     Copied From CRM [id number]. Reason for Triage: patient is saying lately she's very forgetful. Stated one time she forgot to turn off the car, placed her phone down then forgot where it was a minute later. [phone]

## 2023-06-30 ENCOUNTER — Encounter: Payer: Self-pay | Admitting: Nurse Practitioner

## 2023-06-30 ENCOUNTER — Ambulatory Visit: Admitting: Nurse Practitioner

## 2023-06-30 VITALS — BP 131/69 | HR 76 | Temp 98.1°F | Ht 65.0 in | Wt 228.0 lb

## 2023-06-30 DIAGNOSIS — R413 Other amnesia: Secondary | ICD-10-CM

## 2023-06-30 DIAGNOSIS — L03119 Cellulitis of unspecified part of limb: Secondary | ICD-10-CM

## 2023-06-30 DIAGNOSIS — R238 Other skin changes: Secondary | ICD-10-CM

## 2023-06-30 MED ORDER — DOXYCYCLINE HYCLATE 100 MG PO TABS: 100.0000 mg | ORAL_TABLET | Freq: Two times a day (BID) | ORAL | 0 refills | Status: DC

## 2023-06-30 NOTE — Progress Notes (Signed)
 Subjective:    Patient ID: ANARA COWMAN, female    DOB: 03-Feb-1933, 89 y.o.   MRN: [id number]   Chief Complaint: Blister on left arm (Pulled off tick and not has a blister), Bilateral legs red , and Memory issues   HPI  Patient come sin today with several complaints: - tick bite- said was stuck but when she pulled it off it left a blister on her arm. - bil lower ext are erythematous and swelling - memory issues- feel like I am crazy. Says she is just getting forgetful. Looses things.     06/30/2023    9:27 AM 12/22/2022    4:02 PM 12/14/2021    2:11 PM 11/09/2019   11:34 AM 11/07/2018    1:45 PM  6CIT Screen  What Year? 0 points 0 points 0 points 0 points 0 points  What month? 0 points 0 points 0 points 0 points 0 points  What time? 0 points 0 points 0 points 0 points 0 points  Count back from 20 2 points 0 points 0 points 0 points 0 points  Months in reverse 2 points 2 points 0 points 0 points 0 points  Repeat phrase 2 points 0 points 0 points 0 points 0 points  Total Score 6 points 2 points 0 points 0 points 0 points     Patient Active Problem List   Diagnosis Date Noted   Submental adenopathy 10/30/2020   Lipoma of torso 09/09/2020   Osteoarthritis 09/11/2018   Mass of subcutaneous tissue of back 01/19/2018   Mass of soft tissue of abdomen 01/19/2018   Severe obesity (BMI 35.0-39.9) with comorbidity (HCC) 12/13/2017   Osteoporosis without current pathological fracture 10/01/2015   Varicose veins of bilateral lower extremities with other complications 03/19/2013   Essential hypertension, benign 08/27/2011   GERD (gastroesophageal reflux disease) 08/27/2011       Review of Systems  Constitutional:  Negative for diaphoresis.  Eyes:  Negative for pain.  Respiratory:  Negative for shortness of breath.   Cardiovascular:  Negative for chest pain and palpitations. Leg swelling: mild. Gastrointestinal:  Negative for abdominal pain.  Endocrine: Negative for  polydipsia.  Skin:  Negative for rash.  Neurological:  Negative for dizziness, weakness and headaches.  Hematological:  Does not bruise/bleed easily.  All other systems reviewed and are negative.      Objective:   Physical Exam Constitutional:      Appearance: Normal appearance. She is obese.   Cardiovascular:     Rate and Rhythm: Normal rate and regular rhythm.     Heart sounds: Normal heart sounds.  Pulmonary:     Effort: Pulmonary effort is normal.     Breath sounds: Normal breath sounds.   Musculoskeletal:     Right lower leg: Edema (mild) present.     Left lower leg: Edema (mild) present.   Skin:    General: Skin is warm.     Comments: Clear vesicular lesion on left forearm Bilateral lower ext erythema- warm to touch    Neurological:     General: No focal deficit present.     Mental Status: She is alert and oriented to person, place, and time.   Psychiatric:        Mood and Affect: Mood normal.        Behavior: Behavior normal.    BP 131/69   Pulse 76   Temp 98.1 F (36.7 C) (Temporal)   Ht 5' 5 (1.651 m)   Wt  228 lb (103.4 kg)   SpO2 97%   BMI 37.94 kg/m          Assessment & Plan:   ARYIA DELIRA in today with chief complaint of Blister on left arm (Pulled off tick and not has a blister), Bilateral legs red , and Memory issues   1. Vesicular lesion (Primary) Bandage applied- change in 2 days Do not pick at area  2. Cellulitis of lower extremity, unspecified laterality Elevate legs when sitting Take al of antibiotics - doxycycline  (VIBRA -TABS) 100 MG tablet; Take 1 tablet (100 mg total) by mouth 2 (two) times daily. 1 po bid  Dispense: 20 tablet; Refill: 0  3. Memory deficits Not significant enough to treat with medications Encouraged to read daily- play word search puzzles    The above assessment and management plan was discussed with the patient. The patient verbalized understanding of and has agreed to the management plan. Patient is  aware to call the clinic if symptoms persist or worsen. Patient is aware when to return to the clinic for a follow-up visit. Patient educated on when it is appropriate to go to the emergency department.   Mary-Margaret Gaylyn Keas, FNP

## 2023-06-30 NOTE — Patient Instructions (Signed)
 Cellulitis, Adult    Cellulitis is a skin infection. The infected area is often warm, red, swollen, and sore. It occurs most often on the legs, feet, and toes, but can happen on any part of the body.  This condition can be life-threatening without treatment. It is very important to get treated right away.  What are the causes?  This condition is caused by bacteria. The bacteria enter through a break in the skin, such as:  A cut.  A burn.  A bug bite.  An animal bite.  An open sore.  A crack.  What increases the risk?  Having a weak body's defense system (immune system).  Being older than 88 years old.  Having a blood sugar problem (diabetes).  Having a long-term liver disease (cirrhosis) or kidney disease.  Being very overweight (obese).  Having a skin problem, such as:  An itchy rash.  A rash caused by a fungus.  A rash with blisters.  Slow movement of blood in the veins (venous stasis).  Fluid buildup below the skin (edema).  This condition is more likely to occur in people who:  Have open cuts, burns, bites, or scrapes on the skin.  Have been treated with high-energy rays (radiation).  Use IV drugs.  What are the signs or symptoms?  Skin that:  Looks red or purple, or slightly darker than your usual skin color.  Has streaks.  Has spots.  Is swollen.  Is sore or painful when you touch it.  Is warm.  A fever.  Chills.  Blisters.  Tiredness (fatigue).  How is this treated?  Medicines to treat infections or allergies.  Rest.  Placing cold or warm cloths on the skin.  Staying in the hospital, if the condition is very bad. You may need medicines through an IV.  Follow these instructions at home:  Medicines  Take over-the-counter and prescription medicines only as told by your doctor.  If you were prescribed antibiotics, take them as told by your doctor. Do not stop using them even if you start to feel better.  General instructions  Drink enough fluid to keep your pee (urine) pale yellow.  Do not touch or rub the  infected area.  Raise (elevate) the infected area above the level of your heart while you are sitting or lying down.  Return to your normal activities when your doctor says that it is safe.  Place cold or warm cloths on the area as told by your doctor.  Keep all follow-up visits. Your doctor will need to make sure that a more serious infection is not developing.  Contact a doctor if:  You have a fever.  You do not start to get better after 1-2 days of treatment.  Your bone or joint under the infected area starts to hurt after the skin has healed.  Your infection comes back in the same area or another area. Signs of this may include:  You have a swollen bump in the area.  Your red area gets larger, turns dark in color, or hurts more.  You have more fluid coming from the wound.  Pus or a bad smell develops in your infected area.  You have more pain.  You feel sick and have muscle aches and weakness.  You develop vomiting or watery poop that will not go away.  Get help right away if:  You see red streaks coming from the area.  You notice the skin turns purple or black and falls  off.  These symptoms may be an emergency. Get help right away. Call 911.  Do not wait to see if the symptoms will go away.  Do not drive yourself to the hospital.  This information is not intended to replace advice given to you by your health care provider. Make sure you discuss any questions you have with your health care provider.  Document Revised: 09/01/2021 Document Reviewed: 09/01/2021  Elsevier Patient Education  2024 [employer].

## 2023-07-01 ENCOUNTER — Ambulatory Visit: Admitting: Family Medicine

## 2023-07-04 ENCOUNTER — Encounter: Payer: Self-pay | Admitting: Family Medicine

## 2023-07-21 ENCOUNTER — Encounter: Payer: Self-pay | Admitting: Family Medicine

## 2023-07-21 ENCOUNTER — Ambulatory Visit: Payer: Medicare Other | Admitting: Family Medicine

## 2023-07-21 VITALS — BP 117/67 | HR 89 | Ht 65.0 in | Wt 224.0 lb

## 2023-07-21 DIAGNOSIS — R7309 Other abnormal glucose: Secondary | ICD-10-CM | POA: Diagnosis not present

## 2023-07-21 DIAGNOSIS — K219 Gastro-esophageal reflux disease without esophagitis: Secondary | ICD-10-CM | POA: Diagnosis not present

## 2023-07-21 DIAGNOSIS — M51379 Other intervertebral disc degeneration, lumbosacral region without mention of lumbar back pain or lower extremity pain: Secondary | ICD-10-CM

## 2023-07-21 DIAGNOSIS — I10 Essential (primary) hypertension: Secondary | ICD-10-CM | POA: Diagnosis not present

## 2023-07-21 LAB — LIPID PANEL

## 2023-07-21 LAB — BAYER DCA HB A1C WAIVED: HB A1C (BAYER DCA - WAIVED): 5.4 % (ref 4.8–5.6)

## 2023-07-21 NOTE — Progress Notes (Signed)
 Established Patient Office Visit  Subjective   Patient ID: Anna Bradford, female    DOB: January 28, 1933  Age: 89 y.o. MRN: [id number]  Chief Complaint  Patient presents with   Medical Management of Chronic Issues   Hypertension   Gastroesophageal Reflux    Hypertension Pertinent negatives include no blurred vision, chest pain, headaches, malaise/fatigue or shortness of breath.  Gastroesophageal Reflux She reports no abdominal pain, no chest pain, no coughing, no heartburn, no nausea or no sore throat. Pertinent negatives include no weight loss.   HPI (1) Hypertension  The patient is present for a routine follow-up visit for management of hypertension. Patient report overall good blood pressure control since the last visit. She reports that she does not monitor her blood pressure levels at home. The patient denies any recent episodes of headache, dizziness, vision changes, chest pain, shortness of breath, or palpitations. Patient report good adherence to their antihypertensive medications, which currently include Losartan , Amlodipine , and Furosemide . Patient is aware of the importance of low-sodium dietary habits and report following the dietary recommendations. No recent hospitalizations, ER visits, or medication changes. No new questions or concerns.   (2) GERD The patient is present for a routine follow-up visit for management of GERD. She reports that her GERD is well controlled on Famotidine  and she has not experienced any new flare-ups. She denies abdominal pain, blood in stool, chest pain, coughing, heartburn, nausea or sore throat. No new questions or concerns.  (3) Lower Back Pain Long-term muscle strains and myalgias. Patient takes amitriptyline  and baclofen  for long-term muscle strains and myalgias and they do help some.  She was offered a shot in her lower back by orthopedic and spine specialist, but she denied because she was told by friends that it does not work as well. She  reports that her back has been bothering her recently with the pain being localized to lower right side. She denies any urinary symptoms including hematuria, burning, pain, frequency, dysuria, or flank pain. She also denies any tenderness to palpation and systemic symptoms like fever, chills, or body aches.    Review of Systems  Constitutional:  Negative for chills, fever, malaise/fatigue and weight loss.  HENT:  Negative for congestion, hearing loss and sore throat.   Eyes:  Negative for blurred vision.  Respiratory:  Negative for cough and shortness of breath.   Cardiovascular:  Negative for chest pain and leg swelling.  Gastrointestinal:  Negative for abdominal pain, constipation, diarrhea, heartburn, nausea and vomiting.  Genitourinary:  Negative for dysuria and urgency.  Musculoskeletal:  Negative for joint pain and myalgias.  Skin:  Negative for itching and rash.  Neurological:  Negative for dizziness, tingling and headaches.      Objective:     BP 117/67   Pulse 89   Ht 5' 5 (1.651 m)   Wt 224 lb (101.6 kg)   SpO2 95%   BMI 37.28 kg/m    Physical Exam Constitutional:      General: She is not in acute distress.    Appearance: Normal appearance.  Cardiovascular:     Rate and Rhythm: Normal rate and regular rhythm.     Pulses: Normal pulses.     Heart sounds: Normal heart sounds. No murmur heard. Pulmonary:     Effort: Pulmonary effort is normal. No respiratory distress.     Breath sounds: Normal breath sounds.  Neurological:     Mental Status: She is alert and oriented to person, place, and time.  Psychiatric:        Mood and Affect: Mood normal.        Behavior: Behavior normal.      No results found for any visits on 07/21/23.   (1) A1C - 5.4  The ASCVD Risk score (Arnett DK, et al., 2019) failed to calculate for the following reasons:   The 2019 ASCVD risk score is only valid for ages 79 to 73    Assessment & Plan:   Problem List Items Addressed  This Visit       Cardiovascular and Mediastinum   Essential hypertension, benign - Primary   Relevant Orders   Bayer DCA Hb A1c Waived (Completed)   CBC with Differential/Platelet (Completed)   CMP14+EGFR (Completed)   Lipid panel (Completed)     Digestive   GERD (gastroesophageal reflux disease)     Musculoskeletal and Integument   Degenerative disc disease at L5-S1 level    (1) Hypertension - Assessment: Well controlled on medication.  - Plan: Continue taking Losartan , Amlodipine , and Furosemide  as prescribed. Advised to bring all medications at next visit to check dosage and ensure that she is taking medications as prescribed.   (2) GERD - Assessment: Well controlled on medication. - Plan: Continue taking Famotidine  as prescribed. Advised to monitor diet and portions to help prevent flare-ups.   (3) Lower Back Pain - Assessment: Most likely chronic back pain due to long standing arthritis and spinal degeneration. Infectious causes less likely given lack of systemic symptoms. Kidney stones are also less likely given the lack of red-flag symptoms.  - Plan: Advised to reach out to orthopedic and spine specialist to further evaluate and treat.    Return in about 6 months (around 01/21/2024), or if symptoms worsen or fail to improve, for hypertension and GERD .   Dotty Blanch, Medical Student  [employer]  at [facility] 07/21/23 3:31 PM   I was personally present for all components of the history, physical exam and/or medical decision making.  I agree with the documentation performed by the student and agree with assessment and plan above.  Fonda Levins, MD [facility] 08/03/2023, 12:46 PM

## 2023-07-22 LAB — LIPID PANEL
Chol/HDL Ratio: 4 ratio (ref 0.0–4.4)
Cholesterol, Total: 152 mg/dL (ref 100–199)
HDL: 38 mg/dL — ABNORMAL LOW (ref >39)
LDL Chol Calc (NIH): 82 mg/dL (ref 0–99)
Triglycerides: 189 mg/dL — ABNORMAL HIGH (ref 0–149)
VLDL Cholesterol Cal: 32 mg/dL (ref 5–40)

## 2023-07-22 LAB — CBC WITH DIFFERENTIAL/PLATELET
Basophils Absolute: 0.1 x10E3/uL (ref 0.0–0.2)
Basos: 1 %
EOS (ABSOLUTE): 0.5 x10E3/uL — ABNORMAL HIGH (ref 0.0–0.4)
Eos: 6 %
Hematocrit: 39.5 % (ref 34.0–46.6)
Hemoglobin: 13 g/dL (ref 11.1–15.9)
Immature Grans (Abs): 0 x10E3/uL (ref 0.0–0.1)
Immature Granulocytes: 0 %
Lymphocytes Absolute: 2.3 x10E3/uL (ref 0.7–3.1)
Lymphs: 28 %
MCH: 30.4 pg (ref 26.6–33.0)
MCHC: 32.9 g/dL (ref 31.5–35.7)
MCV: 92 fL (ref 79–97)
Monocytes Absolute: 0.8 x10E3/uL (ref 0.1–0.9)
Monocytes: 10 %
Neutrophils Absolute: 4.6 x10E3/uL (ref 1.4–7.0)
Neutrophils: 55 %
Platelets: 287 x10E3/uL (ref 150–450)
RBC: 4.28 x10E6/uL (ref 3.77–5.28)
RDW: 13.3 % (ref 11.7–15.4)
WBC: 8.2 x10E3/uL (ref 3.4–10.8)

## 2023-07-22 LAB — CMP14+EGFR
ALT: 15 IU/L (ref 0–32)
AST: 18 IU/L (ref 0–40)
Albumin: 3.8 g/dL (ref 3.7–4.7)
Alkaline Phosphatase: 102 IU/L (ref 44–121)
BUN/Creatinine Ratio: 14 (ref 12–28)
BUN: 18 mg/dL (ref 8–27)
Bilirubin Total: 0.3 mg/dL (ref 0.0–1.2)
CO2: 24 mmol/L (ref 20–29)
Calcium: 9.1 mg/dL (ref 8.7–10.3)
Chloride: 103 mmol/L (ref 96–106)
Creatinine, Ser: 1.28 mg/dL — ABNORMAL HIGH (ref 0.57–1.00)
Globulin, Total: 2.4 g/dL (ref 1.5–4.5)
Glucose: 97 mg/dL (ref 70–99)
Potassium: 3.7 mmol/L (ref 3.5–5.2)
Sodium: 145 mmol/L — ABNORMAL HIGH (ref 134–144)
Total Protein: 6.2 g/dL (ref 6.0–8.5)
eGFR: 41 mL/min/1.73 — ABNORMAL LOW (ref >59)

## 2023-07-25 ENCOUNTER — Ambulatory Visit: Payer: Self-pay | Admitting: Family Medicine

## 2023-07-25 DIAGNOSIS — E860 Dehydration: Secondary | ICD-10-CM

## 2023-07-25 DIAGNOSIS — M51379 Other intervertebral disc degeneration, lumbosacral region without mention of lumbar back pain or lower extremity pain: Secondary | ICD-10-CM

## 2023-08-01 ENCOUNTER — Ambulatory Visit (INDEPENDENT_AMBULATORY_CARE_PROVIDER_SITE_OTHER)

## 2023-08-01 ENCOUNTER — Other Ambulatory Visit

## 2023-08-01 DIAGNOSIS — M51369 Other intervertebral disc degeneration, lumbar region without mention of lumbar back pain or lower extremity pain: Secondary | ICD-10-CM | POA: Diagnosis not present

## 2023-08-01 DIAGNOSIS — E860 Dehydration: Secondary | ICD-10-CM

## 2023-08-01 DIAGNOSIS — M51379 Other intervertebral disc degeneration, lumbosacral region without mention of lumbar back pain or lower extremity pain: Secondary | ICD-10-CM | POA: Diagnosis not present

## 2023-08-01 DIAGNOSIS — M438X6 Other specified deforming dorsopathies, lumbar region: Secondary | ICD-10-CM | POA: Diagnosis not present

## 2023-08-01 DIAGNOSIS — M47816 Spondylosis without myelopathy or radiculopathy, lumbar region: Secondary | ICD-10-CM | POA: Diagnosis not present

## 2023-08-01 DIAGNOSIS — M48061 Spinal stenosis, lumbar region without neurogenic claudication: Secondary | ICD-10-CM | POA: Diagnosis not present

## 2023-08-02 LAB — BASIC METABOLIC PANEL WITH GFR
BUN/Creatinine Ratio: 21 (ref 12–28)
BUN: 20 mg/dL (ref 8–27)
CO2: 23 mmol/L (ref 20–29)
Calcium: 9.2 mg/dL (ref 8.7–10.3)
Chloride: 103 mmol/L (ref 96–106)
Creatinine, Ser: 0.97 mg/dL (ref 0.57–1.00)
Glucose: 91 mg/dL (ref 70–99)
Potassium: 4 mmol/L (ref 3.5–5.2)
Sodium: 141 mmol/L (ref 134–144)
eGFR: 57 mL/min/1.73 — ABNORMAL LOW (ref >59)

## 2023-08-03 LAB — SPECIMEN STATUS REPORT

## 2023-08-03 LAB — VITAMIN D 25 HYDROXY (VIT D DEFICIENCY, FRACTURES): Vit D, 25-Hydroxy: 84.9 ng/mL (ref 30.0–100.0)

## 2023-08-04 ENCOUNTER — Ambulatory Visit: Payer: Self-pay | Admitting: Family Medicine

## 2023-08-17 ENCOUNTER — Other Ambulatory Visit: Payer: Self-pay | Admitting: Family Medicine

## 2023-08-17 DIAGNOSIS — M51379 Other intervertebral disc degeneration, lumbosacral region without mention of lumbar back pain or lower extremity pain: Secondary | ICD-10-CM

## 2023-08-17 DIAGNOSIS — M545 Low back pain, unspecified: Secondary | ICD-10-CM

## 2023-08-19 DIAGNOSIS — H10013 Acute follicular conjunctivitis, bilateral: Secondary | ICD-10-CM | POA: Diagnosis not present

## 2023-10-19 ENCOUNTER — Telehealth: Payer: Self-pay

## 2023-10-19 NOTE — Telephone Encounter (Signed)
 Copied from CRM #[id number]. Topic: Clinical - Medication Question >> Oct 19, 2023 11:30 AM Willma R wrote: Reason for CRM: Patient is currently taking amitriptyline  (ELAVIL ) 50 MG tablet, which causes falls and confusion, they are requesting to see if the patient can be prescribed something else.  Hassina can be reached at [phone]

## 2023-10-19 NOTE — Telephone Encounter (Signed)
 She is taking it for neuropathic pain and to help with sleep, we can always discuss this further with her when she comes back next time, knowing the risks factors but it does help us  with to issues that she has.  Will discuss further with patient when she returns

## 2023-11-25 ENCOUNTER — Ambulatory Visit: Payer: Self-pay

## 2023-11-25 NOTE — Telephone Encounter (Signed)
 Patient scheduled for knee injection 11/10 at 9:10 am with Dr. Maryanne.

## 2023-11-25 NOTE — Telephone Encounter (Signed)
 FYI Only or Action Required?: Action required by provider: request for appointment. Patient would like to schedule injection for knee  Patient was last seen in primary care on 07/21/2023 by Dettinger, Fonda LABOR, MD.  Called Nurse Triage reporting Hip Pain and Knee Pain.  Symptoms began knee pain x one week.  Chronic back pain.  Interventions attempted: OTC medications: ibuprofen and Rest, hydration, or home remedies.  Symptoms are: unchanged.  Triage Disposition: See PCP When Office is Open (Within 3 Days)  Patient/caregiver understands and will follow disposition?: Yes Copied from CRM #[id number]. Topic: Clinical - Red Word Triage >> Nov 25, 2023  2:24 PM Tinnie BROCKS wrote: Red Word that prompted transfer to Nurse Triage: Severe pain in hip and knees that started last Saturday. She rates the pain 8 out of 10 when walking. She is wanting an appointment for joint injection with [facility]. Per [facility], this is usually scheduled as a procedure. Next avail was for 11/26, too far out for her. Reason for Disposition  [1] MODERATE pain (e.g., interferes with normal activities, limping) AND [2] present > 3 days  [1] MODERATE back pain (e.g., interferes with normal activities) AND [2] present > 3 days  Answer Assessment - Initial Assessment Questions Has tried pain patches and hot arthritis medicine and it helps a little, Ibuprofen helps a little too  Contacted CAL, Annette,  who advised that RN will schedule that appt.   1. LOCATION and RADIATION: Where is the pain located?      Right knee pain 2. QUALITY: What does the pain feel like?  (e.g., sharp, dull, aching, burning)     sharp 3. SEVERITY: How bad is the pain? What does it keep you from doing?   (Scale 1-10; or mild, moderate, severe)     8/10 4. ONSET: When did the pain start? Does it come and go, or is it there all the time?     One week ago 5. RECURRENT: Have you had this pain before? If Yes, ask: When, and what happened then?      Has not had knee pain like this before 6. SETTING: Has there been any recent work, exercise or other activity that involved that part of the body?      N/a 7. AGGRAVATING FACTORS: What makes the knee pain worse? (e.g., walking, climbing stairs, running)    Hurts more when walking and sometimes locks when she is trying to sit.  Difficult to get out of chair.  She is walking with walker 8. ASSOCIATED SYMPTOMS: Is there any swelling or redness of the knee?     denies 9. OTHER SYMPTOMS: Do you have any other symptoms? (e.g., calf pain, chest pain, difficulty breathing, fever)     denies  Answer Assessment - Initial Assessment Questions Also thinks that there may be a hernia or mass in her abdomen affecting her back pain  1. ONSET: When did the pain begin? (e.g., minutes, hours, days)     Quite a while, does not know for sure how long 2. LOCATION: Where does it hurt? (upper, mid or lower back)     Right low back, hip 3. SEVERITY: How bad is the pain?  (e.g., Scale 1-10; mild, moderate, or severe)     moderate 4. PATTERN: Is the pain constant? (e.g., yes, no; constant, intermittent)      No pain with sitting, unable to stand for more than about 15 minutes at a time  6. CAUSE:  What do you think is  causing the back pain?      unsure 7. BACK OVERUSE:  Any recent lifting of heavy objects, strenuous work or exercise?     deneis 8. MEDICINES: What have you taken so far for the pain? (e.g., nothing, acetaminophen , NSAIDS)     Ibuprofen, pain patch, icy hot  Protocols used: Knee Pain-A-AH, Back Pain-A-AH

## 2023-11-28 ENCOUNTER — Encounter: Payer: Self-pay | Admitting: Family Medicine

## 2023-11-28 ENCOUNTER — Ambulatory Visit (INDEPENDENT_AMBULATORY_CARE_PROVIDER_SITE_OTHER): Admitting: Family Medicine

## 2023-11-28 VITALS — BP 139/72 | HR 88 | Ht 65.0 in | Wt 225.0 lb

## 2023-11-28 DIAGNOSIS — M1711 Unilateral primary osteoarthritis, right knee: Secondary | ICD-10-CM

## 2023-11-28 MED ORDER — METHYLPREDNISOLONE ACETATE 80 MG/ML IJ SUSP
80.0000 mg | Freq: Once | INTRAMUSCULAR | Status: AC
  Administered 2023-11-28: 80 mg via INTRA_ARTICULAR

## 2023-11-28 NOTE — Progress Notes (Signed)
 BP 139/72   Pulse 88   Ht 5' 5 (1.651 m)   Wt 225 lb (102.1 kg)   SpO2 95%   BMI 37.44 kg/m    Subjective:   Patient ID: Anna Bradford, female    DOB: 1933-12-08, 89 y.o.   MRN: [id number]  HPI: Anna Bradford is a 89 y.o. female presenting on 11/28/2023 for Knee Pain (right), Abdominal Pain (Right sided), and Dysphagia (Has nodule right neck that causes her to choke)   HPI Discussed the use of AI scribe software for clinical note transcription with the patient, who gave verbal consent to proceed.  History of Present Illness   Anna Bradford is an 89 year old female who presents with right knee pain.  Right knee pain - Onset Friday night with sudden onset - Pain has persisted throughout the week - Pain was absent this morning but recurred after walking down the hall - Intermittent pain with periods of relief, recurs with activity such as walking - Pain may be exacerbated by increased walking and changes in the weather - History of receiving cortisone injections in the knee  Hip pain - History of hip pain treated with injections for bursitis - Initial hip injection provided relief for six months - Subsequent hip injections provided relief for three months  Back pain - Difficulty standing for extended periods due to back pain       Relevant past medical, surgical, family and social history reviewed and updated as indicated. Interim medical history since our last visit reviewed. Allergies and medications reviewed and updated.  Review of Systems  Constitutional:  Negative for chills and fever.  Eyes:  Negative for visual disturbance.  Respiratory:  Negative for chest tightness and shortness of breath.   Cardiovascular:  Negative for chest pain and leg swelling.  Musculoskeletal:  Positive for arthralgias, back pain, gait problem and joint swelling.  Skin:  Negative for rash.  Neurological:  Negative for light-headedness and headaches.  Psychiatric/Behavioral:  Negative for  agitation and behavioral problems.   All other systems reviewed and are negative.   Per HPI unless specifically indicated above   Allergies as of 11/28/2023       Reactions   Levofloxacin    Other (See Comments) tendonitis   Nitrofurantoin    Other reaction(s): Other (See Comments) Bleeding thru bowels   Macrolides And Ketolides    Bleeding thru bowels   Codeine Nausea And Vomiting   Sulfa Antibiotics Nausea And Vomiting        Medication List        Accurate as of November 28, 2023  9:53 AM. If you have any questions, ask your nurse or doctor.          acetaminophen  325 MG tablet Commonly known as: TYLENOL  Take 650 mg by mouth daily with lunch.   amitriptyline  50 MG tablet Commonly known as: ELAVIL  TAKE 1 TABLET BY MOUTH AT  BEDTIME   amLODipine  10 MG tablet Commonly known as: NORVASC  Take 1 tablet (10 mg total) by mouth daily.   baclofen  10 MG tablet Commonly known as: LIORESAL  Take 1 tablet (10 mg total) by mouth at bedtime as needed for muscle spasms.   clobetasol  cream 0.05 % Commonly known as: TEMOVATE  Apply topically 2 (two) times daily.   diphenhydrAMINE 25 MG tablet Commonly known as: BENADRYL Take 25 mg by mouth daily as needed for allergies.   famotidine  20 MG tablet Commonly known as: Pepcid  Take 1 tablet (  20 mg total) by mouth 2 (two) times daily.   furosemide  20 MG tablet Commonly known as: LASIX  Take 1 tablet (20 mg total) by mouth daily.   losartan -hydrochlorothiazide  100-25 MG tablet Commonly known as: HYZAAR Take 1 tablet by mouth daily.   magnesium hydroxide 400 MG/5ML suspension Commonly known as: MILK OF MAGNESIA Take 15 mLs by mouth every other day.   methocarbamol 500 MG tablet Commonly known as: ROBAXIN Take 500 mg by mouth 4 (four) times daily.   multivitamin tablet Take 1 tablet by mouth daily.   nystatin cream Commonly known as: MYCOSTATIN Apply 1 application  topically daily as needed for dry skin.    Potassium 99 MG Tabs Take 99 mg by mouth daily.   rOPINIRole  2 MG tablet Commonly known as: REQUIP  TAKE ONE TABLET AT BEDTIME FOR LEG CRAMPS   SYSTANE OP Place 1 drop into both eyes daily as needed (dry eyes).   Trelegy Ellipta  100-62.5-25 MCG/ACT Aepb Generic drug: Fluticasone-Umeclidin-Vilant Inhale 1 puff into the lungs daily.   vitamin C 1000 MG tablet Take 1,000 mg by mouth daily.   Vitamin D -3 25 MCG (1000 UT) Caps Take 2,000 Units by mouth daily.         Objective:   BP 139/72   Pulse 88   Ht 5' 5 (1.651 m)   Wt 225 lb (102.1 kg)   SpO2 95%   BMI 37.44 kg/m   Wt Readings from Last 3 Encounters:  11/28/23 225 lb (102.1 kg)  07/21/23 224 lb (101.6 kg)  06/30/23 228 lb (103.4 kg)    Physical Exam Vitals and nursing note reviewed.  Constitutional:      Appearance: She is well-developed. She is obese.  Musculoskeletal:     Right knee: Effusion and crepitus present. No erythema or ecchymosis. Normal range of motion. Tenderness present over the medial joint line. No LCL laxity, MCL laxity, ACL laxity or PCL laxity. Normal meniscus.     Instability Tests: Anterior drawer test negative. Posterior drawer test negative. Anterior Lachman test negative. Medial McMurray test negative and lateral McMurray test negative.  Neurological:     Mental Status: She is alert.     Knee injection: Consent form signed. Risk factors of bleeding and infection discussed with patient and patient is agreeable towards injection. Patient prepped with Betadine . Lateral approach towards injection used. Injected 80 mg of Depo-Medrol  and 1 mL of 2% lidocaine . Patient tolerated procedure well and no side effects from noted. Minimal to no bleeding. Simple bandage applied after.   Assessment & Plan:   Problem List Items Addressed This Visit       Musculoskeletal and Integument   Osteoarthritis - Primary   Relevant Medications   methylPREDNISolone  acetate (DEPO-MEDROL ) injection 80 mg  (Start on 11/28/2023 10:00 AM)     Follow up plan: Return if symptoms worsen or fail to improve.  Counseling provided for all of the vaccine components No orders of the defined types were placed in this encounter.   Fonda Levins, MD [facility] 11/28/2023, 9:53 AM

## 2023-12-13 ENCOUNTER — Other Ambulatory Visit (INDEPENDENT_AMBULATORY_CARE_PROVIDER_SITE_OTHER): Payer: Self-pay

## 2023-12-13 ENCOUNTER — Ambulatory Visit: Admitting: Orthopedic Surgery

## 2023-12-13 DIAGNOSIS — M25551 Pain in right hip: Secondary | ICD-10-CM | POA: Diagnosis not present

## 2023-12-13 DIAGNOSIS — M545 Low back pain, unspecified: Secondary | ICD-10-CM | POA: Diagnosis not present

## 2023-12-13 MED ORDER — PREDNISONE 10 MG (21) PO TBPK: ORAL_TABLET | ORAL | 0 refills | Status: DC

## 2023-12-14 ENCOUNTER — Encounter: Payer: Self-pay | Admitting: Orthopedic Surgery

## 2023-12-14 NOTE — Progress Notes (Signed)
 New Patient Visit  Assessment: Anna Bradford is a 89 y.o. female with the following: 1.  Chronic low back pain 2.  Right lateral hip pain   Plan: Anna Bradford is complaining of pain in the lower back.  Pain gets worse with standing.  She is limited to standing no longer than 10-15 minutes.  She reports the pain is primarily across the lower back, with radiating pain distally.  She does have some tenderness over the greater trochanter on the right side.  I am not convinced that an injection for the greater trochanter on the right would improve the lower back symptoms.  This was discussed with the patient.  We can consider an injection in the future.  However at this point, I am recommending a prednisone  Dosepak.  She will contact the clinic if she has further issues.  Follow-up: Return if symptoms worsen or fail to improve.  Subjective:  Chief Complaint  Patient presents with   Hip Pain    Right hip pain. Hurts more in the lower back- when sits down no pain, when stands up it hurts.    History of Present Illness: Anna Bradford is a 89 y.o. female who presents for evaluation of low back pain.  She has seen Dr. Brenna in the past.  She states that she has had pain in the lower back for the past few months.  No specific injury.  This is not consistent with her usual pain.  She states that she has no discomfort when she sits.  However, when she stands up, she has pain all the way across the lower back.  Pain gets worse after standing for 10-15 minutes.  She is not currently taking medicines.   Review of Systems: No fevers or chills No numbness or tingling No chest pain No shortness of breath No bowel or bladder dysfunction No GI distress No headaches   Medical History:  Past Medical History:  Diagnosis Date   Arthritis    Cancer (HCC) 1996   left breast   Essential hypertension, benign    GERD (gastroesophageal reflux disease)    History of kidney stones     Past Surgical  History:  Procedure Laterality Date   ABDOMINAL HYSTERECTOMY     Absess Off Intestine     cataract surgery Left 08/10/2011   cataract surgery Right 11/01/2011   CHOLECYSTECTOMY     1972   COLONOSCOPY N/A 02/06/2015   Procedure: COLONOSCOPY;  Surgeon: Claudis RAYMOND Rivet, MD;  Location: [facility];  Service: Endoscopy;  Laterality: N/A;  1015   FRACTURE SURGERY  1990   Right ankle ORIF   GALLBLADDER SURGERY     Rupture Lower Stomach   HERNIA REPAIR     Hysterectomy ---- unknown     1980   Lapoma Tumor     Left Breast Masectomy     1986   MASS EXCISION N/A 01/30/2018   Procedure: EXCISION MASS 3CM ON BACK AND 3CM ON ABDOMEN;  Surgeon: Kallie Manuelita BROCKS, MD;  Location: [facility];  Service: General;  Laterality: N/A;   Right Ankle Surgery     TOTAL KNEE ARTHROPLASTY Left 09/09/2021   Procedure: TOTAL KNEE ARTHROPLASTY;  Surgeon: Rubie Kemps, MD;  Location: [facility];  Service: Orthopedics;  Laterality: Left;    Family History  Problem Relation Age of Onset   Cancer Mother    Coronary artery disease Mother    Heart disease Sister    Social History  Tobacco Use   Smoking status: Never   Smokeless tobacco: Never  Vaping Use   Vaping status: Never Used  Substance Use Topics   Alcohol use: No   Drug use: No    Allergies  Allergen Reactions   Levofloxacin     Other (See Comments) tendonitis   Nitrofurantoin     Other reaction(s): Other (See Comments) Bleeding thru bowels   Macrolides And Ketolides     Bleeding thru bowels   Codeine Nausea And Vomiting   Sulfa Antibiotics Nausea And Vomiting    Current Meds  Medication Sig   acetaminophen  (TYLENOL ) 325 MG tablet Take 650 mg by mouth daily with lunch.   amitriptyline  (ELAVIL ) 50 MG tablet TAKE 1 TABLET BY MOUTH AT  BEDTIME   amLODipine  (NORVASC ) 10 MG tablet Take 1 tablet (10 mg total) by mouth daily.   Ascorbic Acid (VITAMIN C) 1000 MG tablet Take 1,000 mg by mouth daily.    baclofen  (LIORESAL ) 10 MG tablet Take 1  tablet (10 mg total) by mouth at bedtime as needed for muscle spasms.   Cholecalciferol (VITAMIN D -3) 1000 UNITS CAPS Take 2,000 Units by mouth daily.   clobetasol  cream (TEMOVATE ) 0.05 % Apply topically 2 (two) times daily.   diphenhydrAMINE (BENADRYL) 25 MG tablet Take 25 mg by mouth daily as needed for allergies.   famotidine  (PEPCID ) 20 MG tablet Take 1 tablet (20 mg total) by mouth 2 (two) times daily.   Fluticasone-Umeclidin-Vilant (TRELEGY ELLIPTA ) 100-62.5-25 MCG/ACT AEPB Inhale 1 puff into the lungs daily.   furosemide  (LASIX ) 20 MG tablet Take 1 tablet (20 mg total) by mouth daily.   losartan -hydrochlorothiazide  (HYZAAR) 100-25 MG tablet Take 1 tablet by mouth daily.   magnesium hydroxide (MILK OF MAGNESIA) 400 MG/5ML suspension Take 15 mLs by mouth every other day.   methocarbamol (ROBAXIN) 500 MG tablet Take 500 mg by mouth 4 (four) times daily.   Multiple Vitamin (MULTIVITAMIN) tablet Take 1 tablet by mouth daily.   nystatin cream (MYCOSTATIN) Apply 1 application  topically daily as needed for dry skin.   Polyethyl Glycol-Propyl Glycol (SYSTANE OP) Place 1 drop into both eyes daily as needed (dry eyes).   Potassium 99 MG TABS Take 99 mg by mouth daily.   predniSONE  (STERAPRED UNI-PAK 21 TAB) 10 MG (21) TBPK tablet 10 mg DS 12 as directed   rOPINIRole  (REQUIP ) 2 MG tablet TAKE ONE TABLET AT BEDTIME FOR LEG CRAMPS    Objective: There were no vitals taken for this visit.  Physical Exam:  General: Alert and oriented. and No acute distress. Gait: Slow, steady gait.  Low back without deformity.  Diffuse tenderness across the lower back.  Negative straight leg raise bilaterally.  She does have tenderness over the greater trochanter on the right.  Toes are warm and well-perfused.  IMAGING: I personally ordered and reviewed the following images  X-rays of the right hip were obtained in clinic today.  No acute injuries noted.  Mild to moderate loss of joint space within the right  hip joint.  No significant osteophytes.  No subchondral cysts.  No evidence of dysplasia.  Impression: Right hip x-rays with mild to moderate loss of joint space.   Lumbar spine x-rays were previously obtained.  No anterolisthesis.  No acute injuries.   New Medications:  Meds ordered this encounter  Medications   predniSONE  (STERAPRED UNI-PAK 21 TAB) 10 MG (21) TBPK tablet    Sig: 10 mg DS 12 as directed    Dispense:  48 tablet    Refill:  0      Oneil DELENA Horde, MD  12/14/2023 3:19 PM

## 2023-12-15 ENCOUNTER — Other Ambulatory Visit: Payer: Self-pay | Admitting: Family Medicine

## 2023-12-15 DIAGNOSIS — M545 Low back pain, unspecified: Secondary | ICD-10-CM

## 2023-12-15 DIAGNOSIS — M51379 Other intervertebral disc degeneration, lumbosacral region without mention of lumbar back pain or lower extremity pain: Secondary | ICD-10-CM

## 2023-12-19 ENCOUNTER — Telehealth: Payer: Self-pay | Admitting: Family Medicine

## 2023-12-19 NOTE — Telephone Encounter (Signed)
 Pt made aware of Dr. Williemae recommendations. Pt states that she seen the back doctor and was told that the pain in her abdomen is not coming from her back. Pt states that she is going to go to the ER so she can get a scan of her abdomen.

## 2023-12-19 NOTE — Telephone Encounter (Signed)
 I believe the pt has been seen for this. Ok to order or do you want to evaluate?

## 2023-12-19 NOTE — Telephone Encounter (Signed)
 Copied from CRM #[id number]. Topic: Clinical - Request for Lab/Test Order >> Dec 19, 2023 10:47 AM Anna Bradford wrote: Reason for CRM: Patient is requesting provider to order her an ultrasound for the pain in her naval that spreads through to her back. Patient feels like there is something there. Patient can be contact at [phone]

## 2023-12-19 NOTE — Telephone Encounter (Signed)
 We have seen and discussed this, I do not think the pain has anything to do with her abdomen based on all previous discussions.  We believe that the pain is coming from her back and the nerve impingement, she has had x-rays and MRIs of her back that showed bulging disc and issues with that.  I think she needs to discuss it with her back doctor.  I do not believe that an ultrasound of her abdomen will do anything for us  as far as diagnosing her problem.

## 2023-12-21 ENCOUNTER — Other Ambulatory Visit: Payer: Self-pay

## 2023-12-21 ENCOUNTER — Emergency Department (HOSPITAL_COMMUNITY)

## 2023-12-21 ENCOUNTER — Encounter (HOSPITAL_COMMUNITY): Payer: Self-pay

## 2023-12-21 ENCOUNTER — Emergency Department (HOSPITAL_COMMUNITY)
Admission: EM | Admit: 2023-12-21 | Discharge: 2023-12-21 | Disposition: A | Discharge status: Home / Self Care | Attending: Emergency Medicine | Admitting: Emergency Medicine

## 2023-12-21 DIAGNOSIS — M545 Low back pain, unspecified: Secondary | ICD-10-CM | POA: Diagnosis present

## 2023-12-21 DIAGNOSIS — R109 Unspecified abdominal pain: Secondary | ICD-10-CM | POA: Diagnosis not present

## 2023-12-21 DIAGNOSIS — K219 Gastro-esophageal reflux disease without esophagitis: Secondary | ICD-10-CM | POA: Insufficient documentation

## 2023-12-21 LAB — COMPREHENSIVE METABOLIC PANEL WITH GFR
ALT: 23 U/L (ref 0–44)
AST: 24 U/L (ref 15–41)
Albumin: 4 g/dL (ref 3.5–5.0)
Alkaline Phosphatase: 101 U/L (ref 38–126)
Anion gap: 14 (ref 5–15)
BUN: 16 mg/dL (ref 8–23)
CO2: 27 mmol/L (ref 22–32)
Calcium: 9 mg/dL (ref 8.9–10.3)
Chloride: 99 mmol/L (ref 98–111)
Creatinine, Ser: 0.77 mg/dL (ref 0.44–1.00)
GFR, Estimated: >60 mL/min (ref >60)
Glucose, Bld: 140 mg/dL — ABNORMAL HIGH (ref 70–99)
Potassium: 3.7 mmol/L (ref 3.5–5.1)
Sodium: 140 mmol/L (ref 135–145)
Total Bilirubin: 0.5 mg/dL (ref 0.0–1.2)
Total Protein: 6.9 g/dL (ref 6.5–8.1)

## 2023-12-21 LAB — LIPASE, BLOOD: Lipase: 35 U/L (ref 11–51)

## 2023-12-21 LAB — CBC
HCT: 44.1 % (ref 36.0–46.0)
Hemoglobin: 14.6 g/dL (ref 12.0–15.0)
MCH: 30 pg (ref 26.0–34.0)
MCHC: 33.1 g/dL (ref 30.0–36.0)
MCV: 90.6 fL (ref 80.0–100.0)
Platelets: 289 K/uL (ref 150–400)
RBC: 4.87 MIL/uL (ref 3.87–5.11)
RDW: 14.1 % (ref 11.5–15.5)
WBC: 11.6 K/uL — ABNORMAL HIGH (ref 4.0–10.5)
nRBC: 0 % (ref 0.0–0.2)

## 2023-12-21 LAB — URINALYSIS, ROUTINE W REFLEX MICROSCOPIC
Bilirubin Urine: NEGATIVE
Glucose, UA: NEGATIVE mg/dL
Hgb urine dipstick: NEGATIVE
Ketones, ur: NEGATIVE mg/dL
Leukocytes,Ua: NEGATIVE
Nitrite: NEGATIVE
Protein, ur: NEGATIVE mg/dL
Specific Gravity, Urine: 1.01 (ref 1.005–1.030)
pH: 7 (ref 5.0–8.0)

## 2023-12-21 MED ORDER — LIDOCAINE 5 % EX PTCH: 1.0000 | MEDICATED_PATCH | CUTANEOUS | 0 refills | Status: AC

## 2023-12-21 MED ORDER — HYDROCODONE-ACETAMINOPHEN 5-325 MG PO TABS
1.0000 | ORAL_TABLET | Freq: Once | ORAL | Status: AC
  Administered 2023-12-21: 1 via ORAL
  Filled 2023-12-21: qty 1

## 2023-12-21 MED ORDER — IOHEXOL 300 MG/ML  SOLN
100.0000 mL | Freq: Once | INTRAMUSCULAR | Status: AC | PRN
  Administered 2023-12-21: 100 mL via INTRAVENOUS

## 2023-12-21 NOTE — ED Provider Notes (Signed)
 [place] [facility] AT [facility] Provider Note   CSN: [id number] Arrival date & time: 12/21/23  1513     Patient presents with: Back Pain   Anna Bradford is a 89 y.o. female.  He has history of GERD, varicose veins, osteoporosis, obesity,, degenerative disc disease at L5-S1.  Presents ER for 1 year of right sided abdominal and low back pain has been constant.  Denies radiation, denies numbness or tingling, denies weakness, no trouble walking, no bowel or bladder incontinence.  She states she is just tired of being in pain and feels like there is something in her abdomen that is pushing on her organs and wants to be evaluated.  {Add pertinent medical, surgical, social history, OB history to HPI:32947}  Back Pain      Prior to Admission medications   Medication Sig Start Date End Date Taking? Authorizing Provider  acetaminophen  (TYLENOL ) 325 MG tablet Take 650 mg by mouth daily with lunch.    [provider]  amitriptyline  (ELAVIL ) 50 MG tablet TAKE 1 TABLET BY MOUTH AT  BEDTIME 12/19/23   Dettinger, Fonda LABOR, MD  amLODipine  (NORVASC ) 10 MG tablet Take 1 tablet (10 mg total) by mouth daily. 01/21/23   Dettinger, Fonda LABOR, MD  Ascorbic Acid (VITAMIN C) 1000 MG tablet Take 1,000 mg by mouth daily.     [provider]  baclofen  (LIORESAL ) 10 MG tablet Take 1 tablet (10 mg total) by mouth at bedtime as needed for muscle spasms. 01/21/23   Dettinger, Fonda LABOR, MD  Cholecalciferol (VITAMIN D -3) 1000 UNITS CAPS Take 2,000 Units by mouth daily.    [provider]  clobetasol  cream (TEMOVATE ) 0.05 % Apply topically 2 (two) times daily. 01/24/20   Dettinger, Fonda LABOR, MD  diphenhydrAMINE (BENADRYL) 25 MG tablet Take 25 mg by mouth daily as needed for allergies.    [provider]  famotidine  (PEPCID ) 20 MG tablet Take 1 tablet (20 mg total) by mouth 2 (two) times daily. 01/21/23   Dettinger, Fonda LABOR, MD  Fluticasone-Umeclidin-Vilant (TRELEGY  ELLIPTA) 100-62.5-25 MCG/ACT AEPB Inhale 1 puff into the lungs daily. 05/20/23   Dettinger, Fonda LABOR, MD  furosemide  (LASIX ) 20 MG tablet Take 1 tablet (20 mg total) by mouth daily. 05/26/23   Gladis Mustard, FNP  losartan -hydrochlorothiazide  (HYZAAR) 100-25 MG tablet Take 1 tablet by mouth daily. 01/21/23   Dettinger, Fonda LABOR, MD  magnesium hydroxide (MILK OF MAGNESIA) 400 MG/5ML suspension Take 15 mLs by mouth every other day.    [provider]  methocarbamol (ROBAXIN) 500 MG tablet Take 500 mg by mouth 4 (four) times daily. 09/17/21   [provider]  Multiple Vitamin (MULTIVITAMIN) tablet Take 1 tablet by mouth daily.    [provider]  nystatin cream (MYCOSTATIN) Apply 1 application  topically daily as needed for dry skin.    [provider]  Polyethyl Glycol-Propyl Glycol (SYSTANE OP) Place 1 drop into both eyes daily as needed (dry eyes).    [provider]  Potassium 99 MG TABS Take 99 mg by mouth daily.    [provider]  predniSONE  (STERAPRED UNI-PAK 21 TAB) 10 MG (21) TBPK tablet 10 mg DS 12 as directed 12/13/23   Onesimo Oneil LABOR, MD  rOPINIRole  (REQUIP ) 2 MG tablet TAKE ONE TABLET AT BEDTIME FOR LEG CRAMPS 10/08/21   Dettinger, Fonda LABOR, MD    Allergies: Levofloxacin, Nitrofurantoin, Macrolides and ketolides, Codeine, and Sulfa antibiotics    Review of Systems  Musculoskeletal:  Positive for back pain.    Updated Vital Signs BP (!) 165/86   Pulse 99   Temp 98.4 F (36.9 C) (Oral)   Resp 18   Ht 5' 5 (1.651 m)   Wt 102.1 kg   SpO2 96%   BMI 37.46 kg/m   Physical Exam Vitals and nursing note reviewed.  Constitutional:      General: She is not in acute distress.    Appearance: She is well-developed.  HENT:     Head: Normocephalic and atraumatic.     Mouth/Throat:     Mouth: Mucous membranes are moist.  Eyes:     Conjunctiva/sclera: Conjunctivae normal.  Cardiovascular:     Rate and Rhythm: Normal rate and  regular rhythm.     Heart sounds: No murmur heard. Pulmonary:     Effort: Pulmonary effort is normal. No respiratory distress.     Breath sounds: Normal breath sounds.  Abdominal:     Palpations: Abdomen is soft.     Tenderness: There is no abdominal tenderness.  Musculoskeletal:        General: No swelling.     Cervical back: Neck supple.  Skin:    General: Skin is warm and dry.     Capillary Refill: Capillary refill takes less than 2 seconds.  Neurological:     General: No focal deficit present.     Mental Status: She is alert and oriented to person, place, and time.  Psychiatric:        Mood and Affect: Mood normal.     (all labs ordered are listed, but only abnormal results are displayed) Labs Reviewed  COMPREHENSIVE METABOLIC PANEL WITH GFR - Abnormal; Notable for the following components:      Result Value   Glucose, Bld 140 (*)    All other components within normal limits  CBC - Abnormal; Notable for the following components:   WBC 11.6 (*)    All other components within normal limits  LIPASE, BLOOD  URINALYSIS, ROUTINE W REFLEX MICROSCOPIC    EKG: None  Radiology: No results found.  {Document cardiac monitor, telemetry assessment procedure when appropriate:32947} Procedures   Medications Ordered in the ED - No data to display    {Click here for ABCD2, HEART and other calculators REFRESH Note before signing:1}                              Medical Decision Making Amount and/or Complexity of Data Reviewed Labs: ordered.   ***  {Document critical care time when appropriate  Document review of labs and clinical decision tools ie CHADS2VASC2, etc  Document your independent review of radiology images and any outside records  Document your discussion with family members, caretakers and with consultants  Document social determinants of health affecting pt's care  Document your decision making why or why not admission, treatments were needed:32947:::1}    Final diagnoses:  None    ED Discharge Orders     None

## 2023-12-21 NOTE — Telephone Encounter (Signed)
 Okay we will watch for the results.

## 2023-12-21 NOTE — ED Notes (Signed)
 Pt back from CT

## 2023-12-21 NOTE — ED Notes (Signed)
 ED Provider at bedside.

## 2023-12-21 NOTE — ED Notes (Signed)
 Pt returned to Triage room to provide urine specimen and wanted to add to her complaint that she is also experiencing a swollen gland in her right throat that has been intermittent fo rthe past year as well.

## 2023-12-21 NOTE — Discharge Instructions (Addendum)
 Your blood work today was normal, your CT scan of your abdomen showed that you had a prior gallbladder removal, your ducts are slightly dilated, but your liver enzymes are normal.  Follow-up close with your PCP.  The CT scan of your spine did show significant degenerative changes but this is likely the cause of your pain.  Please follow-up with a spine doctor since the steroids have not been helpful for you.  I have prescribed lidocaine  patches to help with your pain as well.  Come back for new or worsening symptoms.

## 2023-12-21 NOTE — ED Triage Notes (Signed)
 Pt arrived via POV c/o abdominal pain that shoots to her back. Pt reports pain has been present for over the past year and reports trying to get help from her doctor but has not received any relief. Pt reports she is just tired of hurting. Pt reports she has also started developing incontinence.

## 2023-12-21 NOTE — ED Notes (Signed)
 Patient transported to CT

## 2023-12-23 ENCOUNTER — Ambulatory Visit: Payer: Medicare Other

## 2023-12-23 VITALS — BP 139/72 | HR 88 | Ht 65.0 in | Wt 225.0 lb

## 2023-12-23 DIAGNOSIS — Z0000 Encounter for general adult medical examination without abnormal findings: Secondary | ICD-10-CM

## 2023-12-23 NOTE — Progress Notes (Signed)
 Chief Complaint  Patient presents with   Medicare Wellness     Subjective:   Anna Bradford is a 89 y.o. female who presents for a Medicare Annual Wellness Visit.  Visit info / Clinical Intake: Medicare Wellness Visit Type:: Subsequent Annual Wellness Visit Persons participating in visit and providing information:: patient Medicare Wellness Visit Mode:: Telephone If telephone:: video declined Since this visit was completed virtually, some vitals may be partially provided or unavailable. Missing vitals are due to the limitations of the virtual format.: Documented vitals are patient reported If Telephone or Video please confirm:: I connected with patient using audio/video enable telemedicine. I verified patient identity with two identifiers, discussed telehealth limitations, and patient agreed to proceed. Patient Location:: home Provider Location:: home office Interpreter Needed?: No Pre-visit prep was completed: yes AWV questionnaire completed by patient prior to visit?: no Living arrangements:: (!) lives alone Patient's Overall Health Status Rating: very good Typical amount of pain: none Does pain affect daily life?: no Are you currently prescribed opioids?: no  Dietary Habits and Nutritional Risks How many meals a day?: 3 Eats fruit and vegetables daily?: yes Most meals are obtained by: preparing own meals Diabetic:: no  Functional Status Activities of Daily Living (to include ambulation/medication): Independent Ambulation: Independent Medication Administration: Independent Home Management (perform basic housework or laundry): Independent Manage your own finances?: yes Primary transportation is: driving Concerns about hearing?: no  Fall Screening Falls in the past year?: 1 Number of falls in past year: 0 Was there an injury with Fall?: 1 Fall Risk Category Calculator: 2 Patient Fall Risk Level: Moderate Fall Risk  Fall Risk Patient at Risk for Falls Due to:  Impaired balance/gait; Impaired mobility Fall risk Follow up: Falls evaluation completed; Education provided  Home and Transportation Safety: All rugs have non-skid backing?: yes All stairs or steps have railings?: yes Grab bars in the bathtub or shower?: yes Have non-skid surface in bathtub or shower?: yes Good home lighting?: yes Regular seat belt use?: yes Hospital stays in the last year:: no  Cognitive Assessment Difficulty concentrating, remembering, or making decisions? : yes Will 6CIT or Mini Cog be Completed: yes What year is it?: 0 points What month is it?: 0 points Give patient an address phrase to remember (5 components): [address] [place], [place] About what time is it?: 0 points Count backwards from 20 to 1: 0 points Say the months of the year in reverse: 0 points Repeat the address phrase from earlier: 4 points 6 CIT Score: 4 points  Advance Directives (For Healthcare) Does Patient Have a Medical Advance Directive?: No Would patient like information on creating a medical advance directive?: No - Patient declined  Reviewed/Updated  Reviewed/Updated: Reviewed All (Medical, Surgical, Family, Medications, Allergies, Care Teams, Patient Goals); Medical History; Surgical History; Family History; Medications; Allergies; Care Teams; Patient Goals    Allergies (verified) Levofloxacin, Nitrofurantoin, Macrolides and ketolides, Codeine, and Sulfa antibiotics   Current Medications (verified) Outpatient Encounter Medications as of 12/23/2023  Medication Sig   acetaminophen  (TYLENOL ) 325 MG tablet Take 650 mg by mouth daily with lunch.   amitriptyline  (ELAVIL ) 50 MG tablet TAKE 1 TABLET BY MOUTH AT  BEDTIME   amLODipine  (NORVASC ) 10 MG tablet Take 1 tablet (10 mg total) by mouth daily.   Ascorbic Acid (VITAMIN C) 1000 MG tablet Take 1,000 mg by mouth daily.    baclofen  (LIORESAL ) 10 MG tablet Take 1 tablet (10 mg total) by mouth at bedtime as needed for muscle spasms.  Cholecalciferol (VITAMIN D -3) 1000 UNITS CAPS Take 2,000 Units by mouth daily.   clobetasol  cream (TEMOVATE ) 0.05 % Apply topically 2 (two) times daily.   diphenhydrAMINE (BENADRYL) 25 MG tablet Take 25 mg by mouth daily as needed for allergies.   famotidine  (PEPCID ) 20 MG tablet Take 1 tablet (20 mg total) by mouth 2 (two) times daily.   Fluticasone-Umeclidin-Vilant (TRELEGY ELLIPTA ) 100-62.5-25 MCG/ACT AEPB Inhale 1 puff into the lungs daily.   furosemide  (LASIX ) 20 MG tablet Take 1 tablet (20 mg total) by mouth daily.   lidocaine  (LIDODERM ) 5 % Place 1 patch onto the skin daily. Remove & Discard patch within 12 hours or as directed by MD   losartan -hydrochlorothiazide  (HYZAAR) 100-25 MG tablet Take 1 tablet by mouth daily.   magnesium hydroxide (MILK OF MAGNESIA) 400 MG/5ML suspension Take 15 mLs by mouth every other day.   methocarbamol (ROBAXIN) 500 MG tablet Take 500 mg by mouth 4 (four) times daily.   Multiple Vitamin (MULTIVITAMIN) tablet Take 1 tablet by mouth daily.   nystatin cream (MYCOSTATIN) Apply 1 application  topically daily as needed for dry skin.   Polyethyl Glycol-Propyl Glycol (SYSTANE OP) Place 1 drop into both eyes daily as needed (dry eyes).   Potassium 99 MG TABS Take 99 mg by mouth daily.   predniSONE  (STERAPRED UNI-PAK 21 TAB) 10 MG (21) TBPK tablet 10 mg DS 12 as directed   rOPINIRole  (REQUIP ) 2 MG tablet TAKE ONE TABLET AT BEDTIME FOR LEG CRAMPS   No facility-administered encounter medications on file as of 12/23/2023.    History: Past Medical History:  Diagnosis Date   Arthritis    Cancer (HCC) 1996   left breast   Essential hypertension, benign    GERD (gastroesophageal reflux disease)    History of kidney stones    Past Surgical History:  Procedure Laterality Date   ABDOMINAL HYSTERECTOMY     Absess Off Intestine     cataract surgery Left 08/10/2011   cataract surgery Right 11/01/2011   CHOLECYSTECTOMY     1972   COLONOSCOPY N/A 02/06/2015    Procedure: COLONOSCOPY;  Surgeon: Claudis RAYMOND Rivet, MD;  Location: [facility];  Service: Endoscopy;  Laterality: N/A;  1015   FRACTURE SURGERY  1990   Right ankle ORIF   GALLBLADDER SURGERY     Rupture Lower Stomach   HERNIA REPAIR     Hysterectomy ---- unknown     1980   Lapoma Tumor     Left Breast Masectomy     1986   MASS EXCISION N/A 01/30/2018   Procedure: EXCISION MASS 3CM ON BACK AND 3CM ON ABDOMEN;  Surgeon: Kallie Manuelita BROCKS, MD;  Location: [facility];  Service: General;  Laterality: N/A;   Right Ankle Surgery     TOTAL KNEE ARTHROPLASTY Left 09/09/2021   Procedure: TOTAL KNEE ARTHROPLASTY;  Surgeon: Rubie Kemps, MD;  Location: [facility];  Service: Orthopedics;  Laterality: Left;   Family History  Problem Relation Age of Onset   Cancer Mother    Coronary artery disease Mother    Heart disease Sister    Social History   Occupational History   Occupation: retired  Tobacco Use   Smoking status: Never   Smokeless tobacco: Never  Vaping Use   Vaping status: Never Used  Substance and Sexual Activity   Alcohol use: No   Drug use: No   Sexual activity: Not Currently    Birth control/protection: Surgical   Tobacco Counseling Counseling given: Yes  SDOH Screenings   Food Insecurity: No Food Insecurity (12/23/2023)  Housing: Unknown (12/23/2023)  Transportation Needs: No Transportation Needs (12/23/2023)  Utilities: Not At Risk (12/23/2023)  Alcohol Screen: Low Risk  (12/22/2022)  Depression (PHQ2-9): Low Risk  (12/23/2023)  Financial Resource Strain: Low Risk  (12/22/2022)  Physical Activity: Inactive (12/23/2023)  Social Connections: Moderately Isolated (12/23/2023)  Stress: No Stress Concern Present (12/23/2023)  Tobacco Use: Low Risk  (12/23/2023)  Health Literacy: Adequate Health Literacy (12/23/2023)   See flowsheets for full screening details  Depression Screen Depression Screening Exception Documentation Depression Screening Exception:: Patient refusal  PHQ 2 & 9  Depression Scale- Over the past 2 weeks, how often have you been bothered by any of the following problems? Little interest or pleasure in doing things: 0 Feeling down, depressed, or hopeless (PHQ Adolescent also includes...irritable): 0 PHQ-2 Total Score: 0 Trouble falling or staying asleep, or sleeping too much: 0 Feeling tired or having little energy: 0 Poor appetite or overeating (PHQ Adolescent also includes...weight loss): 0 Feeling bad about yourself - or that you are a failure or have let yourself or your family down: 0 Trouble concentrating on things, such as reading the newspaper or watching television (PHQ Adolescent also includes...like school work): 0 Moving or speaking so slowly that other people could have noticed. Or the opposite - being so fidgety or restless that you have been moving around a lot more than usual: 0 Thoughts that you would be better off dead, or of hurting yourself in some way: 0 PHQ-9 Total Score: 0 If you checked off any problems, how difficult have these problems made it for you to do your work, take care of things at home, or get along with other people?: Not difficult at all  Depression Treatment Depression Interventions/Treatment : EYV7-0 Score <4 Follow-up Not Indicated     Goals Addressed             This Visit's Progress    Stay Active and Independent               Objective:    Today's Vitals   12/23/23 1013  BP: 139/72  Pulse: 88  Weight: 225 lb (102.1 kg)  Height: 5' 5 (1.651 m)   Body mass index is 37.44 kg/m.  Hearing/Vision screen Hearing Screening - Comments:: Pt denies Vision Screening - Comments:: Pt vision issues/ wear glasses--pt [facility] in [place], [place]/last ov couple yrs Immunizations and Health Maintenance Health Maintenance  Topic Date Due   COVID-19 Vaccine (3 - 2025-26 season) 09/19/2023   Influenza Vaccine  04/17/2024 (Originally 08/19/2023)   Medicare Annual Wellness (AWV)  12/22/2024   DTaP/Tdap/Td (3 -  Td or Tdap) 01/20/2033   Pneumococcal Vaccine: 50+ Years  Completed   Bone Density Scan  Completed   Zoster Vaccines- Shingrix   Completed   Meningococcal B Vaccine  Aged Out        Assessment/Plan:  This is a routine wellness examination for Anna Bradford.  Patient Care Team: Dettinger, Fonda LABOR, MD as PCP - General (Family Medicine)  I have personally reviewed and noted the following in the patient's chart:   Medical and social history Use of alcohol, tobacco or illicit drugs  Current medications and supplements including opioid prescriptions. Functional ability and status Nutritional status Physical activity Advanced directives List of other physicians Hospitalizations, surgeries, and ER visits in previous 12 months Vitals Screenings to include cognitive, depression, and falls Referrals and appointments  No orders of the defined types were placed in  this encounter.  In addition, I have reviewed and discussed with patient certain preventive protocols, quality metrics, and best practice recommendations. A written personalized care plan for preventive services as well as general preventive health recommendations were provided to patient.   Ozie Ned, CMA   12/23/2023   Return in 1 year (on 12/22/2024).  After Visit Summary: (MyChart) Due to this being a telephonic visit, the after visit summary with patients personalized plan was offered to patient via MyChart   Nurse Notes: n/a

## 2024-01-12 ENCOUNTER — Other Ambulatory Visit: Payer: Self-pay | Admitting: Family Medicine

## 2024-01-12 DIAGNOSIS — M51379 Other intervertebral disc degeneration, lumbosacral region without mention of lumbar back pain or lower extremity pain: Secondary | ICD-10-CM

## 2024-01-12 DIAGNOSIS — M545 Low back pain, unspecified: Secondary | ICD-10-CM

## 2024-01-15 ENCOUNTER — Other Ambulatory Visit: Payer: Self-pay | Admitting: Family Medicine

## 2024-01-15 DIAGNOSIS — I10 Essential (primary) hypertension: Secondary | ICD-10-CM

## 2024-01-15 DIAGNOSIS — N3281 Overactive bladder: Secondary | ICD-10-CM

## 2024-01-20 ENCOUNTER — Ambulatory Visit: Payer: Self-pay

## 2024-01-20 NOTE — Telephone Encounter (Signed)
 Noted has appt Monday

## 2024-01-20 NOTE — Telephone Encounter (Signed)
 Patient is advised Urgent Care and she will try to call her insurance to see about coverage for Urgent Care.  FYI Only or Action Required?: Action required by provider: clinical question for provider, update on patient condition, and Urgent Care recommended--no openings at PCP office--offered an appointment this afternoon with another office  Primary Care but patient did not want to take it bc she didn't know about her new insurance at this time..  Patient was last seen in primary care on 11/28/2023 by Dettinger, Fonda LABOR, MD.  Called Nurse Triage reporting Cough.  Symptoms began several days ago.  Interventions attempted: OTC medications: Mucinex  DM tablets then the liquid with the honey, some diarrhea started, ibuprofen 400mg  in the morning and 400mg  at night, cough drops, throat spray and Rest, hydration, or home remedies.  Symptoms are: gradually worsening.  Triage Disposition: See Physician Within 24 Hours  Patient/caregiver understands and will follow disposition?: Unsure              Copied from CRM #[id number]. Topic: Clinical - Red Word Triage >> Jan 20, 2024  8:27 AM Alfonso ORN wrote: Red Word that prompted transfer to Nurse Triage: productive cough with phelm was colored green has turn to clear and congestion in chest  Patient also experiencing back pain  after getting up and moving around  15 minutes later  having back pain rate 8 , as long as she laying down or sitting don't feel the pain until start moving around  pt had a shot for her back on this pass monday 01/16/24 for her back and stated not sure if its helping    ----------------------------------------------------------------------- From previous Reason for Contact - Call Back - No Documentation: Reason for CRM: patient returning call she received today Reason for Disposition  [1] Continuous (nonstop) coughing interferes with work or school AND [2] no improvement using cough treatment per Care  Advice  Answer Assessment - Initial Assessment Questions Patient states that she has been having back pain--she had an injection on 01/16/2024--she states she doesn't have pain when sitting or standing but if she is standing for 15 minutes the pain will start back. After sitting down her back feels better. Patient also having congestion, sore throat, productive cough, and episodes of back pain, hoarseness,  Patient denies coughing up blood, cardiac history, lung history, fever, difficulty breathing, Patient has been taking Mucinex  DM tablets then the liquid with the honey, some diarrhea started, ibuprofen 400mg  in the morning and 400mg  at night, cough drops, throat spray Patient states she has changed insurance from [employer] to [employer]. Patient doesn't know if this office is in the plan.  She said they didn't clearly answer her and she doesn't have her new card yet. Patient also states that she was on Prednisone  for 14 days after being in the Emergency Room at the beginning of December 12/21/2023. Patient states that she is not hurting but she is just having a lot of productive cough and hoarseness. She states she doesn't feel terrible but her cough and hoarseness are still present.  Patient has an already scheduled follow up appointment with her PCP on Monday 01/23/2023 but is advised with her symptoms it is recommended that she be seen and evaluated for her symptoms within the next day if possible. Patient concerned about what her new insurance [employer] would cover.  She is advised the only appointment open today is at [facility] this afternoon but she declined. She is also advised that Urgent Care  would be recommended as an alternative. She states she may call and see if it is covered with her new insurance. She is encouraged to be seen and evaluated within the next day if possible and to call us  back if needed.  Patient is advised to call us  back if anything changes or with any  further questions/concerns. Patient is advised that if anything worsens to go to the Emergency Room or call 911. Patient verbalized understanding.  Protocols used: Cough - Acute Productive-A-AH

## 2024-01-23 ENCOUNTER — Ambulatory Visit (INDEPENDENT_AMBULATORY_CARE_PROVIDER_SITE_OTHER): Payer: Self-pay | Admitting: Family Medicine

## 2024-01-23 ENCOUNTER — Ambulatory Visit

## 2024-01-23 ENCOUNTER — Encounter: Payer: Self-pay | Admitting: Family Medicine

## 2024-01-23 VITALS — BP 133/74 | HR 114 | Temp 98.3°F | Ht 65.0 in | Wt 215.0 lb

## 2024-01-23 DIAGNOSIS — I10 Essential (primary) hypertension: Secondary | ICD-10-CM | POA: Diagnosis not present

## 2024-01-23 DIAGNOSIS — R110 Nausea: Secondary | ICD-10-CM

## 2024-01-23 DIAGNOSIS — K219 Gastro-esophageal reflux disease without esophagitis: Secondary | ICD-10-CM | POA: Diagnosis not present

## 2024-01-23 DIAGNOSIS — R053 Chronic cough: Secondary | ICD-10-CM

## 2024-01-23 DIAGNOSIS — J683 Other acute and subacute respiratory conditions due to chemicals, gases, fumes and vapors: Secondary | ICD-10-CM

## 2024-01-23 MED ORDER — AMLODIPINE BESYLATE 10 MG PO TABS: 10.0000 mg | ORAL_TABLET | Freq: Every day | ORAL | 3 refills | Status: DC

## 2024-01-23 MED ORDER — AMOXICILLIN-POT CLAVULANATE 875-125 MG PO TABS: 1.0000 | ORAL_TABLET | Freq: Two times a day (BID) | ORAL | 0 refills | Status: AC

## 2024-01-23 MED ORDER — AMLODIPINE BESYLATE 10 MG PO TABS: 10.0000 mg | ORAL_TABLET | Freq: Every day | ORAL | 3 refills | Status: AC

## 2024-01-23 MED ORDER — ALBUTEROL SULFATE HFA 108 (90 BASE) MCG/ACT IN AERS: 2.0000 | INHALATION_SPRAY | Freq: Four times a day (QID) | RESPIRATORY_TRACT | 0 refills | Status: AC | PRN

## 2024-01-23 MED ORDER — FAMOTIDINE 20 MG PO TABS: 20.0000 mg | ORAL_TABLET | Freq: Two times a day (BID) | ORAL | 3 refills | Status: AC

## 2024-01-23 MED ORDER — AMOXICILLIN-POT CLAVULANATE 875-125 MG PO TABS: 1.0000 | ORAL_TABLET | Freq: Two times a day (BID) | ORAL | 0 refills | Status: DC

## 2024-01-23 MED ORDER — FAMOTIDINE 20 MG PO TABS: 20.0000 mg | ORAL_TABLET | Freq: Two times a day (BID) | ORAL | 3 refills | Status: DC

## 2024-01-23 MED ORDER — ALBUTEROL SULFATE HFA 108 (90 BASE) MCG/ACT IN AERS: 2.0000 | INHALATION_SPRAY | Freq: Four times a day (QID) | RESPIRATORY_TRACT | 0 refills | Status: DC | PRN

## 2024-01-23 NOTE — Addendum Note (Signed)
 Addended by: MARYANNE CHEW on: 01/23/2024 04:25 PM   Modules accepted: Orders

## 2024-01-23 NOTE — Progress Notes (Signed)
 "  BP 133/74   Pulse (!) 114   Temp 98.3 F (36.8 C)   Ht 5' 5 (1.651 m)   Wt 215 lb (97.5 kg)   SpO2 97%   BMI 35.78 kg/m    Subjective:   Patient ID: Anna Bradford, female    DOB: May 15, 1933, 89 y.o.   MRN: [id number]  HPI: Anna Bradford is a 89 y.o. female presenting on 01/23/2024 for Medical Management of Chronic Issues, Hypertension, and Cough (Persistent. Request chest xray)   Discussed the use of AI scribe software for clinical note transcription with the patient, who gave verbal consent to proceed.  History of Present Illness   Anna Bradford is an 89 year old female with arthritis and respiratory issues who presents with persistent cough and congestion.  Cough and congestion - Persistent cough and congestion despite prior treatments. - Completed a 14-day course of oral prednisone  (three tablets in the morning and three at lunch daily); initial improvement followed by recurrence of symptoms after completion. - Received a steroid injection on January 23, 2024, without resolution of symptoms.  Respiratory symptoms - Wheezing, especially nocturnal. - Describes breathing as 'like a caterpillar.' - Uncertain if currently has access to Trelegy inhaler; has used inhalers in the past. - No history of tobacco use; husband quit smoking in 1982.  Musculoskeletal pain and mobility impairment - Pain originating in the back and radiating throughout the body. - Difficulty with mobility; previously able to stay up for fifteen minutes after sitting, now unable to do so. - Received an injection in the buttock area (not intra-articular); no joint injections administered. - Confusion regarding lack of improvement in symptoms following injection.      Patient is also coming in for recheck of her chronic issues relating hypertension for which she takes amlodipine  and losartan -hydrochlorothiazide  and also GERD for which she takes Pepcid    Relevant past medical, surgical, family and social  history reviewed and updated as indicated. Interim medical history since our last visit reviewed. Allergies and medications reviewed and updated.  Review of Systems  Constitutional:  Negative for chills and fever.  HENT:  Positive for congestion, postnasal drip and rhinorrhea. Negative for ear discharge, ear pain, sinus pressure, sneezing and sore throat.   Eyes:  Negative for pain, redness and visual disturbance.  Respiratory:  Positive for cough, shortness of breath and wheezing. Negative for chest tightness.   Cardiovascular:  Negative for chest pain and leg swelling.  Genitourinary:  Negative for difficulty urinating and dysuria.  Musculoskeletal:  Positive for arthralgias and back pain.  Skin:  Negative for rash.  Neurological:  Negative for light-headedness and headaches.  Psychiatric/Behavioral:  Negative for agitation and behavioral problems.   All other systems reviewed and are negative.   Per HPI unless specifically indicated above   Allergies as of 01/23/2024       Reactions   Levofloxacin    Other (See Comments) tendonitis   Nitrofurantoin    Other reaction(s): Other (See Comments) Bleeding thru bowels   Macrolides And Ketolides    Bleeding thru bowels   Codeine Nausea And Vomiting   Sulfa Antibiotics Nausea And Vomiting        Medication List        Accurate as of January 23, 2024  4:18 PM. If you have any questions, ask your nurse or doctor.          STOP taking these medications    predniSONE  10 MG (21) Tbpk tablet  Commonly known as: STERAPRED UNI-PAK 21 TAB Stopped by: Fonda Levins, MD       TAKE these medications    rOPINIRole  2 MG tablet Commonly known as: REQUIP  TAKE ONE TABLET AT BEDTIME FOR LEG CRAMPS The timing of this medication is very important.   acetaminophen  325 MG tablet Commonly known as: TYLENOL  Take 650 mg by mouth daily with lunch.   albuterol  108 (90 Base) MCG/ACT inhaler Commonly known as: VENTOLIN  HFA Inhale 2  puffs into the lungs every 6 (six) hours as needed for wheezing or shortness of breath. Started by: Fonda Levins, MD   amitriptyline  50 MG tablet Commonly known as: ELAVIL  TAKE 1 TABLET BY MOUTH AT  BEDTIME   amLODipine  10 MG tablet Commonly known as: NORVASC  Take 1 tablet (10 mg total) by mouth daily.   amoxicillin -clavulanate 875-125 MG tablet Commonly known as: AUGMENTIN  Take 1 tablet by mouth 2 (two) times daily. Started by: Fonda Levins, MD   baclofen  10 MG tablet Commonly known as: LIORESAL  Take 1 tablet (10 mg total) by mouth at bedtime as needed for muscle spasms.   clobetasol  cream 0.05 % Commonly known as: TEMOVATE  Apply topically 2 (two) times daily.   diphenhydrAMINE 25 MG tablet Commonly known as: BENADRYL Take 25 mg by mouth daily as needed for allergies.   famotidine  20 MG tablet Commonly known as: Pepcid  Take 1 tablet (20 mg total) by mouth 2 (two) times daily.   furosemide  20 MG tablet Commonly known as: LASIX  Take 1 tablet (20 mg total) by mouth daily.   lidocaine  5 % Commonly known as: Lidoderm  Place 1 patch onto the skin daily. Remove & Discard patch within 12 hours or as directed by MD   losartan -hydrochlorothiazide  100-25 MG tablet Commonly known as: HYZAAR TAKE 1 TABLET BY MOUTH DAILY   magnesium hydroxide 400 MG/5ML suspension Commonly known as: MILK OF MAGNESIA Take 15 mLs by mouth every other day.   methocarbamol 500 MG tablet Commonly known as: ROBAXIN Take 500 mg by mouth 4 (four) times daily.   multivitamin tablet Take 1 tablet by mouth daily.   nystatin cream Commonly known as: MYCOSTATIN Apply 1 application  topically daily as needed for dry skin.   Potassium 99 MG Tabs Take 99 mg by mouth daily.   solifenacin  5 MG tablet Commonly known as: VESICARE  TAKE 1 TABLET BY MOUTH DAILY   SYSTANE OP Place 1 drop into both eyes daily as needed (dry eyes).   Trelegy Ellipta  100-62.5-25 MCG/ACT Aepb Generic drug:  Fluticasone-Umeclidin-Vilant Inhale 1 puff into the lungs daily.   vitamin C 1000 MG tablet Take 1,000 mg by mouth daily.   Vitamin D -3 25 MCG (1000 UT) Caps Take 2,000 Units by mouth daily.         Objective:   BP 133/74   Pulse (!) 114   Temp 98.3 F (36.8 C)   Ht 5' 5 (1.651 m)   Wt 215 lb (97.5 kg)   SpO2 97%   BMI 35.78 kg/m   Wt Readings from Last 3 Encounters:  01/23/24 215 lb (97.5 kg)  12/23/23 225 lb (102.1 kg)  12/21/23 225 lb 1.4 oz (102.1 kg)    Physical Exam Vitals and nursing note reviewed.  Constitutional:      Appearance: Normal appearance.  Cardiovascular:     Rate and Rhythm: Normal rate and regular rhythm.     Pulses: Normal pulses.     Heart sounds: Normal heart sounds. No murmur heard. Pulmonary:  Breath sounds: Wheezing and rhonchi present. No rales.  Musculoskeletal:        General: Swelling (1+ bilateral lower extremity edema) present.  Neurological:     Mental Status: She is alert.       CHEST: ronchi and wheezing present.       Chest x-ray clear, no acute abnormality  Assessment & Plan:   Problem List Items Addressed This Visit       Cardiovascular and Mediastinum   Essential hypertension, benign   Relevant Medications   amLODipine  (NORVASC ) 10 MG tablet   Other Relevant Orders   CBC With Diff/Platelet   CMP14+EGFR   Lipid panel     Digestive   GERD (gastroesophageal reflux disease)   Relevant Medications   famotidine  (PEPCID ) 20 MG tablet     Other   Severe obesity (BMI 35.0-39.9) with comorbidity (HCC)   Other Visit Diagnoses       Persistent cough    -  Primary   Relevant Medications   albuterol  (VENTOLIN  HFA) 108 (90 Base) MCG/ACT inhaler   amoxicillin -clavulanate (AUGMENTIN ) 875-125 MG tablet   Other Relevant Orders   DG Chest 2 View     Nausea       Relevant Medications   famotidine  (PEPCID ) 20 MG tablet     Reactive airways dysfunction syndrome (HCC)       Relevant Medications   albuterol   (VENTOLIN  HFA) 108 (90 Base) MCG/ACT inhaler   amoxicillin -clavulanate (AUGMENTIN ) 875-125 MG tablet          Reactive airway disease with chronic cough Chronic cough and wheezing likely due to reactive airway disease. No smoking history, but lived with a smoker for 42 years. X-ray shows no pneumonia. Previous inhalers and prednisone  provided partial improvement. - Prescribed Augmentin . - Provided sample of Breztri  inhaler.  Osteoarthritis with lumbar radiculopathy Chronic back pain with lumbar radiculopathy. Recent steroid injection provided temporary relief. Joint injections discussed as a more effective long-term solution. - Discussed potential for future joint injections.  Essential hypertension Hypertension managed with current medications. New insurance provides better pricing for medications. - Continue current antihypertensive regimen.          Follow up plan: No follow-ups on file.  Counseling provided for all of the vaccine components Orders Placed This Encounter  Procedures   DG Chest 2 View   CBC With Diff/Platelet   CMP14+EGFR   Lipid panel    Fonda Levins, MD [facility] 01/23/2024, 4:18 PM     "

## 2024-01-24 MED ORDER — BREZTRI AEROSPHERE 160-9-4.8 MCG/ACT IN AERO: 2.0000 | INHALATION_SPRAY | Freq: Two times a day (BID) | RESPIRATORY_TRACT | Status: AC

## 2024-01-24 NOTE — Addendum Note (Signed)
 Addended by: LEIGH ROSINA SAILOR on: 01/24/2024 07:33 AM   Modules accepted: Orders

## 2024-02-01 ENCOUNTER — Ambulatory Visit: Payer: Self-pay | Admitting: Family Medicine

## 2024-02-02 ENCOUNTER — Other Ambulatory Visit: Payer: Self-pay

## 2024-02-02 DIAGNOSIS — R1011 Right upper quadrant pain: Secondary | ICD-10-CM

## 2024-02-10 ENCOUNTER — Ambulatory Visit (HOSPITAL_COMMUNITY)
Admission: RE | Admit: 2024-02-10 | Discharge: 2024-02-10 | Disposition: A | Source: Ambulatory Visit | Attending: Family Medicine

## 2024-02-10 DIAGNOSIS — R1011 Right upper quadrant pain: Secondary | ICD-10-CM | POA: Insufficient documentation

## 2024-02-13 ENCOUNTER — Ambulatory Visit (HOSPITAL_COMMUNITY): Admission: RE | Admit: 2024-02-13 | Source: Ambulatory Visit

## 2024-02-15 ENCOUNTER — Ambulatory Visit: Payer: Self-pay | Admitting: Family Medicine

## 2024-07-26 ENCOUNTER — Ambulatory Visit: Admitting: Family Medicine

## 2024-12-24 ENCOUNTER — Ambulatory Visit
